# Patient Record
Sex: Female | Born: 1965 | Race: Black or African American | Hispanic: No | Marital: Married | Smoking: Former smoker
Health system: Southern US, Community
[De-identification: ages and names within clinical notes are randomized; demographics above are authoritative.]

## PROBLEM LIST (undated history)

## (undated) DIAGNOSIS — D649 Anemia, unspecified: Secondary | ICD-10-CM

## (undated) DIAGNOSIS — Z803 Family history of malignant neoplasm of breast: Secondary | ICD-10-CM

## (undated) DIAGNOSIS — E785 Hyperlipidemia, unspecified: Secondary | ICD-10-CM

## (undated) DIAGNOSIS — IMO0002 Reserved for concepts with insufficient information to code with codable children: Secondary | ICD-10-CM

## (undated) DIAGNOSIS — Z9289 Personal history of other medical treatment: Secondary | ICD-10-CM

## (undated) DIAGNOSIS — Z801 Family history of malignant neoplasm of trachea, bronchus and lung: Secondary | ICD-10-CM

## (undated) DIAGNOSIS — R7303 Prediabetes: Secondary | ICD-10-CM

## (undated) DIAGNOSIS — D259 Leiomyoma of uterus, unspecified: Secondary | ICD-10-CM

## (undated) DIAGNOSIS — Z01419 Encounter for gynecological examination (general) (routine) without abnormal findings: Secondary | ICD-10-CM

## (undated) DIAGNOSIS — Z86010 Personal history of colonic polyps: Secondary | ICD-10-CM

## (undated) HISTORY — DX: Hyperlipidemia, unspecified: E78.5

## (undated) HISTORY — PX: TUBAL LIGATION: SHX77

## (undated) HISTORY — DX: Family history of malignant neoplasm of breast: Z80.3

## (undated) HISTORY — PX: MYOMECTOMY: SHX85

## (undated) HISTORY — PX: BREAST BIOPSY: SHX20

## (undated) HISTORY — PX: TONSILLECTOMY: SUR1361

## (undated) HISTORY — DX: Leiomyoma of uterus, unspecified: D25.9

## (undated) HISTORY — PX: WISDOM TOOTH EXTRACTION: SHX21

## (undated) HISTORY — DX: Prediabetes: R73.03

## (undated) HISTORY — DX: Reserved for concepts with insufficient information to code with codable children: IMO0002

## (undated) HISTORY — DX: Personal history of colonic polyps: Z86.010

## (undated) HISTORY — DX: Personal history of other medical treatment: Z92.89

## (undated) HISTORY — DX: Encounter for gynecological examination (general) (routine) without abnormal findings: Z01.419

## (undated) HISTORY — DX: Family history of malignant neoplasm of trachea, bronchus and lung: Z80.1

## (undated) HISTORY — PX: ECTOPIC PREGNANCY SURGERY: SHX613

---
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
## 2000-01-21 ENCOUNTER — Other Ambulatory Visit: Admission: RE | Admit: 2000-01-21 | Discharge: 2000-01-21 | Payer: Self-pay | Admitting: Internal Medicine

## 2000-05-14 ENCOUNTER — Emergency Department (HOSPITAL_COMMUNITY): Admission: EM | Admit: 2000-05-14 | Discharge: 2000-05-14 | Payer: Self-pay | Admitting: Emergency Medicine

## 2000-05-25 ENCOUNTER — Encounter: Admission: RE | Admit: 2000-05-25 | Discharge: 2000-07-15 | Payer: Self-pay | Admitting: Orthopedic Surgery

## 2000-06-10 ENCOUNTER — Emergency Department (HOSPITAL_COMMUNITY): Admission: EM | Admit: 2000-06-10 | Discharge: 2000-06-10 | Payer: Self-pay | Admitting: Emergency Medicine

## 2001-05-11 ENCOUNTER — Other Ambulatory Visit: Admission: RE | Admit: 2001-05-11 | Discharge: 2001-05-11 | Payer: Self-pay | Admitting: Family Medicine

## 2001-06-22 ENCOUNTER — Other Ambulatory Visit: Admission: RE | Admit: 2001-06-22 | Discharge: 2001-06-22 | Payer: Self-pay | Admitting: Ophthalmology

## 2001-07-01 ENCOUNTER — Ambulatory Visit (HOSPITAL_COMMUNITY): Admission: RE | Admit: 2001-07-01 | Discharge: 2001-07-01 | Payer: Self-pay | Admitting: Family Medicine

## 2001-07-01 ENCOUNTER — Encounter: Payer: Self-pay | Admitting: Family Medicine

## 2002-12-19 ENCOUNTER — Other Ambulatory Visit: Admission: RE | Admit: 2002-12-19 | Discharge: 2002-12-19 | Payer: Self-pay | Admitting: Family Medicine

## 2002-12-22 ENCOUNTER — Encounter: Payer: Self-pay | Admitting: Family Medicine

## 2002-12-22 ENCOUNTER — Encounter: Admission: RE | Admit: 2002-12-22 | Discharge: 2002-12-22 | Payer: Self-pay | Admitting: Family Medicine

## 2002-12-23 ENCOUNTER — Encounter: Payer: Self-pay | Admitting: *Deleted

## 2002-12-23 ENCOUNTER — Emergency Department (HOSPITAL_COMMUNITY): Admission: EM | Admit: 2002-12-23 | Discharge: 2002-12-23 | Payer: Self-pay | Admitting: Emergency Medicine

## 2005-01-05 ENCOUNTER — Emergency Department (HOSPITAL_COMMUNITY): Admission: EM | Admit: 2005-01-05 | Discharge: 2005-01-05 | Payer: Self-pay | Admitting: Emergency Medicine

## 2005-02-11 ENCOUNTER — Ambulatory Visit: Payer: Self-pay | Admitting: Hospitalist

## 2005-06-18 ENCOUNTER — Ambulatory Visit: Payer: Self-pay | Admitting: Internal Medicine

## 2005-08-19 ENCOUNTER — Ambulatory Visit: Payer: Self-pay | Admitting: Hospitalist

## 2005-09-01 ENCOUNTER — Ambulatory Visit (HOSPITAL_COMMUNITY): Admission: RE | Admit: 2005-09-01 | Discharge: 2005-09-01 | Payer: Self-pay | Admitting: Internal Medicine

## 2005-11-05 ENCOUNTER — Ambulatory Visit: Payer: Self-pay | Admitting: Obstetrics and Gynecology

## 2007-03-13 ENCOUNTER — Emergency Department (HOSPITAL_COMMUNITY): Admission: EM | Admit: 2007-03-13 | Discharge: 2007-03-13 | Payer: Self-pay | Admitting: Emergency Medicine

## 2007-05-13 DIAGNOSIS — Z9289 Personal history of other medical treatment: Secondary | ICD-10-CM

## 2007-05-13 HISTORY — DX: Personal history of other medical treatment: Z92.89

## 2007-08-17 ENCOUNTER — Emergency Department (HOSPITAL_COMMUNITY): Admission: EM | Admit: 2007-08-17 | Discharge: 2007-08-17 | Payer: Self-pay | Admitting: Emergency Medicine

## 2008-07-27 ENCOUNTER — Inpatient Hospital Stay (HOSPITAL_COMMUNITY): Admission: EM | Admit: 2008-07-27 | Discharge: 2008-07-28 | Payer: Self-pay | Admitting: Emergency Medicine

## 2008-08-17 ENCOUNTER — Ambulatory Visit: Payer: Self-pay | Admitting: Obstetrics and Gynecology

## 2008-08-17 ENCOUNTER — Other Ambulatory Visit: Admission: RE | Admit: 2008-08-17 | Discharge: 2008-08-17 | Payer: Self-pay | Admitting: Obstetrics and Gynecology

## 2008-08-17 ENCOUNTER — Encounter (INDEPENDENT_AMBULATORY_CARE_PROVIDER_SITE_OTHER): Payer: Self-pay | Admitting: Family Medicine

## 2008-08-17 LAB — CONVERTED CEMR LAB: RBC: 5.44 M/uL — ABNORMAL HIGH (ref 3.87–5.11)

## 2008-09-20 ENCOUNTER — Ambulatory Visit: Payer: Self-pay | Admitting: Obstetrics & Gynecology

## 2008-12-01 ENCOUNTER — Ambulatory Visit (HOSPITAL_COMMUNITY): Admission: RE | Admit: 2008-12-01 | Discharge: 2008-12-01 | Payer: Self-pay | Admitting: Obstetrics and Gynecology

## 2008-12-01 ENCOUNTER — Encounter (INDEPENDENT_AMBULATORY_CARE_PROVIDER_SITE_OTHER): Payer: Self-pay | Admitting: Obstetrics and Gynecology

## 2009-10-17 ENCOUNTER — Emergency Department (HOSPITAL_COMMUNITY): Admission: EM | Admit: 2009-10-17 | Discharge: 2009-10-17 | Payer: Self-pay | Admitting: Emergency Medicine

## 2009-11-30 ENCOUNTER — Emergency Department (HOSPITAL_COMMUNITY): Admission: EM | Admit: 2009-11-30 | Discharge: 2009-11-30 | Payer: Self-pay | Admitting: Family Medicine

## 2010-04-17 ENCOUNTER — Ambulatory Visit (HOSPITAL_COMMUNITY)
Admission: RE | Admit: 2010-04-17 | Discharge: 2010-04-17 | Payer: Self-pay | Source: Home / Self Care | Attending: Family Medicine | Admitting: Family Medicine

## 2010-08-18 LAB — CBC
HCT: 34.9 % — ABNORMAL LOW (ref 36.0–46.0)
Hemoglobin: 11.9 g/dL — ABNORMAL LOW (ref 12.0–15.0)
MCHC: 34.1 g/dL (ref 30.0–36.0)
MCV: 84.1 fL (ref 78.0–100.0)
Platelets: 281 K/uL (ref 150–400)
RBC: 4.15 MIL/uL (ref 3.87–5.11)
RDW: 13.4 % (ref 11.5–15.5)
WBC: 7.6 K/uL (ref 4.0–10.5)

## 2010-08-21 LAB — POCT PREGNANCY, URINE: Preg Test, Ur: NEGATIVE

## 2010-08-22 LAB — CROSSMATCH: Antibody Screen: NEGATIVE

## 2010-08-22 LAB — COMPREHENSIVE METABOLIC PANEL
ALT: 14 U/L (ref 0–35)
AST: 21 U/L (ref 0–37)
Albumin: 3.7 g/dL (ref 3.5–5.2)
Chloride: 103 mEq/L (ref 96–112)
Creatinine, Ser: 0.59 mg/dL (ref 0.4–1.2)
GFR calc non Af Amer: >60 mL/min (ref >60)
Total Bilirubin: 0.3 mg/dL (ref 0.3–1.2)
Total Protein: 6.7 g/dL (ref 6.0–8.3)

## 2010-08-22 LAB — CBC
HCT: 21.9 % — ABNORMAL LOW (ref 36.0–46.0)
HCT: 27.9 % — ABNORMAL LOW (ref 36.0–46.0)
MCHC: 29.3 g/dL — ABNORMAL LOW (ref 30.0–36.0)
MCHC: 30.8 g/dL (ref 30.0–36.0)
MCHC: 31.3 g/dL (ref 30.0–36.0)
MCV: 61.8 fL — ABNORMAL LOW (ref 78.0–100.0)
MCV: 64.9 fL — ABNORMAL LOW (ref 78.0–100.0)
Platelets: 296 10*3/uL (ref 150–400)
Platelets: 330 10*3/uL (ref 150–400)
Platelets: 378 10*3/uL (ref 150–400)
RBC: 4.26 MIL/uL (ref 3.87–5.11)
RDW: 36.2 % — ABNORMAL HIGH (ref 11.5–15.5)
WBC: 8 10*3/uL (ref 4.0–10.5)

## 2010-08-22 LAB — DIFFERENTIAL
Basophils Absolute: 0 10*3/uL (ref 0.0–0.1)
Basophils Relative: 0 % (ref 0–1)
Eosinophils Absolute: 0.2 10*3/uL (ref 0.0–0.7)
Lymphocytes Relative: 37 % (ref 12–46)
Lymphs Abs: 3 10*3/uL (ref 0.7–4.0)
Monocytes Relative: 8 % (ref 3–12)

## 2010-08-22 LAB — PROTIME-INR
INR: 1 (ref 0.00–1.49)
Prothrombin Time: 13.5 seconds (ref 11.6–15.2)

## 2010-08-22 LAB — POCT URINALYSIS DIP (DEVICE)
Bilirubin Urine: NEGATIVE
Ketones, ur: NEGATIVE mg/dL
Protein, ur: >=300 mg/dL — AB
Specific Gravity, Urine: >=1.03 (ref 1.005–1.030)
Urobilinogen, UA: 1 mg/dL (ref 0.0–1.0)
pH: 6.5 (ref 5.0–8.0)

## 2010-08-22 LAB — URINE MICROSCOPIC-ADD ON

## 2010-08-22 LAB — URINALYSIS, ROUTINE W REFLEX MICROSCOPIC
Bilirubin Urine: NEGATIVE
Ketones, ur: NEGATIVE mg/dL
Nitrite: NEGATIVE
pH: 6.5 (ref 5.0–8.0)

## 2010-08-22 LAB — ABO/RH: ABO/RH(D): O POS

## 2010-08-22 LAB — LIPASE, BLOOD: Lipase: 26 U/L (ref 11–59)

## 2010-08-22 LAB — RETICULOCYTES
Retic Count, Absolute: 99.5 10*3/uL (ref 19.0–186.0)
Retic Ct Pct: 2.5 % (ref 0.4–3.1)

## 2010-08-22 LAB — URINE CULTURE

## 2010-08-22 LAB — POCT PREGNANCY, URINE: Preg Test, Ur: NEGATIVE

## 2010-08-22 LAB — APTT: aPTT: 28 seconds (ref 24–37)

## 2010-09-09 ENCOUNTER — Emergency Department (HOSPITAL_COMMUNITY)
Admission: EM | Admit: 2010-09-09 | Discharge: 2010-09-09 | Disposition: A | Discharge status: Home / Self Care | Payer: Self-pay | Attending: Emergency Medicine | Admitting: Emergency Medicine

## 2010-09-09 ENCOUNTER — Emergency Department (HOSPITAL_COMMUNITY): Payer: Self-pay

## 2010-09-09 DIAGNOSIS — M25519 Pain in unspecified shoulder: Secondary | ICD-10-CM | POA: Insufficient documentation

## 2010-09-09 DIAGNOSIS — M62838 Other muscle spasm: Secondary | ICD-10-CM | POA: Insufficient documentation

## 2010-09-09 DIAGNOSIS — M79609 Pain in unspecified limb: Secondary | ICD-10-CM | POA: Insufficient documentation

## 2010-09-09 DIAGNOSIS — R079 Chest pain, unspecified: Secondary | ICD-10-CM | POA: Insufficient documentation

## 2010-09-09 LAB — DIFFERENTIAL
Basophils Absolute: 0 10*3/uL (ref 0.0–0.1)
Basophils Relative: 0 % (ref 0–1)
Eosinophils Absolute: 0.3 10*3/uL (ref 0.0–0.7)
Eosinophils Relative: 3 % (ref 0–5)

## 2010-09-09 LAB — BASIC METABOLIC PANEL
BUN: 12 mg/dL (ref 6–23)
CO2: 24 mEq/L (ref 19–32)
Calcium: 8.4 mg/dL (ref 8.4–10.5)
GFR calc non Af Amer: >60 mL/min (ref >60)
Glucose, Bld: 119 mg/dL — ABNORMAL HIGH (ref 70–99)
Potassium: 3.8 mEq/L (ref 3.5–5.1)

## 2010-09-09 LAB — CBC
MCHC: 30.7 g/dL (ref 30.0–36.0)
Platelets: 336 10*3/uL (ref 150–400)
RDW: 17.9 % — ABNORMAL HIGH (ref 11.5–15.5)
WBC: 8.7 10*3/uL (ref 4.0–10.5)

## 2010-09-09 LAB — POCT CARDIAC MARKERS
CKMB, poc: <1 ng/mL — ABNORMAL LOW (ref 1.0–8.0)
Myoglobin, poc: 38.1 ng/mL (ref 12–200)

## 2010-09-24 NOTE — Discharge Summary (Signed)
[name], [name]                ACCOUNT NO.:  [id number]   MEDICAL RECORD NO.:  [id number]          PATIENT TYPE:  INP   LOCATION:  5118                         FACILITY:  [facility]   PHYSICIAN:  Eduard Clos, MDDATE OF BIRTH:  [birth year]   DATE OF ADMISSION:  07/26/2008  DATE OF DISCHARGE:  07/28/2008                               DISCHARGE SUMMARY   HISTORY OF PRESENT ILLNESS:  A 45 year old female with known history of  uterine fibroid, menorrhagia, and abdominal cramps.  The patient was  found to have very low hemoglobin of 6.4 with a hematocrit of 21.9.  The  patient was transfused 3 units of blood during stay.  At the time of  discharge, her hemoglobin was 8.7, hematocrit of 27.9.  The patient had  a CAT scan of abdomen and pelvis, and pelvic ultrasound all of which  were consistent with the uterine fibroid.  I did discuss with on-call  gynecologist and Dr. Milus Mallick at [facility] who advised that the  patient will not need immediate surgery, but will need definite follow  up and has given a followup date on April 8 at 2:45 p.m.  and Dr. Milus Mallick  also advised that in case the patient has another vaginal bleeding  diathesis like this, she needs to go to the [facility] ER.  I then  discussed this with the patient.  The patient agreed.  At this time, we  will also discharge patient on ferrous sulfate and tramadol.  Ferrous  sulfate for a possible iron deficiency due to anemia and tramadol for  pain.  At the time of this dictation, the patient is hemodynamically  stable.  The patient's bleeding has stopped at this time.   PROCEDURES DONE DURING THIS STAY:  CT of the abdomen and pelvis on July 26, 2008, shows no acute abdominal findings; mass lesions; or  adenopathy; multiple low-attenuation liver lesions consistent with  benign hepatic cyst; no renal calculi; enlarged uterus with multiple  fibroids; abnormal endometrium, this may be due to the multiple  fibroids;  alopecia; rectal polyps.  Pelvic ultrasound may be helpful.  left ovary, no ureteral or bladder calculi.  Normal apex.  Transvaginal  and pelvic ultrasound on July 27, 2008, shows heterogenous enlarged  uterus with multiple intramural and submucous fibroids, lower uterine  segment, endometrial cavity oval, 3 cm mass suspicious for endometrial  polyp with pedunculated fibroid.  This may be better visualized by  sonohysterography a small left ovarian cyst.   PERTINENT LABS:  Hemoglobin on admission was 6.4, at discharge was 8.7.   MEDICATION AT DISCHARGE:  1. Ferrous sulfate 325 mg p.o. 3 times daily.  2. Tramadol 50 mg p.o. t.i.d. p.r.n. pain.   FINAL DIAGNOSES:  1. 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  2. History of normal pap smear.  3. Menorrhagia.   PLAN:  The patient advised to follow up with [facility] on April 8  at 2:45 p.m.  In the event of any bleeding diathesis, the patient  advised to go to the ER particularly at [facility].  The  patient to follow up with [facility] as scheduled.      Eduard Clos, MD  Electronically Signed     ANK/MEDQ  D:  07/28/2008  T:  07/29/2008  Job:  [id number]

## 2010-09-24 NOTE — Group Therapy Note (Signed)
[name], [name] NO.:  [id number]   MEDICAL RECORD NO.:  [id number]          PATIENT TYPE:  WOC   LOCATION:  [facility]                   FACILITY:  [facility]   PHYSICIAN:  Scheryl Darter, MD       DATE OF BIRTH:  [birth year]   DATE OF SERVICE:  08/17/2008                                  CLINIC NOTE   REASON FOR VISIT:  Evaluation for dysfunctional uterine bleeding.   HISTORY OF PRESENT ILLNESS:  [name] is a 45 year old  gravida 4, para 2-0-2-2 who recently was admitted at [facility]  secondary to iron-deficient anemia with a hemoglobin of 6.4 which was  determined to be related to dysfunctional uterine bleeding and uterine  fibroids.  CT of the abdomen and pelvis as well as pelvic ultrasound  revealed an enlarged uterus at 15 x 8 x 8 cm with multiple fibroids, the  largest 4.4 cm in diameter.  These were intramural and submucosal  fibroids.  The endometrium was 7 mm and there is endometrial cavity  solid oval mass suspicious for a polyp or pedunculated fibroid.  The  ovaries were normal.  The left ovary contains a small dominant follicle.  The patient received transfusion 2 units of blood while at [facility]  and left with a hemoglobin of 9.   HISTORY:  The patient relates having mild cramping with her menstrual  cycles which she describes as normal.  However, she also relates 4-5  days of using six to seven pads a day which she changes and they are  moderately full of blood.  She denies any bleeding in between periods,  any significant abdominal pain.  No fevers or chills and she relates  that her cycles do occur regularly every month.   PAST MEDICAL HISTORY:  None.   PAST SURGICAL HISTORY:  For an ectopic pregnancy.   SOCIAL HISTORY:  She is a smoker.  She is not taking any medications and  the patient relates that aspirin gives her gastric irritation.  She  denies any rash or other reaction to the aspirin.   REVIEW OF SYSTEMS:   Negative other than her history of present illness.   PHYSICAL EXAM:  VITAL SIGNS:  Were reviewed and are normal.  She is  healthy-appearing, no acute distress.  ABDOMEN:  Soft, nontender.  External genitalia are normal.  Vaginal mucosa is normal.  Cervix is  multiparous appearing.   ASSESSMENT/PLAN:  Dysfunctional uterine bleeding likely secondary to  uterine fibroids.  Given the marked drop in her hemoglobin and the  ultrasound findings, recommended to the patient that an endometrial  biopsy be performed.  An informed consent for this procedure was  obtained and is on the chart.  Time-out was conducted.  The cervix was  visualized with speculum and prepped with Betadine.  It was grasped with  a tenaculum and the uterus was sounded to approximately 7 cm.  The  endometrial biopsy pipette was inserted to that distance and appropriate  biopsy was obtained.  The patient tolerated the procedure well.  Blood  loss was minimal.  The  CBC will be done today.  The patient will return  in 2 weeks for discussion of results.  I did offer the patient a Depo  shot today or  potentially even placement of an IUD.  However, the patient declines at  this time and wishes to see how her bleeding continues as well as see  how her hemoglobin holds up  over time.     ______________________________  Odie Sera, D.O.    ______________________________  Scheryl Darter, MD    MC/MEDQ  D:  08/17/2008  T:  08/17/2008  Job:  [id number]

## 2010-09-24 NOTE — H&P (Signed)
[name], Amy                ACCOUNT NO.:  [id number]   MEDICAL RECORD NO.:  [id number]          PATIENT TYPE:  EMS   LOCATION:  [facility]                         FACILITY:  [facility]   PHYSICIAN:  Lonia Blood, M.D.DATE OF BIRTH:  1966-04-11   DATE OF ADMISSION:  07/26/2008  DATE OF DISCHARGE:                              HISTORY & PHYSICAL   PRIMARY CARE PHYSICIAN:  Unassigned.   CHIEF COMPLAINT:  Abdominal pain/cramps.   HISTORY OF PRESENT ILLNESS:  [name] is very pleasant 45-year-  old female who lives in the [place] area.  She does not receive  regular medical care.  She presented to Urgent Care today and was  ultimately transferred to [facility] with complaints of severe  abdominal cramps.  Of note, the patient has a known history of chronic  microcytic anemia due to menorrhagia.  In the past, she was referred to  have an evaluation for possible fibroids, but computerized notes suggest  that there was little suspicion at the time on behalf of the evaluating  physician for unclear reasons.  Ultrasound accomplished in August 2004,  did, however, confirm uterine fibroids.  The patient admits that she has  significant heavy bleeding, at least once a month and at times she will  have bleeding twice a month.  Each episode of bleeding typically last  for 5 days.  She has noticed over the last 2 weeks that she has had  decreased exertional tolerance.  She has felt much more tired.  She is  not, however, had orthostatic symptoms.  She recently finished a  menstrual period on Monday of this week, (it is presently Wednesday).  She noticed that her abdominal cramps with this period were much more  severe than her typical periods.  On Tuesday, she had no cramps, but her  cramps returned today while at work.  These were described as severe  deep cramping pains that shot down through the vagina and into her  legs.  As the day progressed, the pains also shot up into  her abdomen.  The patient denies vaginal discharge.  She denies hematochezia or  melena.  She reports some nausea with these pains, but denies vomiting.  There has been no recent abdominal trauma.  There has been no change in  medications as the patient is on no medications.  There has been no  diarrhea or sick contacts.  There have been no fevers or chills.  On  presentation to the Urgent Care, the patient was found to have a  hemoglobin of 6.4 and was transferred to [facility] for further  evaluation.   REVIEW OF SYSTEMS:  Comprehensive review of systems is unremarkable with  the exception of the multiple positive elements noted in his history of  present illness above.   PAST MEDICAL HISTORY:  1. Abnormal Pap smear, status post cryotherapy multiple years ago.  2. Uterine fibroids noted on ultrasound in August 2004 - the patient      was not aware of this diagnosis.  3. Status post bilateral tubal ligation.  4. History of  ectopic tubal pregnancy, status post surgical excision.  5. Tobacco abuse to a peak of approximately one pack per day since age      of 56 - down to less than a quarter of a pack a day of present      time.  6. History of chronic microcytic anemia, previously instructed to take      iron.  7. Menorrhagia.   OUTPATIENT MEDICATIONS:  None.   ALLERGIES:  ASPIRIN CAUSES GI UPSET.   FAMILY HISTORY:  The patient's mother has diabetes.  Otherwise, the  history is noncontributory.   SOCIAL HISTORY:  The patient occasionally partakes of alcohol, but not  to excess.  She is engaged.  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   DATA REVIEW:  Urine pregnancy test is negative.  Urinalysis reveals  large hemoglobin and greater than 300 protein.  White count is normal,  platelet count is normal.  Hemoglobin is 6.4 with an MCV of 55.  Sodium,  potassium chloride, bicarb, BUN and creatinine and glucose are normal.  Calcium is normal.  LFTs are all normal.   Albumin is 3.7, lipase is 26.  Coags have not been checked.  CT scan of the abdomen and pelvis reveals  no acute abdominal findings with no renal calculi and multiple hepatic  cysts that appear to be benign.  There are multiple uterine fibroids on  CT scan of the pelvis and also an abnormal endometrium with suggestion  for further evaluation via pelvic ultrasound.  It is common and the  appendix is normal.   PHYSICAL EXAMINATION:  VITAL SIGNS:  Temperature 98.1, blood pressure  116/63, heart rate 68, respiratory 16.  O2 saturation 100% on room air.  GENERAL:  A well-developed, overweight female in no acute respiratory  distress.  HEENT:  Normocephalic, atraumatic.  Pupils equal, round and reactive to  light and accommodation.  Extraocular muscles intact.  OC/OP clear.  NECK:  No JVD.  LUNGS:  Clear to auscultation bilaterally without wheezes or rhonchi.  CARDIOVASCULAR:  Regular rate and rhythm without murmur, gallop or rub.  Normal S1-S2.  ABDOMEN:  Obese, soft, nondistended.  Tender to palpation throughout all  four quadrants.  No rebound, no appreciable ascites.  EXTREMITIES:  1+ bilateral lower extremity edema without cyanosis or  clubbing.  NEUROLOGIC:  Alert and oriented x4.  Cranial nerves II-XII intact.  Nonfocal neurologic exam.   IMPRESSION AND PLAN:  1. Subacute/acute blood loss anemia - the clear etiology here appears      to be the patient's menorrhagia.  This may be driven by her      fibroids.  I am also worried about the comment of an abnormal      endometrium on her CT scan of the abdomen.  The patient will be      typed and crossed and we will initiate her treatment with two units      of packed red blood cells.  We will then follow up with a CBC and      consider further transfusion as indicated.  I suspect that the      patient will feel significantly better with simple transfusion.  We      will follow her CBCs on q.6 h., basis.  Once her hemoglobin is at 8       or greater, we will monitor without further transfusion.  An anemia      is being sent to confirm her  iron deficiency, but with an MCV of      55, hemoglobin of 6.4 and a history of menorrhagia, this is      extremely clear.  I will check coags to rule out any coagulopathy      that has previously been undiagnosed.  2. Abdominal cramps - I am concerned these are related to the      patient's fibroids/abnormal endometrium.  These are also likely      playing a role in her blood loss.  Additionally, in a patient with      a history of an abnormal Pap and abnormal endometrium via CT scan      is quite concerning.  We will obtain a pelvic and transvaginal      ultrasound to further evaluate the abnormal endometrium.  We will      consider inpatient gynecology consultation at [facility].  We will      also consider the possibility, however, of transfusing the patient      to the point that she is medically stable and then sending her to      see a gynecologist as an outpatient with an early appointment being      arranged.  The timing of this will be based upon the results of her      ultrasound and her clinical status.  3. Tobacco abuse - at the present time the patient's tobacco use is so      minimal that a nicotine patch is not indicated.  I have encouraged      her to go ahead and discontinue smoking altogether.  I will request      tobacco cessation consultation to help drive this point home.      Lonia Blood, M.D.  Electronically Signed     JTM/MEDQ  D:  07/26/2008  T:  07/27/2008  Job:  [id number]

## 2010-09-24 NOTE — H&P (Signed)
[name], [name]       ACCOUNT NO.:  [id number]   MEDICAL RECORD NO.:  [id number]          PATIENT TYPE:  AMB   LOCATION:  [facility]                           [facility]   PHYSICIAN:  Naima A. Dillard, M.D. DATE OF BIRTH:  July 17, 1965   DATE OF ADMISSION:  DATE OF DISCHARGE:                              HISTORY & PHYSICAL   CHIEF COMPLAINT:  Irregular vaginal bleeding with 3 uterine fibroids, 3  are submucosal.   HISTORY:  The patient is a 45 year old African American female, who  presented to me in June 2010.  [place] Spaniel that she was found to have anemia in  March and was hospitalized and given 2 units of packed red blood cells.  States her last hemoglobin was 10.0.  She has irregular spotting and  vaginal bleeding, and had ultrasound which was significant for uterus  measuring 15.2 x 8.7 x 8.2 with a questionable endometrial polyp with  several fibroids.  The patient was seen by faculty practice and had an  endometrial biopsy and was given Provera.  The Provera made her sick and  endometrial biopsy was benign.   PAST MEDICAL HISTORY:  Significant for anemia and above.   MEDICATIONS:  Iron pill.   ALLERGIES:  The patient is allergic to ASPIRIN.   PAST SURGICAL HISTORY:  Significant for exploratory laparotomy and  salpingectomy secondary to tubal pregnancy in 1990, and she had elective  abortion in the past.   SOCIAL HISTORY:  The patient smokes 3-4 cigarettes a day and occasional  alcohol.  No illicit drug use.   FAMILY HISTORY:  Significant for thyroid disease, cancer, and diabetes.   PHYSICAL EXAMINATION:  VITAL SIGNS:  The patient weighs 185 pounds,  pulse is 64, blood pressure is 114/70.  HEENT:  Pupils are equal.  Hearing is normal.  Throat is clear.  Thyroid  is not enlarged.  HEART:  Regular rate and rhythm.  LUNGS:  Clear to auscultation bilaterally.  BREASTS:  No masses, discharge, skin changes, or nipple retractions  bilaterally.  BACK:  No CVA  tenderness.  ABDOMEN:  Nontender without mass or organomegaly.  EXTREMITIES:  No cyanosis, clubbing, or edema.  NEUROLOGIC:  Within normal limits.  VAGINAL:  Within normal limits.  Cervix is nontender without any  lesions.  Uterus is 13 weeks size and irregular.  Adnexa has no masses.  The patient had a sonohystogram which showed her uterus to measure 7.4 x  11.4 x 8.3 with several fibroids, and on the ultrasound, there were 3  submucosal fibroids seen measuring 2.37 x 2.49, 2.6 x 2.5, and 2.7 x  2.4.   ASSESSMENT:  Uterine fibroids and anemia.  All treatment for fibroids  were reviewed with the patient.  The patient desires to try hysteroscopy  in hopes that the submucosal fibroids are the cause of her irregular  bleeding and anemia.  She understands that she has other uterine  fibroids and this may not work.  She also understands that she may need  more than 1 surgery because sometimes we are unable to remove all the  fibroids in one surgery.  She understands the risk of  the surgery to be  but not limited to bleeding, infection, damage to internal organs such  as bowel, bladder, major blood vessels.  She understands that she can do  observation, uterine artery embolization, hysterectomy, myomectomy, and  Lupron.  The patient has decided to proceed.      Naima A. Normand Sloop, M.D.  Electronically Signed     NAD/MEDQ  D:  11/29/2008  T:  11/30/2008  Job:  [id number]

## 2010-09-24 NOTE — Op Note (Signed)
NAMECAYLI, [name]       ACCOUNT NO.:  [id number]   MEDICAL RECORD NO.:  [id number]          PATIENT TYPE:  AMB   LOCATION:  [facility]                           [facility]   PHYSICIAN:  Naima A. Dillard, M.D. DATE OF BIRTH:  17-Mar-1966   DATE OF PROCEDURE:  12/01/2008  DATE OF DISCHARGE:  12/01/2008                               OPERATIVE REPORT   PREOPERATIVE DIAGNOSES:  Symptomatic uterine fibroids and anemia.   POSTOPERATIVE DIAGNOSES:  Symptomatic uterine fibroids and anemia with 2  submucosal fibrous tissue.   PROCEDURE:  Dilation and curettage, hysteroscopy, removal of submucosal  fibroids.   SURGEON:  Naima A. Dillard, MD   ASSISTANTS:  None.   ANESTHESIA:  General laryngeal mask airway.   FINDINGS:  A 2 large anterior submucosal fibroids.  The uterus under  pelvic exam was about 14 weeks' size and mobile.  No adnexal masses are  palpated.  Endometrial curettings and fibroids sent to Pathology.  There  were no polyps seen.  No other fibroids seen.   ESTIMATED BLOOD LOSS:  Minimal.   NORMAL SALINE DEFICIT:  493 mL.   COMPLICATIONS:  None.   The patient went to PACU in stable condition.   PROCEDURE IN DETAIL:  The patient was taken to the operating room where  she was given general anesthesia, and placed in dorsal lithotomy  position, and prepped and draped in a normal sterile fashion.  A red  rubber catheter was placed into the bladder and the bladder was drained.  A bivalve speculum was placed into the vagina.  The anterior lip of the  cervix was grasped with a single-tooth tenaculum.  After her exam under  anesthesia, the cervix was dilated up to 31.  The hysteroscope was  placed into the uterine cavity using the loop and we could see 1 large  submucosal fibroid.  I then cut some chips back to sent to Pathology and  then tried to desiccate the rest of the fibroid, but loop did not  desiccate the fibroid barely even desiccate any tissue.  So then we  switched to the razor which still desiccate some tissue, but not much as  well as I switched back to the loop and then just removed the each part  of fibroid chip by chip as I could and then I went in and removed the  hysteroscope, went in with the polyp forceps and was able to remove the  remainder of the fibroid.  The second fibroid was done in a similar  fashion.  At the end of the case, the chips started again and we tried  to removed as many chips as I could, but the visualization was obscured  and I could not see all of the second fibroid to be removed, so I say  about a quarter of the fibroid was less because of visualization issue I  stopped.  There was some bleeding noted in the anterior wall on the  patient's right side which was hemostatic with the Bovie coag.  No  perforations were noted.  No more fibroids or polyps were seen.  No  other pathology was seen.  The hysteroscope was then removed.  A sharp  curettage was then done and a scant amount of endometriosis, curettings  were obtained.  I then looked again with the hysteroscope, there was no  heavy bleeding.  All instruments were removed from the abdomen.  Sponge,  lap, and needle counts were correct.  The VersaPoint was there and he  cannot understand why the tissue would not desiccate either.  The  tenaculum site was made hemostatic with pressure and silver nitrate.  Sponge, lap, and needles counts were correct.  The patient went to  recovery room in stable condition.      Naima A. Normand Sloop, M.D.  Electronically Signed     NAD/MEDQ  D:  12/01/2008  T:  12/02/2008  Job:  [id number]

## 2010-09-27 NOTE — Group Therapy Note (Signed)
NAMEGIORGIA, WAHLER              ACCOUNT NO.:  [id number]   MEDICAL RECORD NO.:  [id number]          PATIENT TYPE:  WOC   LOCATION:  [facility]                   FACILITY:  [facility]   PHYSICIAN:  Argentina Donovan, MD        DATE OF BIRTH:  1966/01/21   DATE OF SERVICE:                                    CLINIC NOTE   CHIEF COMPLAINT:  Anemia.   SUBJECTIVE:  [name] is a 45 year old African-American female, new  patient to our clinic, who was referred from the outpatient clinic, internal  medicine, at [facility] for chronic microcytic anemia workup to  rule out possible fibroids.  The patient does not remember her hemoglobin  level and said that she feels tired sometimes.  Denies any palpitations,  chest pain, or shortness of breath.   PAST MEDICAL HISTORY:  Anemia.   MEDICATIONS:  Iron sulfate, which the patient takes already.   ALLERGIES:  ASPIRIN.   History of ulcer.  __________ .   GYNECOLOGIC HISTORY:  Patient's menarche was at 45 years old.  Her last  menstrual period was June 18th.  She usually has regular cycles lasting five  days without any bleeding between.  During days 1-2, patient wears five  pads, regular flow, and on days 3-5, may wear 3-4 pads, regular flow also.  She may have mild cramping with periods.  She is currently sexually active.  Does not use any contraception because she has had a bilateral tubal  ligation.  She is G4, para 2, with one miscarriage and one ectopic  pregnancy.  Her last pregnancy was 15 years ago.  Two spontaneous vaginal  deliveries.  Also a history of yearly Pap smear with abnormal Pap smear 20  years ago.  Did require cryotherapy.  Last Pap smear in April, 2006, normal.  Last mammogram three weeks ago, which was normal.   PAST SURGICAL HISTORY:  Ectopic pregnancy in 1991 and bilateral tubal  ligation.   FAMILY HISTORY:  Mother with diabetes.  Grandmother with hypertension.   SOCIAL HISTORY:  The patient has smoked since  she was 45 years old.  Has  been cutting down to one pack per week.  She drinks alcohol socially.  No  caffeinated beverage and no drug use.  She has never been sexually or  physically abused.   LABORATORY DATA:  From outpatient records, August 19, 2005 shows hemoglobin  8.1, MCV 65.5, RDW 18.9, iron 11 with a total iron-binding capacity  decreased and iron percent sat 3, ferritin 2.   PHYSICAL EXAMINATION:  VITAL SIGNS:  Blood pressure 101/67, pulse 74,  temperature 98.3.  Weight 156 pounds __________ .  Height 5.5-3/4 inch.  GENERAL:  Patient was in no acute distress.  HEENT:  Normal colored conjunctivae.  LUNGS:  Clear to auscultation bilaterally.  CARDIOVASCULAR:  S1 and S2 normal.  No murmurs, rubs or gallops.  ABDOMEN:  Positive bowel sounds.  Nontender.  Nondistended.  GENITOURINARY:  __________  entirely within normal limits.  Bimanual  examination shows normal uterus size.  No cervical motion tenderness.  No  uterine or adnexal masses  palpable.   ASSESSMENT:  1.  Chronic microcytic anemia, iron deficiency.  At this point, since we got      the recent blood work that shows iron deficiency etiology, I will      strongly recommend the patient to follow iron sulfate therapy.  I will      repeat CBC today and check reticulocyte count.  Patient should take her      sulfate 625 mg b.i.d. for two months.  We will recheck CBC and retic      count in six weeks and discuss with the patient at that point.  Also, we      will check Hemoccult stools and peripheral blood smear, which has not      been checked yet, in the outpatient clinic.  2.  Patient has regular periods.  We do not think she has any fibroids, so      at this point, we defer any further evaluation on this __________ .     ______________________________  Argentina Donovan, MD    ______________________________  Argentina Donovan, MD    PR/MEDQ  D:  11/05/2005  T:  11/05/2005  Job:  [id number]

## 2011-04-10 ENCOUNTER — Other Ambulatory Visit (HOSPITAL_COMMUNITY): Payer: Self-pay | Admitting: Family Medicine

## 2011-04-30 ENCOUNTER — Other Ambulatory Visit (HOSPITAL_COMMUNITY): Payer: Self-pay | Admitting: Family Medicine

## 2011-04-30 DIAGNOSIS — Z1231 Encounter for screening mammogram for malignant neoplasm of breast: Secondary | ICD-10-CM

## 2011-05-12 ENCOUNTER — Emergency Department (HOSPITAL_COMMUNITY): Payer: Self-pay

## 2011-05-12 ENCOUNTER — Encounter: Payer: Self-pay | Admitting: *Deleted

## 2011-05-12 ENCOUNTER — Emergency Department (HOSPITAL_COMMUNITY)
Admission: EM | Admit: 2011-05-12 | Discharge: 2011-05-13 | Disposition: A | Discharge status: Home / Self Care | Payer: Self-pay | Attending: Emergency Medicine | Admitting: Emergency Medicine

## 2011-05-12 ENCOUNTER — Other Ambulatory Visit: Payer: Self-pay

## 2011-05-12 DIAGNOSIS — R51 Headache: Secondary | ICD-10-CM | POA: Insufficient documentation

## 2011-05-12 DIAGNOSIS — F17200 Nicotine dependence, unspecified, uncomplicated: Secondary | ICD-10-CM | POA: Insufficient documentation

## 2011-05-12 DIAGNOSIS — D649 Anemia, unspecified: Secondary | ICD-10-CM | POA: Insufficient documentation

## 2011-05-12 DIAGNOSIS — R0602 Shortness of breath: Secondary | ICD-10-CM | POA: Insufficient documentation

## 2011-05-12 DIAGNOSIS — R079 Chest pain, unspecified: Secondary | ICD-10-CM | POA: Insufficient documentation

## 2011-05-12 LAB — BASIC METABOLIC PANEL
BUN: 11 mg/dL (ref 6–23)
Chloride: 102 mEq/L (ref 96–112)
Creatinine, Ser: 0.64 mg/dL (ref 0.50–1.10)
GFR calc Af Amer: >90 mL/min (ref >90)
Glucose, Bld: 76 mg/dL (ref 70–99)
Potassium: 3.5 mEq/L (ref 3.5–5.1)

## 2011-05-12 LAB — CARDIAC PANEL(CRET KIN+CKTOT+MB+TROPI)
Relative Index: 1 (ref 0.0–2.5)
Troponin I: <0.3 ng/mL (ref <0.30)

## 2011-05-12 LAB — CBC
HCT: 32.3 % — ABNORMAL LOW (ref 36.0–46.0)
Hemoglobin: 10.2 g/dL — ABNORMAL LOW (ref 12.0–15.0)
MCV: 73.7 fL — ABNORMAL LOW (ref 78.0–100.0)
RDW: 13.9 % (ref 11.5–15.5)
WBC: 8.6 10*3/uL (ref 4.0–10.5)

## 2011-05-12 MED ORDER — OXYCODONE-ACETAMINOPHEN 5-325 MG PO TABS
1.0000 | ORAL_TABLET | Freq: Once | ORAL | Status: AC
  Administered 2011-05-12: 1 via ORAL
  Filled 2011-05-12: qty 1

## 2011-05-12 MED ORDER — OXYCODONE-ACETAMINOPHEN 5-325 MG PO TABS: 2.0000 | ORAL_TABLET | ORAL | Status: AC | PRN

## 2011-05-12 NOTE — ED Provider Notes (Addendum)
History     CSN: [id number]  Arrival date & time 05/12/11  7829   First MD Initiated Contact with Patient 05/12/11 2200      Chief Complaint  Patient presents with  . Headache  . Chest Pain    worse when lying flat or changing position   patient presents with persistent sharp pain in the mid chest area over the past 24-36 hours. She denies any change in pain with eating. She did eat this morning. She's had minimal if any shortness of breath. No nausea, vomiting. No cough or fever. No back or abdominal pain. Denies any leg pain or swelling. She takes Tylenol for the chest pain, which helped somewhat. She denies any previous cardiac history or any family history of premature heart disease. Denies any change of pain with exertion. It is worse with palpation or certain positions. Patient also states she has had a chronic headache for 2-3 weeks however, if the headache. She states usually is helped with Tylenol. She has had no neurologic symptoms. Denies any dizziness, vomiting, numbness, tingling, or weakness, ataxia.  No neck pain or fever.   Patient was apparently seen last April for similar symptoms and had normal testing at that time.  Denies any anxiety or stressors. Denies any pleuritic pain or any recent surgeries or procedures.  (Consider location/radiation/quality/duration/timing/severity/associated sxs/prior treatment) HPI  No past medical history on file.  Past Surgical History  Procedure Date  . Myomectomy   . Ectopic pregnancy surgery     No family history on file.  History  Substance Use Topics  . Smoking status: Current Everyday Smoker -- 0.5 packs/day    Types: Cigarettes  . Smokeless tobacco: Not on file  . Alcohol Use: Yes    OB History    Grav Para Term Preterm Abortions TAB SAB Ect Mult Living                  Review of Systems  All other systems reviewed and are negative.    Allergies  Aspirin  Home Medications  No current outpatient prescriptions  on file.  BP 120/64  Pulse 69  Temp(Src) 98.8 F (37.1 C) (Oral)  Resp 16  SpO2 99%  LMP 04/19/2011  Physical Exam  Nursing note and vitals reviewed. Constitutional: She is oriented to person, place, and time. She appears well-developed and well-nourished. No distress.  HENT:  Head: Normocephalic and atraumatic.  Mouth/Throat: Oropharynx is clear and moist.  Eyes: Conjunctivae and EOM are normal. Pupils are equal, round, and reactive to light.  Neck: Neck supple. No thyromegaly present.  Cardiovascular: Normal rate and regular rhythm.  Exam reveals no gallop and no friction rub.   No murmur heard. Pulmonary/Chest: Effort normal and breath sounds normal. No respiratory distress. She has no wheezes. She has no rales. She exhibits no tenderness.  Abdominal: Soft. Bowel sounds are normal. She exhibits no distension and no mass. There is no tenderness. There is no rebound and no guarding.  Musculoskeletal: Normal range of motion. She exhibits no edema and no tenderness.  Neurological: She is alert and oriented to person, place, and time. She displays normal reflexes. No cranial nerve deficit. She exhibits normal muscle tone. Coordination normal.  Skin: Skin is warm and dry. No rash noted.  Psychiatric: She has a normal mood and affect.    ED Course  Procedures (including critical care time)  Labs Reviewed  CBC - Abnormal; Notable for the following:    Hemoglobin 10.2 (*)  HCT 32.3 (*)    MCV 73.7 (*)    MCH 23.3 (*)    All other components within normal limits  CARDIAC PANEL(CRET KIN+CKTOT+MB+TROPI) - Abnormal; Notable for the following:    Total CK 255 (*)    All other components within normal limits  BASIC METABOLIC PANEL  POCT PREGNANCY, URINE   Dg Chest 2 View  05/12/2011  *RADIOLOGY REPORT*  Clinical Data: Chest pain.  Tobacco use.  CHEST - 2 VIEW  Comparison: 09/09/2010  Findings: Cardiac and mediastinal contours appear normal.  The lungs appear clear.  No pleural  effusion is identified.  IMPRESSION:  No significant abnormality identified.  Original Report Authenticated By: Dellia Cloud, M.D.     1. Chest pain   2. Anemia       MDM  Pt is seen and examined;  Initial history and physical completed.  Will follow.    Date: 05/12/2011  Rate: 63  Rhythm: normal sinus rhythm  QRS Axis: normal  Intervals: normal  ST/T Wave abnormalities: normal  Conduction Disutrbances:none  Narrative Interpretation:   Old EKG Reviewed: unchanged    11:33 PM  PERC score is Negative.. Low pretest probability, very low clinical suspicion for PE.   Results for orders placed during the hospital encounter of 05/12/11  CBC      Component Value Range   WBC 8.6  4.0 - 10.5 (K/uL)   RBC 4.38  3.87 - 5.11 (MIL/uL)   Hemoglobin 10.2 (*) 12.0 - 15.0 (g/dL)   HCT 16.1 (*) 09.6 - 46.0 (%)   MCV 73.7 (*) 78.0 - 100.0 (fL)   MCH 23.3 (*) 26.0 - 34.0 (pg)   MCHC 31.6  30.0 - 36.0 (g/dL)   RDW 04.5  40.9 - 81.1 (%)   Platelets 350  150 - 400 (K/uL)  BASIC METABOLIC PANEL      Component Value Range   Sodium 137  135 - 145 (mEq/L)   Potassium 3.5  3.5 - 5.1 (mEq/L)   Chloride 102  96 - 112 (mEq/L)   CO2 25  19 - 32 (mEq/L)   Glucose, Bld 76  70 - 99 (mg/dL)   BUN 11  6 - 23 (mg/dL)   Creatinine, Ser 9.14  0.50 - 1.10 (mg/dL)   Calcium 9.0  8.4 - 78.2 (mg/dL)   GFR calc non Af Amer >90  >90 (mL/min)   GFR calc Af Amer >90  >90 (mL/min)  CARDIAC PANEL(CRET KIN+CKTOT+MB+TROPI)      Component Value Range   Total CK 255 (*) 7 - 177 (U/L)   CK, MB 2.6  0.3 - 4.0 (ng/mL)   Troponin I <0.30  <0.30 (ng/mL)   Relative Index 1.0  0.0 - 2.5    Dg Chest 2 View  05/12/2011  *RADIOLOGY REPORT*  Clinical Data: Chest pain.  Tobacco use.  CHEST - 2 VIEW  Comparison: 09/09/2010  Findings: Cardiac and mediastinal contours appear normal.  The lungs appear clear.  No pleural effusion is identified.  IMPRESSION:  No significant abnormality identified.  Original Report  Authenticated By: Dellia Cloud, M.D.        11:33 PM Patient is feeling much better. Chest pain, appears to be  very atypical, reproducible, there is low suspicion for ACS or cardiac etiology. We'll prescribe pain medication and recommend close followup with primary care physician this week. Patient was also encouraged to return to the ED for any concerns or changing symptoms. Initially, urine test was ordered, however,  patient did not want to have that done, and we'll cancel it and discharge her home to     [facility]. Patrica Duel, MD 05/12/11 2334  Lorelle Gibbs. Patrica Duel, MD 05/12/11 2342

## 2011-05-12 NOTE — ED Notes (Signed)
Pt presents with a c/c of chest pain that started last night.  St's she was getting in her car when the pain started, st's she took a tylenol but that didn't help.  St's nothing has helped the pain but movement makes it worse, pain does not radiate.  Pt st's she becomes SOB upon movement, denies any lightheadedness

## 2011-05-12 NOTE — ED Notes (Signed)
Pt reports headache x 2-3 weeks, intermittently. Pt states tylenol eventually helps headache. Pt c/o midchest pain reproducible w/ movement and position change, describes as intermittent and sharp, when lying it is pressure. Pt denies prior hx of same and denies cardiac hx for self and family.

## 2011-05-12 NOTE — ED Notes (Signed)
Light green and lav sent to lab, dark green in mini lab.

## 2011-06-03 ENCOUNTER — Ambulatory Visit (HOSPITAL_COMMUNITY)
Admission: RE | Admit: 2011-06-03 | Discharge: 2011-06-03 | Disposition: A | Discharge status: Home / Self Care | Payer: No Typology Code available for payment source | Source: Ambulatory Visit | Attending: Family Medicine | Admitting: Family Medicine

## 2011-06-03 DIAGNOSIS — Z1231 Encounter for screening mammogram for malignant neoplasm of breast: Secondary | ICD-10-CM

## 2011-11-26 ENCOUNTER — Encounter: Payer: Self-pay | Admitting: Physician Assistant

## 2011-11-26 ENCOUNTER — Ambulatory Visit: Payer: Self-pay | Admitting: Physician Assistant

## 2011-11-26 VITALS — BP 120/68 | HR 70 | Temp 99.0°F | Resp 16 | Ht 66.0 in | Wt 200.4 lb

## 2011-11-26 DIAGNOSIS — J019 Acute sinusitis, unspecified: Secondary | ICD-10-CM

## 2011-11-26 DIAGNOSIS — J029 Acute pharyngitis, unspecified: Secondary | ICD-10-CM

## 2011-11-26 DIAGNOSIS — R05 Cough: Secondary | ICD-10-CM

## 2011-11-26 MED ORDER — IPRATROPIUM BROMIDE 0.03 % NA SOLN: 2.0000 | Freq: Two times a day (BID) | NASAL | Status: DC

## 2011-11-26 MED ORDER — CEFDINIR 300 MG PO CAPS: 600.0000 mg | ORAL_CAPSULE | Freq: Every day | ORAL | Status: AC

## 2011-11-26 MED ORDER — HYDROCOD POLST-CHLORPHEN POLST 10-8 MG/5ML PO LQCR: 5.0000 mL | Freq: Two times a day (BID) | ORAL | Status: DC | PRN

## 2011-11-26 MED ORDER — FLUCONAZOLE 150 MG PO TABS: 150.0000 mg | ORAL_TABLET | Freq: Once | ORAL | Status: AC

## 2011-11-26 NOTE — Patient Instructions (Signed)
Get plenty of rest and drink at least 64 ounces of water daily. Continue the Mucinex Maximum Strength. Stop the pseudofed.

## 2011-11-26 NOTE — Progress Notes (Signed)
Subjective:    Patient ID: [name], female    DOB: 05-Mar-1966, 46 y.o.   MRN: [id number]  HPI This 46 y.o. Female presents for evaluation of worsening cough, congestion.  Illness began with a sore throat.  Seen at another facility, where strep test and throat culture were negative.  Cough is non-productive, though she can feel a burning sensation in the chest and feels like the phlegm is moving when she coughs.  Subjective fever/chills. Reports bad taste, HA (worse with coughing, and can cause gagging and vomiting), myalgias.  Review of Systems As above.  History reviewed. No pertinent past medical history.  Prior to Admission medications   Medication Sig Start Date End Date Taking? Authorizing Provider  acetaminophen (TYLENOL) 500 MG tablet Take 500 mg by mouth every 6 (six) hours as needed.   Yes Historical Provider, MD  dextromethorphan-guaiFENesin (MUCINEX DM) 30-600 MG per 12 hr tablet Take 1 tablet by mouth every 12 (twelve) hours.   Yes Historical Provider, MD  pseudoephedrine (SUDAFED) 30 MG tablet Take 30 mg by mouth every 4 (four) hours as needed.   Yes Historical Provider, MD    Allergies  Allergen Reactions  . Aspirin Nausea Only   History   Social History  . Marital Status: Single-engaged    Number of Children: 2 adult daughters   Social History Main Topics  . Smoking status: Current Everyday Smoker -- 0.2 packs/day    Types: Cigarettes  . Smokeless tobacco: Never Used  . Alcohol Use: 0.0 oz/week    0-1 drink(s) per week  . Drug Use: No  . Sexually Active: Yes    Birth Control/ Protection: Surgical, None   Family History  Problem Relation Age of Onset  . Hypertension Mother   . Hepatitis Mother     Hepatitis C  . Asthma Sister       Objective:   Physical Exam  Vitals reviewed. Constitutional: She is oriented to person, place, and time. Vital signs are normal. She appears well-developed and well-nourished. She is active.  Non-toxic  appearance. She does not have a sickly appearance. She appears ill. No distress.  HENT:  Head: Normocephalic and atraumatic.  Right Ear: Hearing, tympanic membrane, external ear and ear canal normal.  Left Ear: Hearing, tympanic membrane, external ear and ear canal normal.  Nose: Mucosal edema and rhinorrhea present.  No foreign bodies. Right sinus exhibits no maxillary sinus tenderness and no frontal sinus tenderness. Left sinus exhibits no maxillary sinus tenderness and no frontal sinus tenderness.  Mouth/Throat: Uvula is midline, oropharynx is clear and moist and mucous membranes are normal. No uvula swelling. No oropharyngeal exudate.       Frontal and maxillary sinus tenderness  Eyes: Conjunctivae and EOM are normal. Pupils are equal, round, and reactive to light. Right eye exhibits no discharge. Left eye exhibits no discharge. No scleral icterus.  Neck: Trachea normal, normal range of motion and full passive range of motion without pain. Neck supple. No mass and no thyromegaly present.  Cardiovascular: Normal rate, regular rhythm and normal heart sounds.   Pulmonary/Chest: Effort normal and breath sounds normal.  Lymphadenopathy:       Head (right side): No submandibular, no tonsillar, no preauricular, no posterior auricular and no occipital adenopathy present.       Head (left side): No submandibular, no tonsillar, no preauricular and no occipital adenopathy present.    She has no cervical adenopathy.       Right: No supraclavicular adenopathy present.  Left: No supraclavicular adenopathy present.  Neurological: She is alert and oriented to person, place, and time. She has normal strength. No cranial nerve deficit or sensory deficit.  Skin: Skin is warm, dry and intact. No rash noted.  Psychiatric: She has a normal mood and affect. Her speech is normal and behavior is normal.      Assessment & Plan:   1. Acute sinusitis, unspecified  cefdinir (OMNICEF) 300 MG capsule, ipratropium  (ATROVENT) 0.03 % nasal spray, fluconazole (DIFLUCAN) 150 MG tablet  2. Cough  chlorpheniramine-HYDROcodone (TUSSIONEX PENNKINETIC ER) 10-8 MG/5ML LQCR  3. Acute pharyngitis     Patient Instructions  Get plenty of rest and drink at least 64 ounces of water daily. Continue the Mucinex Maximum Strength. Stop the pseudofed.

## 2012-03-01 ENCOUNTER — Encounter (HOSPITAL_COMMUNITY): Payer: Self-pay | Admitting: *Deleted

## 2012-03-01 ENCOUNTER — Emergency Department (HOSPITAL_COMMUNITY): Payer: No Typology Code available for payment source

## 2012-03-01 ENCOUNTER — Emergency Department (HOSPITAL_COMMUNITY)
Admission: EM | Admit: 2012-03-01 | Discharge: 2012-03-02 | Disposition: A | Discharge status: Home / Self Care | Payer: No Typology Code available for payment source | Attending: Emergency Medicine | Admitting: Emergency Medicine

## 2012-03-01 DIAGNOSIS — S161XXA Strain of muscle, fascia and tendon at neck level, initial encounter: Secondary | ICD-10-CM

## 2012-03-01 DIAGNOSIS — F17200 Nicotine dependence, unspecified, uncomplicated: Secondary | ICD-10-CM | POA: Insufficient documentation

## 2012-03-01 DIAGNOSIS — S139XXA Sprain of joints and ligaments of unspecified parts of neck, initial encounter: Secondary | ICD-10-CM | POA: Insufficient documentation

## 2012-03-01 DIAGNOSIS — Y939 Activity, unspecified: Secondary | ICD-10-CM | POA: Insufficient documentation

## 2012-03-01 DIAGNOSIS — S20219A Contusion of unspecified front wall of thorax, initial encounter: Secondary | ICD-10-CM | POA: Insufficient documentation

## 2012-03-01 DIAGNOSIS — S8000XA Contusion of unspecified knee, initial encounter: Secondary | ICD-10-CM | POA: Insufficient documentation

## 2012-03-01 HISTORY — DX: Anemia, unspecified: D64.9

## 2012-03-01 LAB — CBC WITH DIFFERENTIAL/PLATELET
Basophils Absolute: 0 10*3/uL (ref 0.0–0.1)
HCT: 39.2 % (ref 36.0–46.0)
Lymphocytes Relative: 40 % (ref 12–46)
Lymphs Abs: 3.5 10*3/uL (ref 0.7–4.0)
Monocytes Absolute: 0.5 10*3/uL (ref 0.1–1.0)
Neutro Abs: 4.6 10*3/uL (ref 1.7–7.7)
RBC: 4.67 MIL/uL (ref 3.87–5.11)
RDW: 13 % (ref 11.5–15.5)
WBC: 8.8 10*3/uL (ref 4.0–10.5)

## 2012-03-01 LAB — BASIC METABOLIC PANEL
CO2: 23 mEq/L (ref 19–32)
Chloride: 99 mEq/L (ref 96–112)
Creatinine, Ser: 0.63 mg/dL (ref 0.50–1.10)
Glucose, Bld: 149 mg/dL — ABNORMAL HIGH (ref 70–99)
Sodium: 135 mEq/L (ref 135–145)

## 2012-03-01 MED ORDER — POTASSIUM CHLORIDE CRYS ER 20 MEQ PO TBCR
20.0000 meq | EXTENDED_RELEASE_TABLET | Freq: Once | ORAL | Status: AC
  Administered 2012-03-02: 20 meq via ORAL
  Filled 2012-03-01: qty 1

## 2012-03-01 MED ORDER — MORPHINE SULFATE 4 MG/ML IJ SOLN
4.0000 mg | Freq: Once | INTRAMUSCULAR | Status: AC
  Administered 2012-03-01: 4 mg via INTRAVENOUS
  Filled 2012-03-01: qty 1

## 2012-03-01 MED ORDER — ONDANSETRON HCL 4 MG/2ML IJ SOLN
4.0000 mg | Freq: Once | INTRAMUSCULAR | Status: AC
  Administered 2012-03-01: 4 mg via INTRAVENOUS
  Filled 2012-03-01: qty 2

## 2012-03-01 MED ORDER — CYCLOBENZAPRINE HCL 10 MG PO TABS: 10.0000 mg | ORAL_TABLET | Freq: Two times a day (BID) | ORAL | Status: DC | PRN

## 2012-03-01 MED ORDER — OXYCODONE-ACETAMINOPHEN 5-325 MG PO TABS: 1.0000 | ORAL_TABLET | Freq: Four times a day (QID) | ORAL | Status: DC | PRN

## 2012-03-01 MED ORDER — IBUPROFEN 600 MG PO TABS: 600.0000 mg | ORAL_TABLET | Freq: Four times a day (QID) | ORAL | Status: DC | PRN
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
## 2012-03-02 NOTE — Progress Notes (Signed)
Orthopedic Tech Progress Note Patient Details:  [name] 08-23-1965 [id number]  Ortho Devices Type of Ortho Device: Knee Sleeve   Haskell Flirt 03/02/2012, 12:30 AM

## 2012-03-08 NOTE — ED Provider Notes (Signed)
Medical screening examination/treatment/procedure(s) were performed by non-physician practitioner and as supervising physician I was immediately available for consultation/collaboration.   Suzi Roots, MD 03/08/12 [phone]

## 2012-04-05 ENCOUNTER — Encounter (HOSPITAL_COMMUNITY): Payer: Self-pay

## 2012-04-05 ENCOUNTER — Emergency Department (HOSPITAL_COMMUNITY)
Admission: EM | Admit: 2012-04-05 | Discharge: 2012-04-05 | Disposition: A | Discharge status: Home / Self Care | Payer: No Typology Code available for payment source | Attending: Emergency Medicine | Admitting: Emergency Medicine

## 2012-04-05 DIAGNOSIS — M546 Pain in thoracic spine: Secondary | ICD-10-CM | POA: Insufficient documentation

## 2012-04-05 DIAGNOSIS — T148XXA Other injury of unspecified body region, initial encounter: Secondary | ICD-10-CM

## 2012-04-05 DIAGNOSIS — Z862 Personal history of diseases of the blood and blood-forming organs and certain disorders involving the immune mechanism: Secondary | ICD-10-CM | POA: Insufficient documentation

## 2012-04-05 DIAGNOSIS — S239XXA Sprain of unspecified parts of thorax, initial encounter: Secondary | ICD-10-CM | POA: Insufficient documentation

## 2012-04-05 DIAGNOSIS — Y9389 Activity, other specified: Secondary | ICD-10-CM | POA: Insufficient documentation

## 2012-04-05 DIAGNOSIS — X500XXA Overexertion from strenuous movement or load, initial encounter: Secondary | ICD-10-CM | POA: Insufficient documentation

## 2012-04-05 DIAGNOSIS — Y9210 Unspecified residential institution as the place of occurrence of the external cause: Secondary | ICD-10-CM | POA: Insufficient documentation

## 2012-04-05 DIAGNOSIS — M549 Dorsalgia, unspecified: Secondary | ICD-10-CM

## 2012-04-05 DIAGNOSIS — F17200 Nicotine dependence, unspecified, uncomplicated: Secondary | ICD-10-CM | POA: Insufficient documentation

## 2012-04-05 MED ORDER — OXYCODONE-ACETAMINOPHEN 5-325 MG PO TABS
2.0000 | ORAL_TABLET | Freq: Once | ORAL | Status: AC
  Administered 2012-04-05: 2 via ORAL
  Filled 2012-04-05: qty 2

## 2012-04-05 MED ORDER — CYCLOBENZAPRINE HCL 10 MG PO TABS: 10.0000 mg | ORAL_TABLET | Freq: Two times a day (BID) | ORAL | Status: DC | PRN

## 2012-04-05 MED ORDER — HYDROCODONE-ACETAMINOPHEN 5-500 MG PO TABS: 1.0000 | ORAL_TABLET | Freq: Four times a day (QID) | ORAL | Status: DC | PRN

## 2012-04-05 MED ORDER — KETOROLAC TROMETHAMINE 60 MG/2ML IM SOLN
60.0000 mg | Freq: Once | INTRAMUSCULAR | Status: AC
  Administered 2012-04-05: 60 mg via INTRAMUSCULAR
  Filled 2012-04-05: qty 2

## 2012-04-05 MED ORDER — CYCLOBENZAPRINE HCL 10 MG PO TABS
10.0000 mg | ORAL_TABLET | Freq: Once | ORAL | Status: AC
  Administered 2012-04-05: 10 mg via ORAL
  Filled 2012-04-05: qty 1
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
## 2012-04-05 NOTE — ED Provider Notes (Signed)
Medical screening examination/treatment/procedure(s) were performed by non-physician practitioner and as supervising physician I was immediately available for consultation/collaboration.  Deerica Waszak, MD 04/05/12 1030 
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
## 2012-05-07 ENCOUNTER — Ambulatory Visit: Payer: Self-pay | Attending: Orthopaedic Surgery

## 2012-05-07 DIAGNOSIS — M25569 Pain in unspecified knee: Secondary | ICD-10-CM | POA: Insufficient documentation

## 2012-05-07 DIAGNOSIS — R262 Difficulty in walking, not elsewhere classified: Secondary | ICD-10-CM | POA: Insufficient documentation

## 2012-05-07 DIAGNOSIS — IMO0001 Reserved for inherently not codable concepts without codable children: Secondary | ICD-10-CM | POA: Insufficient documentation

## 2012-05-07 DIAGNOSIS — M25669 Stiffness of unspecified knee, not elsewhere classified: Secondary | ICD-10-CM | POA: Insufficient documentation

## 2012-05-11 ENCOUNTER — Ambulatory Visit: Payer: Self-pay

## 2012-05-12 DIAGNOSIS — E785 Hyperlipidemia, unspecified: Secondary | ICD-10-CM

## 2012-05-12 HISTORY — DX: Hyperlipidemia, unspecified: E78.5

## 2012-05-18 ENCOUNTER — Ambulatory Visit: Payer: Self-pay | Attending: Orthopaedic Surgery

## 2012-05-18 DIAGNOSIS — IMO0001 Reserved for inherently not codable concepts without codable children: Secondary | ICD-10-CM | POA: Insufficient documentation

## 2012-05-18 DIAGNOSIS — M25669 Stiffness of unspecified knee, not elsewhere classified: Secondary | ICD-10-CM | POA: Insufficient documentation

## 2012-05-18 DIAGNOSIS — R262 Difficulty in walking, not elsewhere classified: Secondary | ICD-10-CM | POA: Insufficient documentation

## 2012-05-18 DIAGNOSIS — M25569 Pain in unspecified knee: Secondary | ICD-10-CM | POA: Insufficient documentation

## 2012-05-20 ENCOUNTER — Ambulatory Visit: Payer: Self-pay | Admitting: Rehabilitation

## 2012-05-25 ENCOUNTER — Ambulatory Visit: Payer: Self-pay

## 2012-05-27 ENCOUNTER — Ambulatory Visit: Payer: Self-pay

## 2012-06-01 ENCOUNTER — Ambulatory Visit: Payer: Self-pay

## 2012-06-04 ENCOUNTER — Ambulatory Visit: Payer: Self-pay

## 2012-06-08 ENCOUNTER — Ambulatory Visit: Payer: Self-pay

## 2012-06-10 ENCOUNTER — Ambulatory Visit: Payer: Self-pay

## 2012-06-15 ENCOUNTER — Ambulatory Visit: Payer: Self-pay | Attending: Family Medicine

## 2012-06-15 DIAGNOSIS — R262 Difficulty in walking, not elsewhere classified: Secondary | ICD-10-CM | POA: Insufficient documentation

## 2012-06-15 DIAGNOSIS — IMO0001 Reserved for inherently not codable concepts without codable children: Secondary | ICD-10-CM | POA: Insufficient documentation

## 2012-06-15 DIAGNOSIS — M25569 Pain in unspecified knee: Secondary | ICD-10-CM | POA: Insufficient documentation

## 2012-06-15 DIAGNOSIS — M25669 Stiffness of unspecified knee, not elsewhere classified: Secondary | ICD-10-CM | POA: Insufficient documentation

## 2012-06-17 ENCOUNTER — Ambulatory Visit: Payer: Self-pay

## 2012-06-22 ENCOUNTER — Ambulatory Visit: Payer: Self-pay

## 2012-06-24 ENCOUNTER — Ambulatory Visit: Payer: No Typology Code available for payment source

## 2012-07-01 ENCOUNTER — Other Ambulatory Visit (HOSPITAL_COMMUNITY): Payer: Self-pay | Admitting: Family Medicine

## 2012-07-01 DIAGNOSIS — Z1231 Encounter for screening mammogram for malignant neoplasm of breast: Secondary | ICD-10-CM

## 2012-07-08 ENCOUNTER — Ambulatory Visit (HOSPITAL_COMMUNITY): Payer: Self-pay | Attending: Family Medicine

## 2012-07-09 ENCOUNTER — Other Ambulatory Visit (HOSPITAL_COMMUNITY): Payer: Self-pay | Admitting: Family Medicine

## 2012-07-09 DIAGNOSIS — Z1231 Encounter for screening mammogram for malignant neoplasm of breast: Secondary | ICD-10-CM

## 2012-07-14 ENCOUNTER — Ambulatory Visit (HOSPITAL_COMMUNITY)
Admission: RE | Admit: 2012-07-14 | Discharge: 2012-07-14 | Disposition: A | Discharge status: Home / Self Care | Payer: No Typology Code available for payment source | Source: Ambulatory Visit | Attending: Family Medicine | Admitting: Family Medicine

## 2012-07-14 DIAGNOSIS — Z1231 Encounter for screening mammogram for malignant neoplasm of breast: Secondary | ICD-10-CM

## 2012-11-14 ENCOUNTER — Emergency Department (HOSPITAL_COMMUNITY)
Admission: EM | Admit: 2012-11-14 | Discharge: 2012-11-14 | Disposition: A | Discharge status: Home / Self Care | Payer: BC Managed Care – PPO | Attending: Emergency Medicine | Admitting: Emergency Medicine

## 2012-11-14 ENCOUNTER — Emergency Department (HOSPITAL_COMMUNITY): Payer: BC Managed Care – PPO

## 2012-11-14 ENCOUNTER — Encounter (HOSPITAL_COMMUNITY): Payer: Self-pay | Admitting: Emergency Medicine

## 2012-11-14 DIAGNOSIS — R079 Chest pain, unspecified: Secondary | ICD-10-CM

## 2012-11-14 DIAGNOSIS — Z862 Personal history of diseases of the blood and blood-forming organs and certain disorders involving the immune mechanism: Secondary | ICD-10-CM | POA: Insufficient documentation

## 2012-11-14 DIAGNOSIS — R0602 Shortness of breath: Secondary | ICD-10-CM | POA: Insufficient documentation

## 2012-11-14 DIAGNOSIS — F17200 Nicotine dependence, unspecified, uncomplicated: Secondary | ICD-10-CM | POA: Insufficient documentation

## 2012-11-14 DIAGNOSIS — R0789 Other chest pain: Secondary | ICD-10-CM | POA: Insufficient documentation

## 2012-11-14 LAB — POCT I-STAT TROPONIN I: Troponin i, poc: 0 ng/mL (ref 0.00–0.08)

## 2012-11-14 LAB — CBC
MCH: 28.5 pg (ref 26.0–34.0)
MCHC: 33.5 g/dL (ref 30.0–36.0)
MCV: 85 fL (ref 78.0–100.0)
Platelets: 237 10*3/uL (ref 150–400)
RDW: 12.5 % (ref 11.5–15.5)
WBC: 5.5 10*3/uL (ref 4.0–10.5)

## 2012-11-14 LAB — BASIC METABOLIC PANEL
Calcium: 9.3 mg/dL (ref 8.4–10.5)
Chloride: 102 mEq/L (ref 96–112)
Creatinine, Ser: 0.64 mg/dL (ref 0.50–1.10)
GFR calc Af Amer: >90 mL/min (ref >90)
GFR calc non Af Amer: >90 mL/min (ref >90)

## 2012-11-14 MED ORDER — TRAMADOL HCL 50 MG PO TABS: 50.0000 mg | ORAL_TABLET | Freq: Four times a day (QID) | ORAL | Status: DC | PRN

## 2012-11-14 MED ORDER — NAPROXEN 500 MG PO TABS: 500.0000 mg | ORAL_TABLET | Freq: Two times a day (BID) | ORAL | Status: DC

## 2012-11-14 NOTE — ED Provider Notes (Addendum)
Suspect diuresis has led to renal insufficiency and severe dehydration with hyperkalemia and hypotension.  Hurman Horn, MD 11/14/12 2210  Above note entered on wrong chart inadvertantly.  Medical screening examination/treatment/procedure(s) were performed by non-physician practitioner and as supervising physician I was immediately available for consultation/collaboration.[id number]   Hurman Horn, MD 01/04/13 2124

## 2012-11-14 NOTE — ED Provider Notes (Signed)
History    CSN: [id number] Arrival date & time 11/14/12  1141  First MD Initiated Contact with Patient 11/14/12 1150     Chief Complaint  Patient presents with  . Chest Pain   (Consider location/radiation/quality/duration/timing/severity/associated sxs/prior Treatment) HPI [name] is a 47 y.o. female who presents to ED with complaint of a chest pain. States pain comes and goes for several weeks now. Nothing makes it come on or resolve. States at times feels shortness of breath. No nausea, no dizziness, no diarrhea. States took tylenol for pain with no improvement. No hx of cp or cardiac or lung problems. No fever. No cough. No uri symptoms. States chest is painful when pressing on it. No pleurisy. No recent travel or surgeries.    Past Medical History  Diagnosis Date  . Anemia    Past Surgical History  Procedure Laterality Date  . Myomectomy    . Ectopic pregnancy surgery    . Tubal ligation     Family History  Problem Relation Age of Onset  . Hypertension Mother   . Hepatitis Mother     Hepatitis C  . Asthma Sister    History  Substance Use Topics  . Smoking status: Current Every Day Smoker -- 0.25 packs/day    Types: Cigarettes  . Smokeless tobacco: Never Used  . Alcohol Use: 0.0 oz/week    0-1 drink(s) per week   OB History   Grav Para Term Preterm Abortions TAB SAB Ect Mult Living                 Review of Systems  HENT: Negative for neck pain and neck stiffness.   Respiratory: Positive for chest tightness and shortness of breath.   Cardiovascular: Positive for chest pain. Negative for palpitations and leg swelling.  Gastrointestinal: Negative.   Genitourinary: Negative for dysuria.  Musculoskeletal: Negative.   Skin: Negative.   Neurological: Negative for dizziness, weakness, numbness and headaches.  All other systems reviewed and are negative.    Allergies  Aspirin  Home Medications   Current Outpatient Rx  Name  Route  Sig   Dispense  Refill  . cyclobenzaprine (FLEXERIL) 10 MG tablet   Oral   Take 1 tablet (10 mg total) by mouth 2 (two) times daily as needed for muscle spasms.   20 tablet   0   . cyclobenzaprine (FLEXERIL) 10 MG tablet   Oral   Take 1 tablet (10 mg total) by mouth 2 (two) times daily as needed for muscle spasms.   20 tablet   0   . HYDROcodone-acetaminophen (VICODIN) 5-500 MG per tablet   Oral   Take 1-2 tablets by mouth every 6 (six) hours as needed for pain.   6 tablet   0   . ibuprofen (ADVIL,MOTRIN) 600 MG tablet   Oral   Take 1 tablet (600 mg total) by mouth every 6 (six) hours as needed for pain.   30 tablet   0   . oxyCODONE-acetaminophen (PERCOCET/ROXICET) 5-325 MG per tablet   Oral   Take 1 tablet by mouth every 6 (six) hours as needed for pain.   15 tablet   0    BP 119/79  Pulse 72  Temp(Src) 98.2 F (36.8 C) (Oral)  Resp 18  SpO2 95% Physical Exam  Nursing note and vitals reviewed. Constitutional: She is oriented to person, place, and time. She appears well-developed and well-nourished. No distress.  Eyes: Conjunctivae are normal.  Neck: Neck supple.  Cardiovascular: Normal rate, regular rhythm, normal heart sounds and intact distal pulses.   Pulmonary/Chest: Effort normal and breath sounds normal. No respiratory distress. She has no wheezes. She has no rales. She exhibits tenderness.  Tender with palpation over sternum  Abdominal: Soft. Bowel sounds are normal. She exhibits no distension. There is no tenderness. There is no rebound.  Musculoskeletal: She exhibits no edema.  Neurological: She is alert and oriented to person, place, and time.  Skin: Skin is warm and dry.    ED Course  Procedures (including critical care time)  . Date: 11/14/2012  Rate: 71  Rhythm: normal sinus rhythm  QRS Axis: normal  Intervals: normal  ST/T Wave abnormalities: normal  Conduction Disutrbances: none  Narrative Interpretation:   Old EKG Reviewed: No old  Results  for orders placed during the hospital encounter of 11/14/12  CBC      Result Value Range   WBC 5.5  4.0 - 10.5 K/uL   RBC 4.74  3.87 - 5.11 MIL/uL   Hemoglobin 13.5  12.0 - 15.0 g/dL   HCT 08.6  57.8 - 46.9 %   MCV 85.0  78.0 - 100.0 fL   MCH 28.5  26.0 - 34.0 pg   MCHC 33.5  30.0 - 36.0 g/dL   RDW 62.9  52.8 - 41.3 %   Platelets 237  150 - 400 K/uL  BASIC METABOLIC PANEL      Result Value Range   Sodium 138  135 - 145 mEq/L   Potassium 3.9  3.5 - 5.1 mEq/L   Chloride 102  96 - 112 mEq/L   CO2 26  19 - 32 mEq/L   Glucose, Bld 95  70 - 99 mg/dL   BUN 13  6 - 23 mg/dL   Creatinine, Ser 2.44  0.50 - 1.10 mg/dL   Calcium 9.3  8.4 - 01.0 mg/dL   GFR calc non Af Amer >90  >90 mL/min   GFR calc Af Amer >90  >90 mL/min  POCT I-STAT TROPONIN I      Result Value Range   Troponin i, poc 0.00  0.00 - 0.08 ng/mL   Comment 3            Dg Chest 2 View  11/14/2012   *RADIOLOGY REPORT*  Clinical Data: Chest pain  CHEST - 2 VIEW  Comparison: Prior chest x-ray 05/12/2011  Findings: The lungs are well-aerated and free from pulmonary edema, focal airspace consolidation or pulmonary nodule.  Cardiac and mediastinal contours are within normal limits.  No pneumothorax, or pleural effusion. No acute osseous findings.  IMPRESSION:  No acute cardiopulmonary disease.   Original Report Authenticated By: Malachy Moan, M.D.    2:36 PM Pt low risk chest pain, reproducible with palpation. Suspect chest wall pain. VS normal. ECG normal. Will repeat trop at 6 hrs post pain onset. Pt is TIMI 0. PERC negative.      1. Chest pain     MDM  Pt with atypical chest pain. Trop at 6 hrs negative. ECG normal. Labs and CXR normal otherwise. VS normal. Pt's pain reproducible with palpation. Will start on anti inflammatories, pain medication. Suspect costochondritis vs other chest wall pain. Did instruct to follow up with pcp as soon as able, call him tomorrow   Filed Vitals:   11/14/12 1215 11/14/12 1415 11/14/12  1430 11/14/12 1445  BP: 95/69 109/77 107/73 111/70  Pulse:  65 59 60  Temp:      TempSrc:  Resp: 17 24 23 17   SpO2:  100% 99% 100%     Lottie Mussel, PA-C 11/14/12 1548

## 2012-11-14 NOTE — ED Notes (Signed)
Pt c/o mid sternal CP x 3 weeks with some intermittent foot and hand swelling; pt sts worse with palpation and movement

## 2012-12-24 ENCOUNTER — Ambulatory Visit: Payer: BC Managed Care – PPO | Admitting: Physician Assistant

## 2012-12-24 ENCOUNTER — Ambulatory Visit: Payer: BC Managed Care – PPO

## 2012-12-24 VITALS — BP 106/68 | HR 64 | Temp 87.7°F | Resp 16 | Ht 67.0 in | Wt 204.0 lb

## 2012-12-24 DIAGNOSIS — S9032XA Contusion of left foot, initial encounter: Secondary | ICD-10-CM

## 2012-12-24 DIAGNOSIS — M25562 Pain in left knee: Secondary | ICD-10-CM

## 2012-12-24 DIAGNOSIS — M25579 Pain in unspecified ankle and joints of unspecified foot: Secondary | ICD-10-CM

## 2012-12-24 DIAGNOSIS — M25572 Pain in left ankle and joints of left foot: Secondary | ICD-10-CM

## 2012-12-24 DIAGNOSIS — M25569 Pain in unspecified knee: Secondary | ICD-10-CM

## 2012-12-24 DIAGNOSIS — S9030XA Contusion of unspecified foot, initial encounter: Secondary | ICD-10-CM

## 2012-12-24 MED ORDER — HYDROCODONE-ACETAMINOPHEN 5-325 MG PO TABS: 1.0000 | ORAL_TABLET | Freq: Four times a day (QID) | ORAL | Status: DC | PRN

## 2012-12-24 NOTE — Progress Notes (Signed)
Patient ID: [name] MRN: [id number], DOB: 08-Aug-1965, 47 y.o. Date of Encounter: 12/24/2012, 10:37 AM  Primary Physician: Altamese [place], MD  Chief Complaint: Left foot pain x 3 days  HPI: 47 y.o. female with history below presents with left foot pain for 3 days after falling through a tall kitchen chair while truing to hang curtains for her daughter. The pain radiates up the back of her leg towards her hip with ambulation. There is no radiation when she is seated. When seated pain is localized to the medial ankle and back of the foot. Some pain along the left knee. She thinks she hit her knee during the fall, but is not certain. She denies any pain into her hip. She has been walking with a limp, not placing her heel on the ground. This is still causing considerable discomfort for her.     Past Medical History  Diagnosis Date  . Anemia      Home Meds: Prior to Admission medications   Medication Sig Start Date End Date Taking? Authorizing Provider  naproxen (NAPROSYN) 500 MG tablet Take 1 tablet (500 mg total) by mouth 2 (two) times daily. 11/14/12  No Tatyana A Kirichenko, PA-C  traMADol (ULTRAM) 50 MG tablet Take 1 tablet (50 mg total) by mouth every 6 (six) hours as needed for pain. 11/14/12  No Tatyana A Kirichenko, PA-C    Allergies:  Allergies  Allergen Reactions  . Aspirin Nausea Only and Other (See Comments)    Stomach cramping    History   Social History  . Marital Status: Single    Spouse Name: N/A    Number of Children: N/A  . Years of Education: N/A   Occupational History  . Not on file.   Social History Main Topics  . Smoking status: Current Every Day Smoker -- 0.25 packs/day    Types: Cigarettes  . Smokeless tobacco: Never Used  . Alcohol Use: 0.0 oz/week    0-1 drink(s) per week  . Drug Use: No  . Sexual Activity: Yes    Birth Control/ Protection: Surgical, None   Other Topics Concern  . Not on file   Social History Narrative  . No  narrative on file     Review of Systems: Constitutional: negative for chills, fever, or fatigue  Dermatological: negative for rash, wound, erythema, swelling, or ecchymosis   Physical Exam: Blood pressure 106/68, pulse 64, temperature 87.7 F (30.9 C), resp. rate 16, height 5\' 7"  (1.702 m), weight 204 lb (92.534 kg)., Body mass index is 31.94 kg/(m^2). General: Well developed, well nourished, in no acute distress. Head: Normocephalic, atraumatic, eyes without discharge, sclera non-icteric, nares are without discharge.  Neck: Supple. Full ROM.  Lungs: Breathing is unlabored. Heart: Regular rate. Msk:  Strength and tone normal for age. Extremities/Skin: Warm and dry. No clubbing or cyanosis. No edema. No rashes or suspicious lesions. Left hip: FROM. No crepitus. No tenderness. 5/5 strength. Left knee: FROM. No crepitus. Mild TTP along medial and lateral joint lines. No effusion palpated. 5/5 strength. No ecchymosis. Left ankle: TTP along the medial malleoli and talus. No STS, ecchymosis, or erythema. Flexion and extension intact. No TTP of the mid/lateral foot. No TTP of the lateral malleoli. Distal pulses 2+. Cap refill less than 2 seconds.  Neuro: Alert and oriented X 3. Moves all extremities spontaneously. Gait is normal. CNII-XII grossly in tact. Psych:  Responds to questions appropriately with a normal affect.   [facility] reading (PRIMARY) by  Dr. Perrin Maltese.  Left knee: Negative  Left ankle: Possible fracture of the talus. Otherwise negative.   ASSESSMENT AND PLAN:  47 y.o. female with possible fracture of the talus.  -CAM walker -Crutches -Orthopedic referral -Norco 5/325 mg 1 po q 4-6 hours prn pain #30 no RF, SED -Non-weight weightbearing -Await overread   Signed, Eula Listen, PA-C 12/24/2012 10:37 AM

## 2012-12-24 NOTE — Addendum Note (Signed)
Addended by: Sondra Barges on: 12/24/2012 01:11 PM   Modules accepted: Orders

## 2013-04-25 ENCOUNTER — Other Ambulatory Visit: Payer: Self-pay | Admitting: Family Medicine

## 2013-04-25 DIAGNOSIS — R102 Pelvic and perineal pain: Secondary | ICD-10-CM

## 2013-04-27 ENCOUNTER — Ambulatory Visit
Admission: RE | Admit: 2013-04-27 | Discharge: 2013-04-27 | Disposition: A | Discharge status: Home / Self Care | Payer: BC Managed Care – PPO | Source: Ambulatory Visit | Attending: Family Medicine | Admitting: Family Medicine

## 2013-04-27 DIAGNOSIS — R102 Pelvic and perineal pain: Secondary | ICD-10-CM

## 2013-10-19 ENCOUNTER — Ambulatory Visit (INDEPENDENT_AMBULATORY_CARE_PROVIDER_SITE_OTHER): Payer: BC Managed Care – PPO | Admitting: Medical

## 2013-10-19 ENCOUNTER — Telehealth: Payer: Self-pay | Admitting: Internal Medicine

## 2013-10-19 ENCOUNTER — Encounter: Payer: Self-pay | Admitting: Medical

## 2013-10-19 VITALS — BP 112/80 | HR 60 | Temp 98.0°F | Resp 16 | Ht 66.2 in | Wt 208.0 lb

## 2013-10-19 DIAGNOSIS — E785 Hyperlipidemia, unspecified: Secondary | ICD-10-CM

## 2013-10-19 DIAGNOSIS — H938X9 Other specified disorders of ear, unspecified ear: Secondary | ICD-10-CM

## 2013-10-19 DIAGNOSIS — H9203 Otalgia, bilateral: Secondary | ICD-10-CM

## 2013-10-19 DIAGNOSIS — H9209 Otalgia, unspecified ear: Secondary | ICD-10-CM

## 2013-10-19 DIAGNOSIS — M7700 Medial epicondylitis, unspecified elbow: Secondary | ICD-10-CM

## 2013-10-19 DIAGNOSIS — M7710 Lateral epicondylitis, unspecified elbow: Secondary | ICD-10-CM

## 2013-10-19 NOTE — Patient Instructions (Signed)
Thank you for giving me the opportunity to serve you today.    Your diagnosis today includes: Encounter Diagnoses  Name Primary?  . Otalgia of both ears Yes  . Ear pressure   . Lateral epicondylitis   . Medial epicondylitis   . Hyperlipidemia      Specific recommendations today include:  Ear pain, ear pressure- your exam is normal.  Begin Nasonex nasal spray, 1 spray per nostril twice daily to see if this helps.  Make sure you increase your water intake.  Consider taking Zyrtec at bedtime for one to 2 weeks as another measure.   If no improvement after 2 weeks, see a dentist for routine followup.  If after that you're still having problems, we can refer to ear nose and throat specialist   Lateral and medial epicondylitis -  Use ice to the elbow region once daily for 20 minutes  Begin Aleve over-the-counter one tablet twice daily for one to 2 weeks  Consider getting over-the-counter tennis elbow strap for the right elbow  Consider using an arm sling a couple hours each day to help rest the arm   Hyperlipidemia-we will check prior records before making any decisions  Return pending records.    I have included other useful information below for your review.  Otalgia The most common reason for this in children is an infection of the middle ear. Pain from the middle ear is usually caused by a build-up of fluid and pressure behind the eardrum. Pain from an earache can be sharp, dull, or burning. The pain may be temporary or constant. The middle ear is connected to the nasal passages by a short narrow tube called the Eustachian tube. The Eustachian tube allows fluid to drain out of the middle ear, and helps keep the pressure in your ear equalized. CAUSES  A cold or allergy can block the Eustachian tube with inflammation and the build-up of secretions. This is especially likely in small children, because their Eustachian tube is shorter and more horizontal. When the Eustachian tube  closes, the normal flow of fluid from the middle ear is stopped. Fluid can accumulate and cause stuffiness, pain, hearing loss, and an ear infection if germs start growing in this area. SYMPTOMS  The symptoms of an ear infection may include fever, ear pain, fussiness, increased crying, and irritability. Many children will have temporary and minor hearing loss during and right after an ear infection. Permanent hearing loss is rare, but the risk increases the more infections a child has. Other causes of ear pain include retained water in the outer ear canal from swimming and bathing. Ear pain in adults is less likely to be from an ear infection. Ear pain may be referred from other locations. Referred pain may be from the joint between your jaw and the skull. It may also come from a tooth problem or problems in the neck. Other causes of ear pain include:  A foreign body in the ear.  Outer ear infection.  Sinus infections.  Impacted ear wax.  Ear injury.  Arthritis of the jaw or TMJ problems.  Middle ear infection.  Tooth infections.  Sore throat with pain to the ears. DIAGNOSIS  Your caregiver can usually make the diagnosis by examining you. Sometimes other special studies, including x-rays and lab work may be necessary. TREATMENT   If antibiotics were prescribed, use them as directed and finish them even if you or your child's symptoms seem to be improved.  Sometimes PE tubes  are needed in children. These are little plastic tubes which are put into the eardrum during a simple surgical procedure. They allow fluid to drain easier and allow the pressure in the middle ear to equalize. This helps relieve the ear pain caused by pressure changes. HOME CARE INSTRUCTIONS   Only take over-the-counter or prescription medicines for pain, discomfort, or fever as directed by your caregiver. DO NOT GIVE CHILDREN ASPIRIN because of the association of Reye's Syndrome in children taking aspirin.  Use a  cold pack applied to the outer ear for 15-20 minutes, 03-04 times per day or as needed may reduce pain. Do not apply ice directly to the skin. You may cause frost bite.  Over-the-counter ear drops used as directed may be effective. Your caregiver may sometimes prescribe ear drops.  Resting in an upright position may help reduce pressure in the middle ear and relieve pain.  Ear pain caused by rapidly descending from high altitudes can be relieved by swallowing or chewing gum. Allowing infants to suck on a bottle during airplane travel can help.  Do not smoke in the house or near children. If you are unable to quit smoking, smoke outside.  Control allergies. SEEK IMMEDIATE MEDICAL CARE IF:   You or your child are becoming sicker.  Pain or fever relief is not obtained with medicine.  You or your child's symptoms (pain, fever, or irritability) do not improve within 24 to 48 hours or as instructed.  Severe pain suddenly stops hurting. This may indicate a ruptured eardrum.  You or your children develop new problems such as severe headaches, stiff neck, difficulty swallowing, or swelling of the face or around the ear. Document Released: 12/14/2003 Document Revised: 07/21/2011 Document Reviewed: 04/19/2008 [facility] Patient Information 2014 [place].    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 Although the condition is often called tennis or golfer's elbow, it is caused by any repetitive action performed by your elbow. HOME CARE INSTRUCTIONS  If the condition has been short lived, rest may be the only treatment required. Using your opposite hand or arm to perform the task may help. Even changing your grip may help rest the extremity. These may even prevent the condition from recurring.  Longer standing  problems, however, will often be relieved faster by:  Using anti-inflammatory agents.  Applying ice packs for 30 minutes at the end of the working day, at bed time, or when activities are finished.  Your caregiver may also have you wear a splint or sling. This will allow the inflamed tendon to heal. At times, steroid injections aided with a local anesthetic will be required along with splinting for 1 to 2 weeks. Two to three steroid injections will often solve the problem. In some long standing cases, the inflamed tendon does not respond to conservative (non-surgical) therapy. Then surgery may be required to repair it. MAKE SURE YOU:   Understand these instructions.  Will watch your condition.  Will get help right away if you are not doing well or get worse. Document Released: 04/28/2005 Document Revised: 07/21/2011 Document Reviewed: 12/15/2007 [facility] Patient Information 2014 [place].

## 2013-10-19 NOTE — Progress Notes (Signed)
   Subjective:   [name] is a 48 y.o. female presenting on 10/19/2013 with PAIN IN LEFT ELBOW, CHECK BILATERL EARS, CHECK HER CHOLOSTEROL and Sore Throat  Prior PCM stop practicioing June 1, wants to change providers.  She reports issues with right arm.  Been hair stylist since the '90s.   She reports pain in right elbow for 2 months.   No sweling.  No shoudler or wrist pain.  No numbness, tingling, weakness.  Using Tylenol without relief.  No ice or heat.  No splint.  No fall, injuur, or trauma.  She rpeorts right ear pain x 64mo.  1 day hx/o sore throat.  Has some cough, worse this past weekend. Left ear was hurting yesterday.   She has had prior ipacted cerumen.  Hearing doesn't seem off, but feel loose in the ears.  No other URI syptoms, no teeth pain, last dental visit over 1 year ago.   No prior ENT eval.  No hx/o TMJ issues.  No pain chewing.  She is on cholesteorl medication.  Last labs aobut a year ago.  She wants to know if she can stop the medication.     Review of Systems ROS as in subjective      Objective:    Filed Vitals:   10/19/13 1055  BP: 112/80  Pulse: 60  Temp: 98 F (36.7 C)  Resp: 16    General appearance: alert, no distress, WD/WN, AA female HEENT: normocephalic, sclerae anicteric, TMs pearly, nares patent, no discharge or erythema, pharynx normal, TMJ nontender Oral cavity: MMM, no lesions, no obvious decay, teeth in good repair Neck: supple, no lymphadenopathy, no thyromegaly, no masses, nontender, normal ROM Heart: RRR, normal S1, S2, no murmurs Lungs: CTA bilaterally, no wheezes, rhonchi, or rales MSK: tender right medial and lateral epicondyle, othwerise nontendher arm, normal ROM , no deformity Arms neurovascularly intact      Assessment: Encounter Diagnoses  Name Primary?  . Otalgia of both ears Yes  . Ear pressure   . Lateral epicondylitis   . Medial epicondylitis   . Hyperlipidemia      Plan: Discussed her concerns,   Treatment, and f/u.  Specific recommendations today include:  Ear pain, ear pressure- your exam is normal.  Begin Nasonex nasal spray, 1 spray per nostril twice daily to see if this helps.  Make sure you increase your water intake.  Consider taking Zyrtec at bedtime for one to 2 weeks as another measure.   If no improvement after 2 weeks, see a dentist for routine followup.  If after that you're still having problems, we can refer to ear nose and throat specialist   Lateral and medial epicondylitis -  Use ice to the elbow region once daily for 20 minutes  Begin Aleve over-the-counter one tablet twice daily for one to 2 weeks  Consider getting over-the-counter tennis elbow strap for the right elbow  Consider using an arm sling a couple hours each day to help rest the arm   Hyperlipidemia-we will check prior records before making any decisions   [name] was seen today for pain in left elbow, check bilaterl ears, check her cholosterol and sore throat.  Diagnoses and associated orders for this visit:  Otalgia of both ears  Ear pressure  Lateral epicondylitis  Medial epicondylitis  Hyperlipidemia    Return pending records.

## 2013-10-19 NOTE — Telephone Encounter (Signed)
Faxed over medical record release form to [facility] @ [phone] to get records

## 2013-10-31 ENCOUNTER — Telehealth: Payer: Self-pay | Admitting: Internal Medicine

## 2013-10-31 NOTE — Telephone Encounter (Signed)
Medical records received on pt from [facility]

## 2013-11-02 ENCOUNTER — Telehealth: Payer: Self-pay | Admitting: Medical

## 2013-11-02 NOTE — Telephone Encounter (Signed)
Patient is aware and she has scheduled her follow up appointment. CLS

## 2013-11-02 NOTE — Telephone Encounter (Signed)
I received prior records.   Have her c/t her cholesterol medication, and f/u soon to repeat fasting labs, to discuss further, and to recheck on her other issues I saw her for.

## 2013-11-08 ENCOUNTER — Encounter: Payer: Self-pay | Admitting: Medical

## 2013-11-21 ENCOUNTER — Encounter: Payer: Self-pay | Admitting: Medical

## 2013-11-21 ENCOUNTER — Ambulatory Visit (INDEPENDENT_AMBULATORY_CARE_PROVIDER_SITE_OTHER): Payer: BC Managed Care – PPO | Admitting: Medical

## 2013-11-21 VITALS — BP 110/68 | HR 60 | Temp 98.0°F | Resp 14 | Ht 66.0 in | Wt 205.0 lb

## 2013-11-21 DIAGNOSIS — H9209 Otalgia, unspecified ear: Secondary | ICD-10-CM

## 2013-11-21 DIAGNOSIS — E669 Obesity, unspecified: Secondary | ICD-10-CM

## 2013-11-21 DIAGNOSIS — H9203 Otalgia, bilateral: Secondary | ICD-10-CM

## 2013-11-21 DIAGNOSIS — F17200 Nicotine dependence, unspecified, uncomplicated: Secondary | ICD-10-CM

## 2013-11-21 DIAGNOSIS — E785 Hyperlipidemia, unspecified: Secondary | ICD-10-CM

## 2013-11-21 LAB — CBC
HCT: 40.6 % (ref 36.0–46.0)
HEMOGLOBIN: 13.7 g/dL (ref 12.0–15.0)
MCH: 28.4 pg (ref 26.0–34.0)
MCHC: 33.7 g/dL (ref 30.0–36.0)
MCV: 84.1 fL (ref 78.0–100.0)
Platelets: 248 10*3/uL (ref 150–400)
RBC: 4.83 MIL/uL (ref 3.87–5.11)
RDW: 13.3 % (ref 11.5–15.5)
WBC: 8.7 10*3/uL (ref 4.0–10.5)

## 2013-11-21 LAB — HEMOGLOBIN A1C
Hgb A1c MFr Bld: 5.8 % — ABNORMAL HIGH (ref <5.7)
MEAN PLASMA GLUCOSE: 120 mg/dL — AB (ref <117)

## 2013-11-21 NOTE — Progress Notes (Signed)
   Subjective:    Patient ID: [name], female    DOB: Feb 06, 1966, 48 y.o.   MRN: [id number]  HPI  Patient is here for follow up of her HL. She reports being diagnosed in Dec 2014 following a routine lipid panel screen. She was started on an unknown statin that she did not tolerate due to n/v and stomach discomfort. She has since been taking Simvastatin 20mg  daily but has not had a repeat lipid panel. Currently, her diet consists of a lot of unhealthy food including fried, fast food, eats out a lot. The patient does eats salads as a side but does not eat fruit, does not drink as much water too much, drinks soda and juice. She also does not exercise at all as she stays active working at a daycare. The patient is a smoker (~3 cigarettes per day). She has ~1 alcoholic beverage a month. She denies a history of diabetes, hypertension, heart disease.   Patient's cardiac risk factors are: dyslipidemia, obesity (BMI >= 30 kg/m2), sedentary lifestyle and smoking/ tobacco exposure, MGM had MI but not sure of age.  Previous cardiac testing: none.  Of note, the patient did want to address her history of anemia. She has a history of fibroids, myomectomy and blood transfusion in 2009 following a hemoglobin of ~4. She recently was evaluated by her gynecologist for having to squat to urinate. She was found to have a 1.4cm fibroid on [place] thought to be related to her urinary symptom.  Annually f/u by [place] was recommended.  Review of Systems As in subjective.     Objective:   Physical Exam  BP 110/68  Pulse 60  Temp(Src) 98 F (36.7 C)  Resp 14  Ht 5\' 6"  (1.676 m)  Wt 205 lb (92.987 kg)  BMI 33.10 kg/m2  Gen: Well-nourished, well-developed, in no acute distress, obese African-American female Neck: no carotid bruits Lungs: Clear to auscultation, no adventitious sounds including wheezing, rhonchi or rales Cardiovascular: Regular rate and rhythm, no murmurs, rubs or gallops Ext: no edema Pulses  normal     Assessment & Plan:   Encounter Diagnoses  Name Primary?  . Hyperlipidemia Yes  . Tobacco use disorder   . Obesity, unspecified   . Otalgia of both ears    Hyperlipidemia-she was started on statin late last year, but currently not using any dietary or exercise changes to help. She is still smoking.  Discussed reasons to treat high cholesterol, cardiac risk factors, will repeat labs today. Discussed the need to make diet and exercise changes and quit tobacco  Tobacco use-discussed the risks, she is smoking 3 cigarettes per day, she'll work on quitting.  She declines any medication to help at this time  Obesity-discussed need to work on diet and exercise  Otalgia-no improvement with Zyrtec and nasal spray, trial of Sudafed over-the-counter if not improving within 2 weeks, referral to ENT

## 2013-11-22 ENCOUNTER — Other Ambulatory Visit: Payer: Self-pay | Admitting: Medical

## 2013-11-22 DIAGNOSIS — Z1231 Encounter for screening mammogram for malignant neoplasm of breast: Secondary | ICD-10-CM

## 2013-11-22 LAB — COMPREHENSIVE METABOLIC PANEL
ALBUMIN: 4.6 g/dL (ref 3.5–5.2)
ALK PHOS: 74 U/L (ref 39–117)
ALT: 25 U/L (ref 0–35)
AST: 21 U/L (ref 0–37)
BUN: 14 mg/dL (ref 6–23)
CO2: 28 mEq/L (ref 19–32)
Calcium: 9.1 mg/dL (ref 8.4–10.5)
Chloride: 107 mEq/L (ref 96–112)
Creat: 0.7 mg/dL (ref 0.50–1.10)
Glucose, Bld: 90 mg/dL (ref 70–99)
POTASSIUM: 4.5 meq/L (ref 3.5–5.3)
SODIUM: 145 meq/L (ref 135–145)
TOTAL PROTEIN: 6.9 g/dL (ref 6.0–8.3)
Total Bilirubin: 0.3 mg/dL (ref 0.2–1.2)

## 2013-11-22 LAB — LIPID PANEL
CHOLESTEROL: 170 mg/dL (ref 0–200)
HDL: 42 mg/dL (ref >39)
LDL Cholesterol: 112 mg/dL — ABNORMAL HIGH (ref 0–99)
Total CHOL/HDL Ratio: 4 Ratio
Triglycerides: 78 mg/dL (ref <150)
VLDL: 16 mg/dL (ref 0–40)

## 2013-11-22 MED ORDER — SIMVASTATIN 20 MG PO TABS: 20.0000 mg | ORAL_TABLET | Freq: Every day | ORAL | Status: DC

## 2013-12-05 ENCOUNTER — Ambulatory Visit (HOSPITAL_COMMUNITY)
Admission: RE | Admit: 2013-12-05 | Discharge: 2013-12-05 | Disposition: A | Discharge status: Home / Self Care | Payer: BC Managed Care – PPO | Source: Ambulatory Visit | Attending: Medical | Admitting: Medical

## 2013-12-05 DIAGNOSIS — Z1231 Encounter for screening mammogram for malignant neoplasm of breast: Secondary | ICD-10-CM

## 2013-12-13 ENCOUNTER — Other Ambulatory Visit: Payer: Self-pay | Admitting: Medical

## 2013-12-13 DIAGNOSIS — R928 Other abnormal and inconclusive findings on diagnostic imaging of breast: Secondary | ICD-10-CM

## 2013-12-19 ENCOUNTER — Other Ambulatory Visit: Payer: Self-pay | Admitting: Medical

## 2013-12-19 ENCOUNTER — Ambulatory Visit
Admission: RE | Admit: 2013-12-19 | Discharge: 2013-12-19 | Disposition: A | Discharge status: Home / Self Care | Payer: BC Managed Care – PPO | Source: Ambulatory Visit | Attending: Medical | Admitting: Medical

## 2013-12-19 DIAGNOSIS — R921 Mammographic calcification found on diagnostic imaging of breast: Secondary | ICD-10-CM

## 2013-12-19 DIAGNOSIS — R928 Other abnormal and inconclusive findings on diagnostic imaging of breast: Secondary | ICD-10-CM

## 2013-12-26 ENCOUNTER — Ambulatory Visit
Admission: RE | Admit: 2013-12-26 | Discharge: 2013-12-26 | Disposition: A | Discharge status: Home / Self Care | Payer: BC Managed Care – PPO | Source: Ambulatory Visit | Attending: Medical | Admitting: Medical

## 2013-12-26 DIAGNOSIS — R921 Mammographic calcification found on diagnostic imaging of breast: Secondary | ICD-10-CM

## 2014-01-11 ENCOUNTER — Encounter: Payer: Self-pay | Admitting: Medical

## 2014-01-11 ENCOUNTER — Ambulatory Visit (INDEPENDENT_AMBULATORY_CARE_PROVIDER_SITE_OTHER): Payer: BC Managed Care – PPO | Admitting: Medical

## 2014-01-11 VITALS — BP 130/80 | HR 66 | Temp 98.0°F | Resp 16 | Wt 205.0 lb

## 2014-01-11 DIAGNOSIS — Z9889 Other specified postprocedural states: Secondary | ICD-10-CM

## 2014-01-11 DIAGNOSIS — N644 Mastodynia: Secondary | ICD-10-CM

## 2014-01-11 MED ORDER — CEPHALEXIN 500 MG PO CAPS: 500.0000 mg | ORAL_CAPSULE | Freq: Three times a day (TID) | ORAL | Status: DC

## 2014-01-11 NOTE — Patient Instructions (Signed)
We are treated you for possible skin infection vs inflammation.  Begin Keflex antibiotic 3 times daily for 7-10 days. However, call back 1 week from today to let me know if you are much better or not.  Begin OTC Ibuprofen 1-3 tablets up to 3 times daily the next few days.  Use warm moist heat to the left breast the next several days  Use a supportive bra  Consider recheck in 7-10 days to make sure things are back to normal.

## 2014-01-11 NOTE — Progress Notes (Signed)
Subjective: She reports having a breast biopsy on August 17 at the [facility] Center.  The pathologic results were benign.  However since the biopsy she is continued to have pain in her left breast, feels a hardness in the breasts, although the pain is actually little better the last 2 days. Denies redness, fever, nausea, vomiting, breast drainage. No other aggravating or relieving factors. No other complaint.  Review of systems as in subjective  Objective General: well-developed, well nourished, no acute distress  Breast: There is a linear palpable cord that is tender of the left superior to central breast, there is an entrance marking at the superior portion of the left breast, otherwise no lump, no lymphadenopathy, no other abnormality no redness no warmth. Exam chaperoned by nurse  Assessment: Encounter Diagnoses  Name Primary?  . Breast tenderness Yes  . S/P breast biopsy, left    Plan: I reviewed the recent biopsy procedure note as well as the pathology results.  This could represent inflammation of the breast tissue or potential infection from the procedure.  Begin Keflex 3 times a day, ibuprofen over-the-counter, warm compresses.  Advise recheck in one week, call/ return sooner if not improving

## 2014-01-17 ENCOUNTER — Telehealth: Payer: Self-pay | Admitting: Medical

## 2014-01-18 ENCOUNTER — Other Ambulatory Visit: Payer: Self-pay | Admitting: Medical

## 2014-01-18 MED ORDER — FLUCONAZOLE 150 MG PO TABS: ORAL_TABLET | ORAL | Status: DC

## 2014-01-18 NOTE — Telephone Encounter (Signed)
Begin Diflucan for likely yeast infection, 1 tablet now, and 1 tablet in a week if not completely resolved . Have her hold off on taking the simvastatin the days she takes the diflucan for possible interaction.  If not improving, recheck.  Is the breast tenderness improving?

## 2014-01-18 NOTE — Telephone Encounter (Signed)
Pt informed,  States breast tenderness is better but can still feel the lump.  She will call if not better by end of antibiotics

## 2014-03-17 ENCOUNTER — Encounter: Payer: Self-pay | Admitting: Medical

## 2014-03-17 ENCOUNTER — Ambulatory Visit (INDEPENDENT_AMBULATORY_CARE_PROVIDER_SITE_OTHER): Payer: BC Managed Care – PPO | Admitting: Medical

## 2014-03-17 ENCOUNTER — Telehealth: Payer: Self-pay | Admitting: Medical

## 2014-03-17 VITALS — BP 100/70 | HR 66 | Temp 98.2°F | Resp 16 | Wt 204.0 lb

## 2014-03-17 DIAGNOSIS — M7711 Lateral epicondylitis, right elbow: Secondary | ICD-10-CM

## 2014-03-17 MED ORDER — DICLOFENAC SODIUM 75 MG PO TBEC: 75.0000 mg | DELAYED_RELEASE_TABLET | Freq: Two times a day (BID) | ORAL | Status: DC

## 2014-03-17 MED ORDER — OXYCODONE-ACETAMINOPHEN 5-325 MG PO TABS: 1.0000 | ORAL_TABLET | Freq: Three times a day (TID) | ORAL | Status: DC | PRN

## 2014-03-17 NOTE — Telephone Encounter (Signed)
Refer to physical therapy 

## 2014-03-17 NOTE — Progress Notes (Signed)
   Subjective:   [name] is a 48 y.o. 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  I saw her in June for the same.   She had gotten a little better with the conservative treatment we did at that time, but it has flared up a lot lately in the last month, can't sleep due ot the pain.    No shoulder or wrist pain.  No numbness, tingling, weakness.  Using Tylenol without relief.  Is current using ice, arm sling some, tennis elbow strap.  Denies fall, injury, or trauma.  No other aggravating or relieving factors. No other complaint.  Review of Systems ROS as in subjective      Objective:    Filed Vitals:   03/17/14 1504  BP: 100/70  Pulse: 66  Temp: 98.2 F (36.8 C)  Resp: 16    General appearance: alert, no distress, WD/WN, AA female Neck: supple, no lymphadenopathy, no thyromegaly, no masses, nontender, normal ROM MSK: tender right lateral epicondyle, pain with resisted supination, otherwise nontender arm, normal ROM , no deformity Arms neurovascularly intact      Assessment: Encounter Diagnosis  Name Primary?  . Lateral epicondylitis (tennis elbow), right Yes     Plan: Discussed her concerns,  Treatment, and f/u.  Patient Instructions  Lateral epicondylitis/tennis elbow   For the next week, use ice to the elbow region, 20 minutes at a time, preferably 2-3 times daily  Use the arm sling for several hours at a time  REST the arm  Use the tennis elbow strap you have  Begin Diclofenac anti-inflammatory twice daily - this is for pain and inflammation.  Take this with food.  You may also use Percocoet pain medications, 1 tablet up to 3 times daily .  Cautions this will make you sleepy.  The main goal here is to REST the arm for up to 2 weeks as much as possible  We will go ahead and make referral to physical therapy   [name] was seen today for right arm  pain.  Diagnoses and associated orders for this visit:  Lateral epicondylitis (tennis elbow), right  Other Orders - oxyCODONE-acetaminophen (ROXICET) 5-325 MG per tablet; Take 1 tablet by mouth every 8 (eight) hours as needed for severe pain. - diclofenac (VOLTAREN) 75 MG EC tablet; Take 1 tablet (75 mg total) by mouth 2 (two) times daily.    Return pending referral.

## 2014-03-17 NOTE — Patient Instructions (Signed)
Lateral epicondylitis/tennis elbow   For the next week, use ice to the elbow region, 20 minutes at a time, preferably 2-3 times daily  Use the arm sling for several hours at a time  REST the arm  Use the tennis elbow strap you have  Begin Diclofenac anti-inflammatory twice daily - this is for pain and inflammation.  Take this with food.  You may also use Percocoet pain medications, 1 tablet up to 3 times daily .  Cautions this will make you sleepy.  The main goal here is to REST the arm for up to 2 weeks as much as possible  We will go ahead and make referral to physical therapy

## 2014-03-20 NOTE — Telephone Encounter (Signed)
I fax over her referral to [facility] Fax # [phone] and they will contact the patient for her appointment. CLS

## 2014-04-03 ENCOUNTER — Telehealth: Payer: Self-pay | Admitting: Internal Medicine

## 2014-04-03 NOTE — Telephone Encounter (Signed)
Advised pt of Shane's instructions. Made appointment for tomorrow and also advised her that she could take Ibuprofen to help with pain in the meantime per [facility]

## 2014-04-03 NOTE — Telephone Encounter (Signed)
Really needs to re-examine.   Have her come in as this may dictate ultrasound vs mammogram vs another round of antibiotic.

## 2014-04-03 NOTE — Telephone Encounter (Signed)
Pt states that she is having breast pain again. She had a biopsy done a while back and states that she started feeling pain again. She feels like a thrombing pinching sensation and it hurts. She did call over to the breast center and tried to get an appt but they told her she needed a referral for a diagnostic mammogram. Please advise and let pt know

## 2014-04-04 ENCOUNTER — Ambulatory Visit (INDEPENDENT_AMBULATORY_CARE_PROVIDER_SITE_OTHER): Payer: BC Managed Care – PPO | Admitting: Medical

## 2014-04-04 ENCOUNTER — Encounter: Payer: Self-pay | Admitting: Medical

## 2014-04-04 VITALS — BP 100/80 | HR 80 | Temp 97.5°F | Resp 16 | Wt 201.0 lb

## 2014-04-04 DIAGNOSIS — N644 Mastodynia: Secondary | ICD-10-CM

## 2014-04-04 MED ORDER — OXYCODONE-ACETAMINOPHEN 5-325 MG PO TABS: ORAL_TABLET | ORAL | Status: DC

## 2014-04-04 MED ORDER — CEPHALEXIN 500 MG PO CAPS: 500.0000 mg | ORAL_CAPSULE | Freq: Three times a day (TID) | ORAL | Status: DC

## 2014-04-04 NOTE — Progress Notes (Signed)
Subjective: Here for a second episode of breast pain in recent months.  I saw her September 2 for left breast pain just a week or 2 after she had had breast biopsy.  This cleared up with antibiotics and heat.  Has done fine up until this past week when she started getting left breast pain, swelling, but no redness no discharge or fever no nausea or vomiting, no lymphadenopathy, no chest wall pain. She denies injury or trauma, no recent activity to aggravate her chest or breast. Using nothing for the symptoms.  She reports a lot of pain.  Review of systems as in subjective  Objective General: well-developed, well nourished, no acute distress  Breast: left lateral breast is tender in general, small area of nodularity deep within the breast at 3:00 position,Otherwise no palpable cord that was present at last visit, no other abnormality noted, Exam chaperoned by nurse  Assessment: Encounter Diagnosis  Name Primary?  . Breast pain, left Yes   Plan: I reviewed the recent biopsy procedure note as well as the pathology results.   Begin Keflex 3 times a day,prescription for Percocet for pain, referral for breast ultrasound /referral back to breast center

## 2014-04-18 ENCOUNTER — Telehealth: Payer: Self-pay | Admitting: Medical

## 2014-04-18 ENCOUNTER — Other Ambulatory Visit: Payer: Self-pay | Admitting: Medical

## 2014-04-18 NOTE — Telephone Encounter (Signed)
I sent the diclofenac medication but if she is still having a lot of pain from the tennis elbow, see if she was to come in for a shot of steroid locally

## 2014-04-18 NOTE — Telephone Encounter (Signed)
Is this okay to refill? 

## 2014-04-19 ENCOUNTER — Ambulatory Visit
Admission: RE | Admit: 2014-04-19 | Discharge: 2014-04-19 | Disposition: A | Discharge status: Home / Self Care | Payer: BC Managed Care – PPO | Source: Ambulatory Visit | Attending: Medical | Admitting: Medical

## 2014-04-19 ENCOUNTER — Encounter (INDEPENDENT_AMBULATORY_CARE_PROVIDER_SITE_OTHER): Payer: Self-pay

## 2014-04-19 DIAGNOSIS — N644 Mastodynia: Secondary | ICD-10-CM

## 2014-04-19 NOTE — Telephone Encounter (Signed)
Patient states that she will decline the steroid injection. She states that she is scared of needles. She states that the pain is not as bad as it was and the medication helps and she is still going to therapy.

## 2014-04-20 ENCOUNTER — Other Ambulatory Visit: Payer: Self-pay | Admitting: Medical

## 2014-04-20 MED ORDER — FLUCONAZOLE 150 MG PO TABS: ORAL_TABLET | ORAL | Status: DC

## 2014-08-21 LAB — HM PAP SMEAR: HM Pap smear: NEGATIVE

## 2014-10-20 ENCOUNTER — Encounter (HOSPITAL_COMMUNITY): Payer: Self-pay | Admitting: General Practice

## 2014-10-25 ENCOUNTER — Other Ambulatory Visit (HOSPITAL_COMMUNITY): Payer: Self-pay | Admitting: Obstetrics and Gynecology

## 2014-10-25 ENCOUNTER — Other Ambulatory Visit: Payer: Self-pay | Admitting: Obstetrics and Gynecology

## 2014-10-25 NOTE — H&P (Deleted)
  The note originally documented on this encounter has been moved the the encounter in which it belongs.  

## 2014-10-25 NOTE — H&P (Signed)
[name] is a 49 y.o.  female P 2-0-2-2 who  presents for hysterocopic removal of an endometrial mass because of post menopausal bleeding.  The patient has been post menopausal for 3 years and therefore amenorrheic until December 2015 when she experienced 5 days of spotting.  The patient only needed a single pantyliner each day  and experienced no cramping.  In June  of 2016 the patient again experience spotting for 5 days She went on to deny any post coital bleeding, changes in bowel or bladder function and had  no vaginitis symptoms.  An endometrial biopsy performed in April 2016 was an inadequate sampling.  A pelvic ultrasound showed:  uterus that had the appearance of adenomyosis and measured:  7.72 x 6.36 x 4.75 cm,  endometrium: 2.07 mm;  left ovary: 2.19 x 1.84 x 1.57 cm and right ovary: 1.97 x 0.98 x 1.13 cm; uterine length from fundus to external os = 8.7.  Due to the patient's post menopausal state, recurrent vaginal bleeding and inadequate endometrial biopsy,  the patient has agreed to proceed with surgical evaluation of her endometrium.   Past Medical History  OB History: G: 4: P: 2-0-2-2;  SVB 1985 and 1988  GYN History: menarche: 49 YO    LMP: menopausal; History of Herpes Simplex 2 and a history of abnormal PAP smear  (2007) treated with cryotherapy.  PAP smears have been normal since with most recent being April 2016  Medical History: Anemia and Benign Left Breast Mass  Surgical History: 1985  Cervical Cryotherapy;  1989  D & C;  1991 Salpingectomy due to Ectopic and Tubal Sterilization; 2010 Hysteroscopy and 2015 Left Breast Biopsy (benign)  Denies problems with anesthesia but had a blood transfusion in 2007.   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   ROS: Admits to glasses and occasional urinary hesitancy but  denies headache, vision changes, nasal congestion, dysphagia, tinnitus, dizziness, hoarseness, cough,  chest pain, shortness of breath, nausea, vomiting, diarrhea,constipation,  urinary frequency, urgency  dysuria, hematuria, vaginitis symptoms, pelvic pain, swelling of joints,easy bruising,  myalgias, arthralgias, skin rashes, unexplained weight loss and except as is mentioned in the history of present illness, patient's review of systems is otherwise negative.   Physical Exam  Bp: 116/58  P: 76  R:20  Temperature: 98.9 degrees F orally  Weight:  200 lbs.  Height: 5'6"   BMI: 32.3  Neck: supple without masses or thyromegaly Lungs: clear to auscultation Heart: regular rate and rhythm Abdomen: soft, non-tender and no organomegaly Pelvic:EGBUS- wnl; vagina-atrophic with brown discharge; uterus-normal size, cervix without lesions or motion tenderness; adnexae-no tenderness or masses Extremities:  no clubbing, cyanosis or edema   Assesment: Post Menopausal Bleeding   Disposition:  A discussion was held with patient regarding the indication for her procedure(s) along with the risks, which include but are not limited to: reaction to anesthesia, damage to adjacent organs, infection and excessive bleeding. The patient verbalized understanding of these risks and has consented to proceed with Hysterocopy with Possible Resection of Endometrial Mass, Dilatation and Curettage at [place] on October 31, 2014   CSN# [id number]   Lomax Poehler J. Florene Glen, PA-C  for Dr. Franklyn Lor. Dillard

## 2014-10-31 ENCOUNTER — Ambulatory Visit (HOSPITAL_COMMUNITY)
Admission: RE | Admit: 2014-10-31 | Discharge: 2014-10-31 | Disposition: A | Discharge status: Home / Self Care | Payer: BLUE CROSS/BLUE SHIELD | Source: Ambulatory Visit | Attending: Obstetrics and Gynecology | Admitting: Obstetrics and Gynecology

## 2014-10-31 ENCOUNTER — Encounter (HOSPITAL_COMMUNITY)
Admission: RE | Disposition: A | Discharge status: Home / Self Care | Payer: Self-pay | Source: Ambulatory Visit | Attending: Obstetrics and Gynecology

## 2014-10-31 ENCOUNTER — Ambulatory Visit (HOSPITAL_COMMUNITY): Payer: BLUE CROSS/BLUE SHIELD | Admitting: Anesthesiology

## 2014-10-31 ENCOUNTER — Encounter (HOSPITAL_COMMUNITY): Payer: Self-pay | Admitting: *Deleted

## 2014-10-31 DIAGNOSIS — N950 Postmenopausal bleeding: Secondary | ICD-10-CM | POA: Insufficient documentation

## 2014-10-31 DIAGNOSIS — N840 Polyp of corpus uteri: Secondary | ICD-10-CM | POA: Diagnosis not present

## 2014-10-31 DIAGNOSIS — F17210 Nicotine dependence, cigarettes, uncomplicated: Secondary | ICD-10-CM | POA: Insufficient documentation

## 2014-10-31 HISTORY — PX: DILATATION & CURRETTAGE/HYSTEROSCOPY WITH RESECTOCOPE: SHX5572

## 2014-10-31 LAB — CBC
HEMATOCRIT: 38 % (ref 36.0–46.0)
HEMOGLOBIN: 12.8 g/dL (ref 12.0–15.0)
MCH: 28.6 pg (ref 26.0–34.0)
MCHC: 33.7 g/dL (ref 30.0–36.0)
MCV: 85 fL (ref 78.0–100.0)
Platelets: 237 10*3/uL (ref 150–400)
RBC: 4.47 MIL/uL (ref 3.87–5.11)
RDW: 12.5 % (ref 11.5–15.5)
WBC: 6.1 10*3/uL (ref 4.0–10.5)

## 2014-10-31 LAB — PREGNANCY, URINE: Preg Test, Ur: NEGATIVE

## 2014-10-31 SURGERY — DILATATION & CURETTAGE/HYSTEROSCOPY WITH RESECTOCOPE
Anesthesia: General | Site: Vagina

## 2014-10-31 MED ORDER — FENTANYL CITRATE (PF) 100 MCG/2ML IJ SOLN
INTRAMUSCULAR | Status: AC
  Filled 2014-10-31: qty 2

## 2014-10-31 MED ORDER — FENTANYL CITRATE (PF) 100 MCG/2ML IJ SOLN
INTRAMUSCULAR | Status: DC | PRN
  Administered 2014-10-31 (×4): 50 ug via INTRAVENOUS

## 2014-10-31 MED ORDER — PROPOFOL 10 MG/ML IV BOLUS
INTRAVENOUS | Status: DC | PRN
  Administered 2014-10-31: 200 mg via INTRAVENOUS

## 2014-10-31 MED ORDER — LIDOCAINE HCL (CARDIAC) 20 MG/ML IV SOLN
INTRAVENOUS | Status: AC
  Filled 2014-10-31: qty 5

## 2014-10-31 MED ORDER — SCOPOLAMINE 1 MG/3DAYS TD PT72
MEDICATED_PATCH | TRANSDERMAL | Status: AC
  Administered 2014-10-31: 1.5 mg via TRANSDERMAL
  Filled 2014-10-31: qty 1

## 2014-10-31 MED ORDER — DIPHENHYDRAMINE HCL 50 MG/ML IJ SOLN
INTRAMUSCULAR | Status: AC
  Filled 2014-10-31: qty 1

## 2014-10-31 MED ORDER — ACETAMINOPHEN 160 MG/5ML PO SOLN
ORAL | Status: AC
  Administered 2014-10-31: 14:00:00
  Filled 2014-10-31: qty 40.6

## 2014-10-31 MED ORDER — MIDAZOLAM HCL 2 MG/2ML IJ SOLN
INTRAMUSCULAR | Status: DC | PRN
  Administered 2014-10-31: 2 mg via INTRAVENOUS

## 2014-10-31 MED ORDER — DEXAMETHASONE SODIUM PHOSPHATE 4 MG/ML IJ SOLN
INTRAMUSCULAR | Status: DC | PRN
  Administered 2014-10-31: 4 mg via INTRAVENOUS

## 2014-10-31 MED ORDER — ONDANSETRON HCL 4 MG/2ML IJ SOLN
INTRAMUSCULAR | Status: AC
  Filled 2014-10-31: qty 2

## 2014-10-31 MED ORDER — LIDOCAINE HCL 2 % IJ SOLN
INTRAMUSCULAR | Status: AC
  Filled 2014-10-31: qty 20

## 2014-10-31 MED ORDER — SODIUM CHLORIDE 0.9 % IR SOLN
Status: DC | PRN
  Administered 2014-10-31: 3000 mL

## 2014-10-31 MED ORDER — KETOROLAC TROMETHAMINE 30 MG/ML IJ SOLN
INTRAMUSCULAR | Status: AC
  Filled 2014-10-31: qty 1

## 2014-10-31 MED ORDER — SILVER NITRATE-POT NITRATE 75-25 % EX MISC
CUTANEOUS | Status: AC
  Filled 2014-10-31: qty 1

## 2014-10-31 MED ORDER — LIDOCAINE HCL (CARDIAC) 20 MG/ML IV SOLN
INTRAVENOUS | Status: DC | PRN
  Administered 2014-10-31: 100 mg via INTRAVENOUS

## 2014-10-31 MED ORDER — PROPOFOL 10 MG/ML IV BOLUS
INTRAVENOUS | Status: AC
  Filled 2014-10-31: qty 20

## 2014-10-31 MED ORDER — DIPHENHYDRAMINE HCL 50 MG/ML IJ SOLN
12.5000 mg | Freq: Once | INTRAMUSCULAR | Status: AC
  Administered 2014-10-31: 12.5 mg via INTRAVENOUS

## 2014-10-31 MED ORDER — LACTATED RINGERS IV SOLN
INTRAVENOUS | Status: DC
  Administered 2014-10-31 (×2): via INTRAVENOUS

## 2014-10-31 MED ORDER — MIDAZOLAM HCL 2 MG/2ML IJ SOLN
INTRAMUSCULAR | Status: AC
  Filled 2014-10-31: qty 2

## 2014-10-31 MED ORDER — KETOROLAC TROMETHAMINE 30 MG/ML IJ SOLN
INTRAMUSCULAR | Status: DC | PRN
  Administered 2014-10-31: 30 mg via INTRAVENOUS

## 2014-10-31 MED ORDER — DEXAMETHASONE SODIUM PHOSPHATE 4 MG/ML IJ SOLN
INTRAMUSCULAR | Status: AC
  Filled 2014-10-31: qty 1

## 2014-10-31 MED ORDER — ACETAMINOPHEN 160 MG/5ML PO SOLN: 975.0000 mg | Freq: Once | ORAL | Status: DC

## 2014-10-31 MED ORDER — SCOPOLAMINE 1 MG/3DAYS TD PT72
1.0000 | MEDICATED_PATCH | Freq: Once | TRANSDERMAL | Status: DC
  Administered 2014-10-31: 1.5 mg via TRANSDERMAL

## 2014-10-31 MED ORDER — ONDANSETRON HCL 4 MG/2ML IJ SOLN
INTRAMUSCULAR | Status: DC | PRN
  Administered 2014-10-31: 4 mg via INTRAVENOUS

## 2014-10-31 MED ORDER — HYDROCODONE-ACETAMINOPHEN 5-325 MG PO TABS: 1.0000 | ORAL_TABLET | Freq: Four times a day (QID) | ORAL | Status: DC | PRN

## 2014-10-31 MED ORDER — DOXYCYCLINE HYCLATE 50 MG PO CAPS: 100.0000 mg | ORAL_CAPSULE | Freq: Two times a day (BID) | ORAL | Status: DC

## 2014-10-31 MED ORDER — FENTANYL CITRATE (PF) 100 MCG/2ML IJ SOLN
25.0000 ug | INTRAMUSCULAR | Status: DC | PRN
  Administered 2014-10-31: 50 ug via INTRAVENOUS

## 2014-10-31 SURGICAL SUPPLY — 21 items
CANISTER SUCT 3000ML (MISCELLANEOUS) ×3 IMPLANT
CATH ROBINSON RED A/P 16FR (CATHETERS) ×3 IMPLANT
CLOTH BEACON ORANGE TIMEOUT ST (SAFETY) ×3 IMPLANT
CONTAINER PREFILL 10% NBF 60ML (FORM) ×6 IMPLANT
DEVICE MYOSURE CLASSIC (MISCELLANEOUS) ×2 IMPLANT
DILATOR CANAL MILEX (MISCELLANEOUS) IMPLANT
ELECT REM PT RETURN 9FT ADLT (ELECTROSURGICAL) ×3
ELECTRODE REM PT RTRN 9FT ADLT (ELECTROSURGICAL) ×1 IMPLANT
GLOVE BIO SURGEON STRL SZ 6.5 (GLOVE) ×2 IMPLANT
GLOVE BIO SURGEONS STRL SZ 6.5 (GLOVE) ×1
GLOVE BIOGEL PI IND STRL 7.0 (GLOVE) ×1 IMPLANT
GLOVE BIOGEL PI INDICATOR 7.0 (GLOVE) ×2
GOWN STRL REUS W/TWL LRG LVL3 (GOWN DISPOSABLE) ×6 IMPLANT
LOOP ANGLED CUTTING 22FR (CUTTING LOOP) IMPLANT
PACK VAGINAL MINOR WOMEN LF (CUSTOM PROCEDURE TRAY) ×3 IMPLANT
PAD OB MATERNITY 4.3X12.25 (PERSONAL CARE ITEMS) ×3 IMPLANT
SYR 20CC LL (SYRINGE) ×3 IMPLANT
TOWEL OR 17X24 6PK STRL BLUE (TOWEL DISPOSABLE) ×6 IMPLANT
TUBING AQUILEX INFLOW (TUBING) ×3 IMPLANT
TUBING AQUILEX OUTFLOW (TUBING) ×3 IMPLANT
WATER STERILE IRR 1000ML POUR (IV SOLUTION) ×3 IMPLANT

## 2014-10-31 NOTE — Op Note (Signed)
Pre op DX: Post-Menopausal Bleeding   Post Op IB:BCWU with endometrial polyp   PHYSICIAN : Cloie Wooden   ASSISTANTS: none   ANESTHESIA:   LMA   ESTIMATED BLOOD LOSS: minimal  LOCAL MEDICATIONS USED NONE  SPECIMEN:  Source of Specimen:  endometrial curettings  DISPOSITION OF SPECIMEN:  PATHOLOGY  COUNTS Correct:  YES    DICTATION #: The patient was taken to the operating room and prepped and draped in a normal sterile fashion. An in out catheter was used to drain the bladder.   A bivalve speculum was placed into the vagina and anterior lip of the cervix was grasped with a single-tooth tenaculum. .  the cervix was then dilated with Pratt dilators up to 21. The hysteroscope was placed into the uterine cavity. The  entire uterus and both ostia were visualized. A POLYP was noted on the posterior wall.  The cervix was further dilated to 31.  myosure was used to remove the polyp.    Hyseroscope was then removed from the uterus. A sharp curettage was then done with a curette and endometrial curettings were obtained. The endometrial curettings and polyp were sent to pathology. Again the hysteroscope was placed into the uterine cavity. . The tenaculum was removed from the cervix and hemostasis was noted.   PLAN OF CARE: discharge to home  PATIENT DISPOSITION:  PACU - hemodynamically stable.

## 2014-10-31 NOTE — Anesthesia Procedure Notes (Signed)
Procedure Name: LMA Insertion Date/Time: 10/31/2014 4:30 PM Performed by: Brock Ra Pre-anesthesia Checklist: Patient identified, Emergency Drugs available, Suction available, Patient being monitored and Timeout performed Patient Re-evaluated:Patient Re-evaluated prior to inductionOxygen Delivery Method: Circle system utilized Preoxygenation: Pre-oxygenation with 100% oxygen Intubation Type: IV induction Ventilation: Mask ventilation without difficulty LMA: LMA inserted LMA Size: 4.0 Grade View: Grade I Number of attempts: 1 Placement Confirmation: positive ETCO2 and breath sounds checked- equal and bilateral

## 2014-10-31 NOTE — Anesthesia Preprocedure Evaluation (Signed)
Anesthesia Evaluation  Patient identified by MRN, date of birth, ID band Patient awake    Reviewed: Allergy & Precautions, H&P , Patient's Chart, lab work & pertinent test results, reviewed documented beta blocker date and time   Airway Mallampati: II  TM Distance: >3 FB Neck ROM: full    Dental no notable dental hx.    Pulmonary Current Smoker,  breath sounds clear to auscultation  Pulmonary exam normal       Cardiovascular Rhythm:regular Rate:Normal     Neuro/Psych    GI/Hepatic   Endo/Other    Renal/GU      Musculoskeletal   Abdominal   Peds  Hematology   Anesthesia Other Findings   Reproductive/Obstetrics                             Anesthesia Physical Anesthesia Plan  ASA: II  Anesthesia Plan:    Post-op Pain Management:    Induction: Intravenous  Airway Management Planned: LMA  Additional Equipment:   Intra-op Plan:   Post-operative Plan:   Informed Consent: I have reviewed the patients History and Physical, chart, labs and discussed the procedure including the risks, benefits and alternatives for the proposed anesthesia with the patient or authorized representative who has indicated his/her understanding and acceptance.   Dental Advisory Given and Dental advisory given  Plan Discussed with: CRNA and Surgeon  Anesthesia Plan Comments: (Discussed GA with LMA, possible sore throat, potential need to switch to ETT, N/V, pulmonary aspiration. Questions answered. )        Anesthesia Quick Evaluation

## 2014-10-31 NOTE — Transfer of Care (Signed)
Immediate Anesthesia Transfer of Care Note  Patient: [name]  Procedure(s) Performed: Procedure(s): DILATATION & CURETTAGE/HYSTEROSCOPY WITH RESECTOCOPE, RESECTION OF POLYP WITH MYOSURE  (N/A)  Patient Location: PACU  Anesthesia Type:General  Level of Consciousness: awake, alert , oriented and patient cooperative  Airway & Oxygen Therapy: Patient Spontanous Breathing and Patient connected to nasal cannula oxygen  Post-op Assessment: Report given to RN and Post -op Vital signs reviewed and stable  Post vital signs: Reviewed and stable, O2 nasal cannula  Last Vitals: TEMP 97.7 BP 108/62 HR 63 RR 13 [id number] 98   Complications: No apparent anesthesia complications

## 2014-10-31 NOTE — Discharge Instructions (Signed)
DISCHARGE INSTRUCTIONS: D&C  The following instructions have been prepared to help you care for yourself upon your return home.  MAY TAKE IBUPROFEN (MOTRIN, ADVIL) OR ALEVE AFTER 10:50 PM FOR PAIN!! TAKE THE PATCH BEHIND YOUR EAR OFF ON 11/02/14.  AFTER REMOVAL [employer] YOUR HANDS VIGOROUSLY!!   Personal hygiene:  Use sanitary pads for vaginal drainage, not tampons.  Shower the day after your procedure.  NO tub baths, pools or Jacuzzis for 2-3 weeks.  Wipe front to back after using the bathroom.  Activity and limitations:  Do NOT drive or operate any equipment for 24 hours. The effects of anesthesia are still present and drowsiness may result.  Do NOT rest in bed all day.  Walking is encouraged.  Walk up and down stairs slowly.  You may resume your normal activity in one to two days or as indicated by your physician.  Sexual activity: NO intercourse for at least 2 weeks after the procedure, or as indicated by your physician.  Diet: Eat a light meal as desired this evening. You may resume your usual diet tomorrow.  Return to work: You may resume your work activities in one to two days or as indicated by your doctor.  What to expect after your surgery: Expect to have vaginal bleeding/discharge for 2-3 days and spotting for up to 10 days. It is not unusual to have soreness for up to 1-2 weeks. You may have a slight burning sensation when you urinate for the first day. Mild cramps may continue for a couple of days. You may have a regular period in 2-6 weeks.  Call your doctor for any of the following:  Excessive vaginal bleeding, saturating and changing one pad every hour.  Inability to urinate 6 hours after discharge from hospital.  Pain not relieved by pain medication.  Fever of 100.4 F or greater.  Unusual vaginal discharge or odor.   Call for an appointment:    Patients signature: ______________________  Nurses signature ________________________  Support person's  signature_______________________

## 2014-11-01 ENCOUNTER — Encounter (HOSPITAL_COMMUNITY): Payer: Self-pay | Admitting: Obstetrics and Gynecology

## 2014-11-02 NOTE — Anesthesia Postprocedure Evaluation (Signed)
Anesthesia Post Note  Patient: [name]  Procedure(s) Performed: Procedure(s) (LRB): DILATATION & CURETTAGE/HYSTEROSCOPY WITH RESECTOCOPE, RESECTION OF POLYP WITH MYOSURE  (N/A)  Anesthesia type: General  Patient location: PACU  Post pain: Pain level controlled  Post assessment: Post-op Vital signs reviewed  Last Vitals:  Filed Vitals:   10/31/14 1824  BP: 125/79  Pulse: 52  Temp:   Resp: 20    Post vital signs: Reviewed  Level of consciousness: sedated  Complications: No apparent anesthesia complications

## 2014-11-07 ENCOUNTER — Other Ambulatory Visit: Payer: Self-pay

## 2014-11-07 DIAGNOSIS — Z1231 Encounter for screening mammogram for malignant neoplasm of breast: Secondary | ICD-10-CM

## 2014-12-01 ENCOUNTER — Other Ambulatory Visit: Payer: Self-pay

## 2014-12-01 ENCOUNTER — Ambulatory Visit
Admission: RE | Admit: 2014-12-01 | Discharge: 2014-12-01 | Disposition: A | Discharge status: Home / Self Care | Payer: BLUE CROSS/BLUE SHIELD | Source: Ambulatory Visit

## 2014-12-01 ENCOUNTER — Other Ambulatory Visit: Payer: Self-pay | Admitting: Medical

## 2014-12-01 DIAGNOSIS — R928 Other abnormal and inconclusive findings on diagnostic imaging of breast: Secondary | ICD-10-CM

## 2014-12-01 DIAGNOSIS — Z1231 Encounter for screening mammogram for malignant neoplasm of breast: Secondary | ICD-10-CM

## 2014-12-27 ENCOUNTER — Ambulatory Visit
Admission: RE | Admit: 2014-12-27 | Discharge: 2014-12-27 | Disposition: A | Discharge status: Home / Self Care | Payer: BLUE CROSS/BLUE SHIELD | Source: Ambulatory Visit | Attending: Medical | Admitting: Medical

## 2014-12-27 ENCOUNTER — Other Ambulatory Visit: Payer: BLUE CROSS/BLUE SHIELD

## 2014-12-27 DIAGNOSIS — R928 Other abnormal and inconclusive findings on diagnostic imaging of breast: Secondary | ICD-10-CM

## 2014-12-28 ENCOUNTER — Other Ambulatory Visit: Payer: Self-pay

## 2014-12-28 MED ORDER — SIMVASTATIN 20 MG PO TABS: 20.0000 mg | ORAL_TABLET | Freq: Every day | ORAL | Status: DC

## 2015-01-16 ENCOUNTER — Ambulatory Visit (INDEPENDENT_AMBULATORY_CARE_PROVIDER_SITE_OTHER): Payer: BLUE CROSS/BLUE SHIELD | Admitting: Medical

## 2015-01-16 ENCOUNTER — Encounter: Payer: Self-pay | Admitting: Medical

## 2015-01-16 VITALS — BP 98/60 | HR 76 | Temp 98.2°F | Resp 18 | Wt 200.4 lb

## 2015-01-16 DIAGNOSIS — E785 Hyperlipidemia, unspecified: Secondary | ICD-10-CM

## 2015-01-16 DIAGNOSIS — Z111 Encounter for screening for respiratory tuberculosis: Secondary | ICD-10-CM | POA: Diagnosis not present

## 2015-01-16 DIAGNOSIS — Z720 Tobacco use: Secondary | ICD-10-CM | POA: Diagnosis not present

## 2015-01-16 DIAGNOSIS — F17200 Nicotine dependence, unspecified, uncomplicated: Secondary | ICD-10-CM

## 2015-01-16 DIAGNOSIS — L989 Disorder of the skin and subcutaneous tissue, unspecified: Secondary | ICD-10-CM | POA: Diagnosis not present

## 2015-01-16 DIAGNOSIS — Z021 Encounter for pre-employment examination: Secondary | ICD-10-CM

## 2015-01-16 LAB — TB SKIN TEST

## 2015-01-16 MED ORDER — SIMVASTATIN 20 MG PO TABS: 20.0000 mg | ORAL_TABLET | Freq: Every day | ORAL | Status: DC

## 2015-01-16 NOTE — Progress Notes (Signed)
   Subjective:   [name] is a 49 y.o. female presenting on 01/16/2015 with form, refill med, TB screening  Here for several concerns.  Sees Dr Charlesetta Garibaldi at [facility].  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  Already owns a day care.   Needs form completed for internship.   Needs PPD screen.  No prior +PPD.     She is here for f/u on hyperlipidemia.  Has been on statin for years without c/o.   Past due for f/u on this.   Is a sometimes smoker.   Exercise is limited.     Has itchy bump at top of butt crack.  No prior drainage, no prior I&D  No other c/o.   Past Medical History  Diagnosis Date  . Anemia   . Hyperlipidemia 2014  . History of blood transfusion 2009    d/t Hg ~4  . Uterine fibroid   . Ulcer as a child    gastric ulcer, unable to take aspirin    Review of Systems ROS as in subjective      Objective:    Filed Vitals:   01/16/15 1225  BP: 98/60  Pulse: 76  Temp: 98.2 F (36.8 C)  Resp: 18    General appearance: alert, no distress, WD/WN Skin: at top of gluteal cleft with raised 47mm round papule, but seems to have underlying fullness, possible pilonidal cyst.   No erythema, fluctuance, or warmth.  HEENT: normocephalic, sclerae anicteric, TMs pearly, nares patent, no discharge or erythema, pharynx normal Oral cavity: MMM, no lesions Neck: supple, no lymphadenopathy, no thyromegaly, no masses Heart: RRR, normal S1, S2, no murmurs Lungs: CTA bilaterally, no wheezes, rhonchi, or rales Abdomen: +bs, soft, non tender, non distended, no masses, no hepatomegaly, no splenomegaly Pulses: 2+ symmetric, upper and lower extremities, normal cap refill Ext: no edema     Assessment: Encounter Diagnoses  Name Primary?  . Hyperlipidemia Yes  . Visit for TB skin test   . Smoker   . Pre-employment examination   . Skin lesion      Plan: Hyperlipidemia - compliant with statin but  past due for physical.  C/t same medication.  She has limited risk factors.  She was on statin prior to coming here as patient.  At her upcoming physical, recheck labs including NMR lipo profile.  discussed low cholesterol diet, stopping tobacco Ppd placed, return in 48-72 hours for PPD reading Completed her employment form Smoker - advised of risks, advised cessation. She is not ready to quit.   Skin lesion - likely pilonidal cyst  Will discuss with supervising physician  [name] was seen today for form, refill med, tb screening.  Diagnoses and all orders for this visit:  Hyperlipidemia  Visit for TB skin test -     PPD  Smoker  Pre-employment examination  Skin lesion  Other orders -     simvastatin (ZOCOR) 20 MG tablet; Take 1 tablet (20 mg total) by mouth daily.   Return 48 hrs for PPD reading, soon for physical.

## 2015-01-17 ENCOUNTER — Telehealth: Payer: Self-pay | Admitting: Medical

## 2015-01-17 NOTE — Telephone Encounter (Signed)
discussed the bump in her butt crack with Dr. Redmond School.   We both presume it is a pilonidal cyst. We would recommend she either leave it alone or can use some Vaseline or lotion on the area if itchy.  If we go messing with it, it will create more problems that she should try to avoid.      (I can't close her chart from Tuesday. Please document the PPD placement so I can close out chart.

## 2015-01-18 NOTE — Telephone Encounter (Signed)
Advised patient of all. She said it is improving. Has appointment Friday for PPD reading.

## 2015-01-19 ENCOUNTER — Ambulatory Visit: Payer: Self-pay | Admitting: Medical

## 2015-01-19 DIAGNOSIS — Z111 Encounter for screening for respiratory tuberculosis: Secondary | ICD-10-CM | POA: Diagnosis not present

## 2015-01-19 NOTE — Addendum Note (Signed)
Addended by: Patience Musca F on: 01/19/2015 04:24 PM   Modules accepted: Orders

## 2015-04-15 ENCOUNTER — Encounter (HOSPITAL_COMMUNITY): Payer: Self-pay | Admitting: Emergency Medicine

## 2015-04-15 ENCOUNTER — Emergency Department (HOSPITAL_COMMUNITY): Payer: BLUE CROSS/BLUE SHIELD

## 2015-04-15 ENCOUNTER — Emergency Department (HOSPITAL_COMMUNITY)
Admission: EM | Admit: 2015-04-15 | Discharge: 2015-04-15 | Disposition: A | Discharge status: Home / Self Care | Payer: BLUE CROSS/BLUE SHIELD | Attending: Emergency Medicine | Admitting: Emergency Medicine

## 2015-04-15 DIAGNOSIS — R51 Headache: Secondary | ICD-10-CM | POA: Diagnosis not present

## 2015-04-15 DIAGNOSIS — Z8719 Personal history of other diseases of the digestive system: Secondary | ICD-10-CM | POA: Diagnosis not present

## 2015-04-15 DIAGNOSIS — M542 Cervicalgia: Secondary | ICD-10-CM | POA: Diagnosis not present

## 2015-04-15 DIAGNOSIS — E785 Hyperlipidemia, unspecified: Secondary | ICD-10-CM | POA: Diagnosis not present

## 2015-04-15 DIAGNOSIS — F17210 Nicotine dependence, cigarettes, uncomplicated: Secondary | ICD-10-CM | POA: Diagnosis not present

## 2015-04-15 DIAGNOSIS — Z862 Personal history of diseases of the blood and blood-forming organs and certain disorders involving the immune mechanism: Secondary | ICD-10-CM | POA: Diagnosis not present

## 2015-04-15 DIAGNOSIS — Z86018 Personal history of other benign neoplasm: Secondary | ICD-10-CM | POA: Diagnosis not present

## 2015-04-15 LAB — I-STAT CHEM 8, ED
BUN: 14 mg/dL (ref 6–20)
CREATININE: 0.7 mg/dL (ref 0.44–1.00)
Calcium, Ion: 1.14 mmol/L (ref 1.12–1.23)
Chloride: 104 mmol/L (ref 101–111)
Glucose, Bld: 139 mg/dL — ABNORMAL HIGH (ref 65–99)
HEMATOCRIT: 43 % (ref 36.0–46.0)
Hemoglobin: 14.6 g/dL (ref 12.0–15.0)
Potassium: 3.7 mmol/L (ref 3.5–5.1)
Sodium: 142 mmol/L (ref 135–145)
TCO2: 27 mmol/L (ref 0–100)

## 2015-04-15 MED ORDER — DIAZEPAM 2 MG PO TABS
2.0000 mg | ORAL_TABLET | Freq: Once | ORAL | Status: AC
  Administered 2015-04-15: 2 mg via ORAL
  Filled 2015-04-15: qty 1

## 2015-04-15 MED ORDER — SODIUM CHLORIDE 0.9 % IV BOLUS (SEPSIS)
1000.0000 mL | Freq: Once | INTRAVENOUS | Status: AC
  Administered 2015-04-15: 1000 mL via INTRAVENOUS

## 2015-04-15 MED ORDER — IOHEXOL 350 MG/ML SOLN
50.0000 mL | Freq: Once | INTRAVENOUS | Status: AC | PRN
  Administered 2015-04-15: 50 mL via INTRAVENOUS

## 2015-04-15 MED ORDER — KETOROLAC TROMETHAMINE 30 MG/ML IJ SOLN
30.0000 mg | Freq: Once | INTRAMUSCULAR | Status: AC
  Administered 2015-04-15: 30 mg via INTRAVENOUS
  Filled 2015-04-15: qty 1

## 2015-04-15 MED ORDER — DIAZEPAM 5 MG PO TABS: 5.0000 mg | ORAL_TABLET | Freq: Two times a day (BID) | ORAL | Status: DC

## 2015-04-15 NOTE — ED Notes (Signed)
Taken to CT at this time. 

## 2015-04-15 NOTE — Discharge Instructions (Signed)
Take 4 over the counter ibuprofen tablets 3 times a day or 2 over-the-counter naproxen tablets twice a day for pain. ° °Musculoskeletal Pain °Musculoskeletal pain is muscle and boney aches and pains. These pains can occur in any part of the body. Your caregiver may treat you without knowing the cause of the pain. They may treat you if blood or urine tests, X-rays, and other tests were normal.  °CAUSES °There is often not a definite cause or reason for these pains. These pains may be caused by a type of germ (virus). The discomfort may also come from overuse. Overuse includes working out too hard when your body is not fit. Boney aches also come from weather changes. Bone is sensitive to atmospheric pressure changes. °HOME CARE INSTRUCTIONS  °· Ask when your test results will be ready. Make sure you get your test results. °· Only take over-the-counter or prescription medicines for pain, discomfort, or fever as directed by your caregiver. If you were given medications for your condition, do not drive, operate machinery or power tools, or sign legal documents for 24 hours. Do not drink alcohol. Do not take sleeping pills or other medications that may interfere with treatment. °· Continue all activities unless the activities cause more pain. When the pain lessens, slowly resume normal activities. Gradually increase the intensity and duration of the activities or exercise. °· During periods of severe pain, bed rest may be helpful. Lay or sit in any position that is comfortable. °· Putting ice on the injured area. °¨ Put ice in a bag. °¨ Place a towel between your skin and the bag. °¨ Leave the ice on for 15 to 20 minutes, 3 to 4 times a day. °· Follow up with your caregiver for continued problems and no reason can be found for the pain. If the pain becomes worse or does not go away, it may be necessary to repeat tests or do additional testing. Your caregiver may need to look further for a possible cause. °SEEK IMMEDIATE  MEDICAL CARE IF: °· You have pain that is getting worse and is not relieved by medications. °· You develop chest pain that is associated with shortness or breath, sweating, feeling sick to your stomach (nauseous), or throw up (vomit). °· Your pain becomes localized to the abdomen. °· You develop any new symptoms that seem different or that concern you. °MAKE SURE YOU:  °· Understand these instructions. °· Will watch your condition. °· Will get help right away if you are not doing well or get worse. °  °This information is not intended to replace advice given to you by your health care provider. Make sure you discuss any questions you have with your health care provider. °  °Document Released: 04/28/2005 Document Revised: 07/21/2011 Document Reviewed: 12/31/2012 °Elsevier Interactive Patient Education ©2016 [employer] ° °

## 2015-04-15 NOTE — ED Notes (Signed)
C/o intermittent headache, R sided neck pain, and R shoulder blade pain x 2 months.  Denies any other symptoms.

## 2015-04-15 NOTE — ED Provider Notes (Signed)
CSN: [id number]     Arrival date & time 04/15/15  1743 History   First MD Initiated Contact with Patient 04/15/15 1758     Chief Complaint  Patient presents with  . Headache  . Neck Pain     (Consider location/radiation/quality/duration/timing/severity/associated sxs/prior Treatment) Patient is a 49 y.o. female presenting with headaches and neck pain. The history is provided by the patient.  Headache Pain location:  Occipital Quality:  Sharp Radiates to:  Does not radiate Severity currently:  9/10 Severity at highest:  9/10 Onset quality:  Gradual Duration:  12 weeks Timing:  Intermittent Progression:  Waxing and waning Chronicity:  New Similar to prior headaches: no   Relieved by:  Nothing Worsened by:  Nothing Ineffective treatments:  None tried Associated symptoms: neck pain   Associated symptoms: no congestion, no dizziness, no fever, no myalgias, no nausea and no vomiting   Neck Pain Associated symptoms: headaches   Associated symptoms: no chest pain and no fever    49 yo F with a chief complaint of right-sided neck pain. This been going on for the past 3 months or more. Patient states that the pain is intermittent so she has not sought medical attention. Pain is usually worse first when she wakes up. Thought to be her pillow and change this out without relief. Patient denies significant injury. Denies chiropractor use. Denies fevers or chills or neck stiffness. Pain is localized to the trapezius attachment to the cervical spine. She states that pain gets so severe that radiates up into her occiput. Denies fevers or chills. Denies nausea or vomiting.  Past Medical History  Diagnosis Date  . Anemia   . Hyperlipidemia 2014  . History of blood transfusion 2009    d/t Hg ~4  . Uterine fibroid   . Ulcer as a child    gastric ulcer, unable to take aspirin   Past Surgical History  Procedure Laterality Date  . Myomectomy    . Ectopic pregnancy surgery    . Tubal ligation     . Tonsillectomy  as a child  . Wisdom tooth extraction Bilateral   . Dilatation & currettage/hysteroscopy with resectocope N/A 10/31/2014    Procedure: DILATATION & CURETTAGE/HYSTEROSCOPY WITH RESECTOCOPE, RESECTION OF POLYP WITH MYOSURE ;  Surgeon: Crawford Givens, MD;  Location: [facility];  Service: Gynecology;  Laterality: N/A;   Family History  Problem Relation Age of Onset  . Hypertension Mother   . Hepatitis Mother     Hepatitis C  . Asthma Sister   . Stroke Maternal Aunt 50    2  . Diabetes Maternal Grandmother   . Heart disease Maternal Grandmother   . Hypertension Maternal Grandmother    Social History  Substance Use Topics  . Smoking status: Current Every Day Smoker -- 0.25 packs/day    Types: Cigarettes  . Smokeless tobacco: Never Used  . Alcohol Use: 0.0 oz/week    0-1 Standard drinks or equivalent per week   OB History    No data available     Review of Systems  Constitutional: Negative for fever and chills.  HENT: Negative for congestion and rhinorrhea.        Neck pain  Eyes: Negative for redness and visual disturbance.  Respiratory: Negative for shortness of breath and wheezing.   Cardiovascular: Negative for chest pain and palpitations.  Gastrointestinal: Negative for nausea and vomiting.  Genitourinary: Negative for dysuria and urgency.  Musculoskeletal: Positive for neck pain. Negative for myalgias and arthralgias.  Skin: Negative for pallor and wound.  Neurological: Positive for headaches. Negative for dizziness.      Allergies  Aspirin  Home Medications   Prior to Admission medications   Medication Sig Start Date End Date Taking? Authorizing Provider  acetaminophen (TYLENOL) 500 MG tablet Take 1,500 mg by mouth at bedtime as needed and may repeat dose one time if needed for mild pain.   Yes Historical Provider, MD  simvastatin (ZOCOR) 20 MG tablet Take 1 tablet (20 mg total) by mouth daily. 01/16/15  Yes Camelia Eng Tysinger, PA-C  diazepam (VALIUM)  5 MG tablet Take 1 tablet (5 mg total) by mouth 2 (two) times daily. 04/15/15   Deno Etienne, DO   BP 128/82 mmHg  Pulse 88  Temp(Src) 98.6 F (37 C) (Oral)  Resp 16  Ht 5\' 6"  (1.676 m)  Wt 197 lb 5 oz (89.5 kg)  BMI 31.86 kg/m2  SpO2 100%  LMP 10/08/2011 Physical Exam  Constitutional: She is oriented to person, place, and time. She appears well-developed and well-nourished. No distress.  HENT:  Head: Normocephalic and atraumatic.  Eyes: EOM are normal. Pupils are equal, round, and reactive to light.  Neck: Normal range of motion. Neck supple.  Cardiovascular: Normal rate and regular rhythm.  Exam reveals no gallop and no friction rub.   No murmur heard. Pulmonary/Chest: Effort normal. She has no wheezes. She has no rales.  Abdominal: Soft. She exhibits no distension. There is no tenderness. There is no rebound and no guarding.  Musculoskeletal: She exhibits tenderness (tenderness worse to the lateral aspect of the lateral masses of the C-spine. Along the right side. Mild spasm to the trapezius.). She exhibits no edema.  Neurological: She is alert and oriented to person, place, and time. She has normal strength. No cranial nerve deficit or sensory deficit. She displays a negative Romberg sign. Coordination and gait normal. GCS eye subscore is 4. GCS verbal subscore is 5. GCS motor subscore is 6. She displays no Babinski's sign on the right side. She displays no Babinski's sign on the left side.  Reflex Scores:      Tricep reflexes are 2+ on the right side and 2+ on the left side.      Bicep reflexes are 2+ on the right side and 2+ on the left side.      Brachioradialis reflexes are 2+ on the right side and 2+ on the left side.      Patellar reflexes are 2+ on the right side and 2+ on the left side.      Achilles reflexes are 2+ on the right side and 2+ on the left side. Benign neuro exam  Skin: Skin is warm and dry. She is not diaphoretic.  Psychiatric: She has a normal mood and affect.  Her behavior is normal.  Nursing note and vitals reviewed.   ED Course  Procedures (including critical care time) Labs Review Labs Reviewed  I-STAT CHEM 8, ED - Abnormal; Notable for the following:    Glucose, Bld 139 (*)    All other components within normal limits    Imaging Review Ct Angio Neck W/cm &/or Wo/cm  04/15/2015  CLINICAL DATA:  Right posterior neck pain EXAM: CT ANGIOGRAPHY NECK TECHNIQUE: Multidetector CT imaging of the neck was performed using the standard protocol during bolus administration of intravenous contrast. Multiplanar CT image reconstructions and MIPs were obtained to evaluate the vascular anatomy. Carotid stenosis measurements (when applicable) are obtained utilizing NASCET criteria, using the distal  internal carotid diameter as the denominator. CONTRAST:  61mL OMNIPAQUE IOHEXOL 350 MG/ML SOLN COMPARISON:  None. FINDINGS: Aortic arch: Aortic arch is normal in caliber. No aneurysm or dissection or atherosclerotic disease. Proximal great vessels patent. Lung apices are clear. Small blebs in the apices bilaterally. Right carotid system: Right carotid artery widely patent without stenosis or dissection. Right carotid bifurcation is normal. Left carotid system: Left carotid is widely patent without stenosis or atherosclerotic disease. Carotid bifurcation widely patent Vertebral arteries:Both vertebral arteries are patent to the basilar. Negative for dissection. No vertebral stenosis. Negative for aneurysm. Skeleton: Disc degeneration and spondylosis C5-6. No acute bony abnormality. Other neck: Negative for mass or adenopathy in the neck. IMPRESSION: Normal CT of the neck. No significant carotid or vertebral artery stenosis or dissection. Cervical spondylosis C5-6. Electronically Signed   By: Franchot Gallo M.D.   On: 04/15/2015 19:58   I have personally reviewed and evaluated these images and lab results as part of my medical decision-making.   EKG Interpretation None       MDM   Final diagnoses:  Neck pain    49 yo F with 3 months of right-sided neck pain. Concern for possible vertebral artery dissection with complaints of pain. Will obtain a CT angio of the neck.  Treat pain and spasm.  If negative feel this is likely secondary to trapezius spasm.  CT angio negative for dissection.  Patient better with symptomatic therapy.  Will discharge home with nsaids, short course of valium for spasm.  PCP follow up.   11:12 PM:  I have discussed the diagnosis/risks/treatment options with the patient and family and believe the pt to be eligible for discharge home to follow-up with PCP. We also discussed returning to the ED immediately if new or worsening sx occur. We discussed the sx which are most concerning (e.g., sudden worsening pain, fever, inability to tolerate by mouth) that necessitate immediate return. Medications administered to the patient during their visit and any new prescriptions provided to the patient are listed below.  Medications given during this visit Medications  sodium chloride 0.9 % bolus 1,000 mL (0 mLs Intravenous Stopped 04/15/15 2024)  ketorolac (TORADOL) 30 MG/ML injection 30 mg (30 mg Intravenous Given 04/15/15 1828)  diazepam (VALIUM) tablet 2 mg (2 mg Oral Given 04/15/15 1834)  iohexol (OMNIPAQUE) 350 MG/ML injection 50 mL (50 mLs Intravenous Contrast Given 04/15/15 1924)    Discharge Medication List as of 04/15/2015  8:06 PM    START taking these medications   Details  diazepam (VALIUM) 5 MG tablet Take 1 tablet (5 mg total) by mouth 2 (two) times daily., Starting 04/15/2015, Until Discontinued, Print        The patient appears reasonably screen and/or stabilized for discharge and I doubt any other medical condition or other [facility] requiring further screening, evaluation, or treatment in the ED at this time prior to discharge.    Deno Etienne, DO 04/15/15 2312

## 2015-04-16 ENCOUNTER — Ambulatory Visit (INDEPENDENT_AMBULATORY_CARE_PROVIDER_SITE_OTHER): Payer: BLUE CROSS/BLUE SHIELD | Admitting: Medical

## 2015-04-16 ENCOUNTER — Encounter: Payer: Self-pay | Admitting: Medical

## 2015-04-16 VITALS — BP 100/64 | HR 74 | Wt 200.0 lb

## 2015-04-16 DIAGNOSIS — M546 Pain in thoracic spine: Secondary | ICD-10-CM | POA: Diagnosis not present

## 2015-04-16 DIAGNOSIS — M792 Neuralgia and neuritis, unspecified: Secondary | ICD-10-CM | POA: Diagnosis not present

## 2015-04-16 DIAGNOSIS — M542 Cervicalgia: Secondary | ICD-10-CM | POA: Diagnosis not present

## 2015-04-16 DIAGNOSIS — M549 Dorsalgia, unspecified: Secondary | ICD-10-CM

## 2015-04-16 MED ORDER — METHOCARBAMOL 500 MG PO TABS: 500.0000 mg | ORAL_TABLET | Freq: Three times a day (TID) | ORAL | Status: DC | PRN

## 2015-04-16 MED ORDER — HYDROCODONE-ACETAMINOPHEN 5-325 MG PO TABS: 1.0000 | ORAL_TABLET | Freq: Four times a day (QID) | ORAL | Status: DC | PRN

## 2015-04-16 NOTE — Progress Notes (Signed)
Subjective: Here for hospital f/u . Went to the ED recently for worsening right neck pain.  She had been having pains for months, but worsened this past week.  Has pain in upper back, pain with neck ROM, stiffness, spasm, gets pains in right arm down to elbows, sometimes tingling and numbness in right upper arm.   No recent fall, trauma, or injury, no lymph nodes enlarged, no fevers, night sweats, weight loss, no other c/o.    Using Valium given by the ED, using OTC Aleve, and nothing seems to help.  Has went to get massage 3 times without relief.    Past Medical History  Diagnosis Date  . Anemia   . Hyperlipidemia 2014  . History of blood transfusion 2009    d/t Hg ~4  . Uterine fibroid   . Ulcer as a child    gastric ulcer, unable to take aspirin   ROS as in subjective   Objective: BP 100/64 mmHg  Pulse 74  Wt 200 lb (90.719 kg)  LMP 10/08/2011  Gen: wd, wn, nad Skin: warm, dry Neck: tender bilat neck, worse R>L, tender posterior neck, pain with ROM in general which seems to be limited to 80% of normal, worse with left lateral flexion and rotation, no mass, no lymphadenopathy, no thyromegaly Upper back tender on right Tender right upper arm, otherwise arm nontender, no deformity Neuro: normal UE strength, sensation ,DTRs  Assessment: Encounter Diagnoses  Name Primary?  . Cervicalgia Yes  . Upper back pain   . Radicular pain in right arm     Plan: Reviewed recent ED report, CT angiogram of neck.  Will touch base with radiology about C spine and radicular symptoms.   Consider MRI if needed.  [name] was seen today for follow-up.  Diagnoses and all orders for this visit:  Cervicalgia  Upper back pain  Radicular pain in right arm  Other orders -     methocarbamol (ROBAXIN) 500 MG tablet; Take 1 tablet (500 mg total) by mouth every 8 (eight) hours as needed for muscle spasms. -     HYDROcodone-acetaminophen (NORCO/VICODIN) 5-325 MG tablet; Take 1 tablet by mouth every 6  (six) hours as needed for moderate pain.

## 2015-06-21 ENCOUNTER — Other Ambulatory Visit: Payer: Self-pay | Admitting: Medical

## 2015-06-22 NOTE — Telephone Encounter (Signed)
Is this ok to refill? Do not see a physical appt for the past year

## 2015-07-09 ENCOUNTER — Encounter: Payer: Self-pay | Admitting: Medical

## 2015-07-09 ENCOUNTER — Ambulatory Visit (INDEPENDENT_AMBULATORY_CARE_PROVIDER_SITE_OTHER): Payer: Managed Care, Other (non HMO) | Admitting: Medical

## 2015-07-09 VITALS — BP 104/68 | HR 69 | Ht 66.0 in | Wt 201.0 lb

## 2015-07-09 DIAGNOSIS — Z1211 Encounter for screening for malignant neoplasm of colon: Secondary | ICD-10-CM | POA: Diagnosis not present

## 2015-07-09 DIAGNOSIS — M7710 Lateral epicondylitis, unspecified elbow: Secondary | ICD-10-CM | POA: Insufficient documentation

## 2015-07-09 DIAGNOSIS — R7301 Impaired fasting glucose: Secondary | ICD-10-CM | POA: Diagnosis not present

## 2015-07-09 DIAGNOSIS — Z720 Tobacco use: Secondary | ICD-10-CM

## 2015-07-09 DIAGNOSIS — Z2821 Immunization not carried out because of patient refusal: Secondary | ICD-10-CM | POA: Diagnosis not present

## 2015-07-09 DIAGNOSIS — Z1159 Encounter for screening for other viral diseases: Secondary | ICD-10-CM

## 2015-07-09 DIAGNOSIS — Z114 Encounter for screening for human immunodeficiency virus [HIV]: Secondary | ICD-10-CM | POA: Diagnosis not present

## 2015-07-09 DIAGNOSIS — R1013 Epigastric pain: Secondary | ICD-10-CM

## 2015-07-09 DIAGNOSIS — Z113 Encounter for screening for infections with a predominantly sexual mode of transmission: Secondary | ICD-10-CM | POA: Insufficient documentation

## 2015-07-09 DIAGNOSIS — E785 Hyperlipidemia, unspecified: Secondary | ICD-10-CM

## 2015-07-09 DIAGNOSIS — F17200 Nicotine dependence, unspecified, uncomplicated: Secondary | ICD-10-CM | POA: Insufficient documentation

## 2015-07-09 DIAGNOSIS — Z0000 Encounter for general adult medical examination without abnormal findings: Secondary | ICD-10-CM

## 2015-07-09 HISTORY — DX: Nicotine dependence, unspecified, uncomplicated: F17.200

## 2015-07-09 HISTORY — DX: Encounter for screening for infections with a predominantly sexual mode of transmission: Z11.3

## 2015-07-09 HISTORY — DX: Epigastric pain: R10.13

## 2015-07-09 HISTORY — DX: Lateral epicondylitis, unspecified elbow: M77.10

## 2015-07-09 LAB — POCT URINALYSIS DIPSTICK
Bilirubin, UA: NEGATIVE
Blood, UA: NEGATIVE
GLUCOSE UA: NEGATIVE
KETONES UA: NEGATIVE
LEUKOCYTES UA: NEGATIVE
NITRITE UA: NEGATIVE
PH UA: 6.5
Protein, UA: NEGATIVE
Spec Grav, UA: 1.025
Urobilinogen, UA: NEGATIVE

## 2015-07-09 LAB — LIPID PANEL
CHOL/HDL RATIO: 5 ratio (ref <=5.0)
CHOLESTEROL: 246 mg/dL — AB (ref 125–200)
HDL: 49 mg/dL (ref >=46)
LDL Cholesterol: 174 mg/dL — ABNORMAL HIGH (ref <130)
Triglycerides: 117 mg/dL (ref <150)
VLDL: 23 mg/dL (ref <30)

## 2015-07-09 LAB — COMPREHENSIVE METABOLIC PANEL
ALT: 21 U/L (ref 6–29)
AST: 17 U/L (ref 10–35)
Albumin: 4.5 g/dL (ref 3.6–5.1)
Alkaline Phosphatase: 72 U/L (ref 33–130)
BUN: 13 mg/dL (ref 7–25)
CALCIUM: 9.7 mg/dL (ref 8.6–10.4)
CO2: 26 mmol/L (ref 20–31)
Chloride: 101 mmol/L (ref 98–110)
Creat: 0.65 mg/dL (ref 0.50–1.05)
GLUCOSE: 88 mg/dL (ref 65–99)
POTASSIUM: 4.2 mmol/L (ref 3.5–5.3)
Sodium: 138 mmol/L (ref 135–146)
Total Bilirubin: 0.5 mg/dL (ref 0.2–1.2)
Total Protein: 7.2 g/dL (ref 6.1–8.1)

## 2015-07-09 LAB — CBC
HCT: 41.8 % (ref 36.0–46.0)
Hemoglobin: 13.9 g/dL (ref 12.0–15.0)
MCH: 28.3 pg (ref 26.0–34.0)
MCHC: 33.3 g/dL (ref 30.0–36.0)
MCV: 85 fL (ref 78.0–100.0)
MPV: 9.2 fL (ref 8.6–12.4)
Platelets: 255 10*3/uL (ref 150–400)
RBC: 4.92 MIL/uL (ref 3.87–5.11)
RDW: 12.9 % (ref 11.5–15.5)
WBC: 6.6 10*3/uL (ref 4.0–10.5)

## 2015-07-09 LAB — HIV ANTIBODY (ROUTINE TESTING W REFLEX): HIV: NONREACTIVE

## 2015-07-09 LAB — HEPATITIS C ANTIBODY: HCV Ab: NEGATIVE

## 2015-07-09 MED ORDER — HYDROCODONE-ACETAMINOPHEN 5-325 MG PO TABS: 1.0000 | ORAL_TABLET | Freq: Four times a day (QID) | ORAL | Status: DC | PRN

## 2015-07-09 MED ORDER — DEXLANSOPRAZOLE 60 MG PO CPDR: 60.0000 mg | DELAYED_RELEASE_CAPSULE | Freq: Every day | ORAL | Status: DC

## 2015-07-09 MED ORDER — METHOCARBAMOL 500 MG PO TABS: 500.0000 mg | ORAL_TABLET | Freq: Three times a day (TID) | ORAL | Status: DC | PRN

## 2015-07-09 NOTE — Progress Notes (Signed)
Subjective:   HPI  [name] is a 50 y.o. female who presents for a complete physical.  Medical care team includes:  Dr. Charlesetta Garibaldi, Gyn  Eye doctor and dentist Glade Lloyd, [facility], PA-C here for primary care  Concerns:   She notes 3-4 weeks of pain in upper abdomen.   Feels some nausea, comes and goes.   That it was indigestion.  Tried club soda, belches, passes gas, sometimes hurts worse at night.  When she stopped the Aleve she was taking for arm pains, this seemed to calmed the pain down.    Still getting pains in bilat elbows.  This has been intermittent since 2015.   Reviewed their medical, surgical, family, social, medication, and allergy history and updated chart as appropriate.  Past Medical History  Diagnosis Date  . Anemia   . Hyperlipidemia 2014  . History of blood transfusion 2009    d/t Hg ~4  . Uterine fibroid   . Ulcer as a child    gastric ulcer, unable to take aspirin  . Encounter for routine gynecological examination     Dr. Charlesetta Garibaldi    Past Surgical History  Procedure Laterality Date  . Myomectomy    . Ectopic pregnancy surgery    . Tubal ligation    . Tonsillectomy  as a child  . Wisdom tooth extraction Bilateral   . Dilatation & currettage/hysteroscopy with resectocope N/A 10/31/2014    Procedure: DILATATION & CURETTAGE/HYSTEROSCOPY WITH RESECTOCOPE, RESECTION OF POLYP WITH MYOSURE ;  Surgeon: Crawford Givens, MD;  Location: [facility];  Service: Gynecology;  Laterality: N/A;    Social History   Social History  . Marital Status: Married    Spouse Name: N/A  . Number of Children: N/A  . Years of Education: N/A   Occupational History  . Not on file.   Social History Main Topics  . Smoking status: Current Every Day Smoker -- 0.25 packs/day for 32 years    Types: Cigarettes  . Smokeless tobacco: Never Used  . Alcohol Use: 0.0 oz/week    0-1 Standard drinks or equivalent per week  . Drug Use: No  . Sexual Activity: Yes    Birth  Control/ Protection: Surgical, None   Other Topics Concern  . 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     Family History  Problem Relation Age of Onset  . Hypertension Mother   . Hepatitis Mother     Hepatitis C  . Asthma Sister   . Stroke Maternal Aunt 50    2  . Diabetes Maternal Grandmother     leg amputation  . Heart disease Maternal Grandmother   . Hypertension Maternal Grandmother   . Cancer Paternal Aunt     breast     Current outpatient prescriptions:  .  acetaminophen (TYLENOL) 500 MG tablet, Take 1,500 mg by mouth at bedtime as needed and may repeat dose one time if needed for mild pain., Disp: , Rfl:  .  simvastatin (ZOCOR) 20 MG tablet, TAKE 1 TABLET (20 MG TOTAL) BY MOUTH DAILY., Disp: 30 tablet, Rfl: 1 .  dexlansoprazole (DEXILANT) 60 MG capsule, Take 1 capsule (60 mg total) by mouth daily., Disp: 10 capsule, Rfl: 0 .  diazepam (VALIUM) 5 MG tablet, Take 1 tablet (5 mg total) by  mouth 2 (two) times daily. (Patient not taking: Reported on 07/09/2015), Disp: 3 tablet, Rfl: 0 .  HYDROcodone-acetaminophen (NORCO/VICODIN) 5-325 MG tablet, Take 1 tablet by mouth every 6 (six) hours as needed for moderate pain., Disp: 15 tablet, Rfl: 0 .  methocarbamol (ROBAXIN) 500 MG tablet, Take 1 tablet (500 mg total) by mouth every 8 (eight) hours as needed for muscle spasms., Disp: 30 tablet, Rfl: 0  Allergies  Allergen Reactions  . Aspirin Nausea Only and Other (See Comments)    Stomach cramping    Review of Systems Constitutional: -fever, -chills, -sweats, -unexpected weight change, -decreased appetite, -fatigue Allergy: -sneezing, -itching, -congestion Dermatology: -changing moles, --rash, -lumps ENT: -runny nose, -ear pain, -sore throat, -hoarseness, -sinus pain, -teeth pain, - ringing in ears,  -hearing loss, -nosebleeds Cardiology: -chest pain, -palpitations, -swelling, -difficulty breathing when lying flat, -waking up short of breath Respiratory: -cough, -shortness of breath, -difficulty breathing with exercise or exertion, -wheezing, -coughing up blood Gastroenterology: +abdominal pain, -nausea, -vomiting, -diarrhea, -constipation, -blood in stool, -changes in bowel movement, -difficulty swallowing or eating Hematology: -bleeding, -bruising  Musculoskeletal: +joint aches, +muscle aches, -joint swelling, -back pain, -neck pain, -cramping, -changes in gait Ophthalmology: denies vision changes, eye redness, itching, discharge Urology: -burning with urination, -difficulty urinating, -blood in urine, -urinary frequency, -urgency, -incontinence Neurology: -headache, -weakness, -tingling, -numbness, -memory loss, -falls, -dizziness Psychology: -depressed mood, -agitation, -sleep problems     Objective:   Physical Exam  BP 104/68 mmHg  Pulse 69  Ht 5\' 6"  (1.676 m)  Wt 201 lb (91.173 kg)  BMI 32.46 kg/m2  LMP 10/08/2011  General appearance: alert, no distress, WD/WN, AA female Skin: scattered macules, scattered skin tags of bilat cheeks, right posterior lower leg with 1.3cm brown raised lesion unchanged per patient, suggestive of seb dermatosis.  No other worrisome lesions HEENT: normocephalic, conjunctiva/corneas normal, sclerae anicteric, PERRLA, EOMi, nares patent, no discharge or erythema, pharynx normal Oral cavity: MMM, tongue normal, teeth normal Neck: supple, no lymphadenopathy, no thyromegaly, no masses, normal ROM, no bruits Chest: non tender, normal shape and expansion Heart: RRR, normal S1, S2, no murmurs Lungs: CTA bilaterally, no wheezes, rhonchi, or rales Abdomen: +bs, soft, mild epigastric tenderness, otherwise non tender, non distended, no masses, no hepatomegaly, no splenomegaly, no bruits Back: mild tenderness bilat lateral epicondyles, otherwise non tender,  normal ROM, no scoliosis Musculoskeletal: upper extremities non tender, no obvious deformity, normal ROM throughout, lower extremities non tender, no obvious deformity, normal ROM throughout Extremities: no edema, no cyanosis, no clubbing Pulses: 2+ symmetric, upper and lower extremities, normal cap refill Neurological: alert, oriented x 3, CN2-12 intact, strength normal upper extremities and lower extremities, sensation normal throughout, DTRs 2+ throughout, no cerebellar signs, gait normal Psychiatric: normal affect, behavior normal, pleasant  Breast/gyn/rectal - deferred to gyn    Assessment and Plan :    Encounter Diagnoses  Name Primary?  . Routine general medical examination at a health care facility Yes  . Smoker   . Hyperlipidemia   . Impaired fasting blood sugar   . Need for hepatitis C screening test   . Screening for HIV (human immunodeficiency virus)   . Influenza vaccination declined   . 23-polyvalent pneumococcal polysaccharide vaccine declined   . Screen for STD (sexually transmitted disease)    Physical exam - discussed healthy lifestyle, diet, exercise, preventative care, vaccinations, and addressed their concerns.  Handout given. Advised smoking cessation.  She is not ready to stop See your eye doctor yearly for routine vision care. See  your dentist yearly for routine dental care including hygiene visits twice yearly. See your gynecologist yearly for routine gynecological care. Begin dexilant samples and avoid gerd trigger foods. Refer to GI for first colonoscopy and possible EGD She declines flu and pneumococcal vaccines She will get me copy of Tdap from 2 years ago from [facility] Tennis elbow - another flare up.  Use rest, ice, robaxin and hydrocodone prn.  If not resolving within 2-3 wk, refer to PT.  This has been an ongoing problem for her.  STD screen today at her request hyperlipidemia - labs today. She wants to stop statin though.    Follow-up pending labs,  referral to GI

## 2015-07-09 NOTE — Patient Instructions (Signed)
Encounter Diagnoses  Name Primary?  . Routine general medical examination at a health care facility Yes  . Smoker   . Hyperlipidemia   . Impaired fasting blood sugar   . Need for hepatitis C screening test   . Screening for HIV (human immunodeficiency virus)   . Influenza vaccination declined   . 23-polyvalent pneumococcal polysaccharide vaccine declined   . Screen for STD (sexually transmitted disease)     Recommendations:  Begin Dexilant samples, once daily for acid for the next 10 days  Avoid acidic foods and spicy foods until this is much better  You can still use Tums as needed  For the left and right tennis elbow, you can use the medications (Hydrocodone for pain and Robaxin muscle relaxer) as needed.    If the elbow pain continues to be a problem despite ice, rest, and medications, we may need to consider referral to physical therapy  We will refer you for the first screening colonoscopy See your eye doctor yearly for routine vision care. See your dentist yearly for routine dental care including hygiene visits twice yearly. See your gynecologist yearly for routine gynecological care. Get me a copy of your last Tetanus vaccine from [place] [employer] STOP SMOKING!!!

## 2015-07-10 ENCOUNTER — Other Ambulatory Visit: Payer: Self-pay | Admitting: Medical

## 2015-07-10 LAB — GC/CHLAMYDIA PROBE AMP
CT Probe RNA: NOT DETECTED
GC Probe RNA: NOT DETECTED

## 2015-07-10 LAB — HEMOGLOBIN A1C
Hgb A1c MFr Bld: 5.9 % — ABNORMAL HIGH (ref <5.7)
MEAN PLASMA GLUCOSE: 123 mg/dL — AB (ref <117)

## 2015-07-10 LAB — RPR

## 2015-07-10 MED ORDER — SIMVASTATIN 20 MG PO TABS: ORAL_TABLET | ORAL | Status: DC

## 2015-07-23 ENCOUNTER — Telehealth: Payer: Self-pay | Admitting: Medical

## 2015-07-23 NOTE — Telephone Encounter (Signed)
Pt called concerning her referral to GI. She states that told her that they can only do Colonoscopy there with her drinking the solution. Pt states she can not drink that due to her stomach problems. She was advised to call us back and let you know.

## 2015-07-23 NOTE — Telephone Encounter (Signed)
Ok, but she should let the GI doctor know in case they want to use some other prep.

## 2015-07-24 NOTE — Telephone Encounter (Signed)
Pt is aware and she is going to call around and find one that will let her get the pills

## 2015-07-24 NOTE — Telephone Encounter (Signed)
Not sure what to do with this other than she needs to make sure the GI doctor knows she has hx/o reflux in the event they want to do EGD.  I can't make the GI doctor do anything or dictate the prep they use unfortunately.

## 2015-07-24 NOTE — Telephone Encounter (Signed)
Pt stated that you told her at her last visit that if she could not drink the solution that they had a pill form she could take. [name] stated that they do no offer any other alternatives besides drinking the solution

## 2015-11-19 ENCOUNTER — Ambulatory Visit: Payer: Managed Care, Other (non HMO) | Admitting: Medical

## 2015-11-20 ENCOUNTER — Ambulatory Visit: Payer: Managed Care, Other (non HMO) | Admitting: Medical

## 2015-11-21 ENCOUNTER — Telehealth: Payer: Self-pay | Admitting: Medical

## 2015-11-21 ENCOUNTER — Ambulatory Visit (INDEPENDENT_AMBULATORY_CARE_PROVIDER_SITE_OTHER): Payer: Managed Care, Other (non HMO) | Admitting: Medical

## 2015-11-21 ENCOUNTER — Encounter: Payer: Self-pay | Admitting: Medical

## 2015-11-21 VITALS — BP 120/80 | HR 70 | Wt 213.0 lb

## 2015-11-21 DIAGNOSIS — N644 Mastodynia: Secondary | ICD-10-CM

## 2015-11-21 DIAGNOSIS — Z87898 Personal history of other specified conditions: Secondary | ICD-10-CM

## 2015-11-21 DIAGNOSIS — R609 Edema, unspecified: Secondary | ICD-10-CM | POA: Diagnosis not present

## 2015-11-21 DIAGNOSIS — R600 Localized edema: Secondary | ICD-10-CM

## 2015-11-21 DIAGNOSIS — M7710 Lateral epicondylitis, unspecified elbow: Secondary | ICD-10-CM

## 2015-11-21 DIAGNOSIS — M7700 Medial epicondylitis, unspecified elbow: Secondary | ICD-10-CM

## 2015-11-21 NOTE — Progress Notes (Signed)
Subjective: Chief Complaint  Patient presents with  . lt breast pain    had a lt breast biopsy last year. has fatty tissues. recurrent    Here for c/o pain in left breast, more underneath, bottom side.  Felt it last week, is a little better this week.  Can't really tell if a knot in breasts.  No nipple drainage, no skin changes on surface.  No knots in armpit.  No fever, no redness, no warmth, no recent illness or trauma.    Pains in elbows has come back.   I saw her prior in 06/2015 and prior to that for bilateral epicondylitis.  She had improvement after last visit but getting the pains again in her elbows.    Having some swelling in feet and hands in general, worse at end of the day.    Does use salt.   Doesn't drink a lot of water.  Drinks fruit punch.   Drinks soda.   Exercise - walks during the day, but no specific exercise.   No feet or hand or finger or toe pain.   Was in [place] last week, did a lot of walking.    Past Medical History  Diagnosis Date  . Anemia   . Hyperlipidemia 2014  . History of blood transfusion 2009    d/t Hg ~4  . Uterine fibroid   . Ulcer as a child    gastric ulcer, unable to take aspirin  . Encounter for routine gynecological examination     Dr. Charlesetta Garibaldi   Past Surgical History  Procedure Laterality Date  . Myomectomy    . Ectopic pregnancy surgery    . Tubal ligation    . Tonsillectomy  as a child  . Wisdom tooth extraction Bilateral   . Dilatation & currettage/hysteroscopy with resectocope N/A 10/31/2014    Procedure: DILATATION & CURETTAGE/HYSTEROSCOPY WITH RESECTOCOPE, RESECTION OF POLYP WITH MYOSURE ;  Surgeon: Crawford Givens, MD;  Location: [facility];  Service: Gynecology;  Laterality: N/A;  . 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       ROS as in subjective   Objective: BP 120/80 mmHg  Pulse 70  Wt 213 lb (96.616 kg)  LMP 10/08/2011  Wt Readings from Last 3 Encounters:  11/21/15 213 lb (96.616 kg)  07/09/15 201 lb (91.173 kg)  04/16/15 200 lb (90.719 kg)   Gen: wd, wn, nad Left breast inferior to areola at 6 o'clock mid lower portio of breast with tender area, maybe slight nodular texture but no distinct mass. otherwise no other mass or nodules, no skin changes worrisome, no axillary or supraclavicular nodes palpable.   There is some raised brown lesions scattered across both lower breasts suggestive of seborrheic keratoses. Exam chaperoned by CMA. Pulses 2+ throughout No obvious edema today    Assessment: Encounter Diagnoses  Name Primary?  . Breast tenderness in female Yes  . History of breast lump   . Lateral epicondylitis of elbow, unspecified laterality   . Edema of extremities      Plan: Referral for diagnostic bi lat mammogram.  Reviewed prior mammogram and breast ultrasounds in the chart from last few years.   In the meantime can use heat, ibuprofen for the discomfort.  Lateral epicondylitis - referral to physical therapy, advised that rest is part of treatment.  I don't think she allows enough rest to get complete healing.  discussed risk factors for epicondylitis  Edema - related to salt intake, need for exercise, and related to prolonged standing at work.  Advised more water intake, cut out added salt and soda, exercise regularly, and elevated legs in evening.   F/u if not seeing improvement

## 2015-11-21 NOTE — Telephone Encounter (Signed)
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

## 2015-11-21 NOTE — Telephone Encounter (Signed)
Faxed to break through

## 2015-12-03 ENCOUNTER — Ambulatory Visit: Admission: RE | Admit: 2015-12-03 | Payer: Managed Care, Other (non HMO) | Source: Ambulatory Visit

## 2015-12-03 ENCOUNTER — Ambulatory Visit
Admission: RE | Admit: 2015-12-03 | Discharge: 2015-12-03 | Disposition: A | Discharge status: Home / Self Care | Payer: Managed Care, Other (non HMO) | Source: Ambulatory Visit | Attending: Medical | Admitting: Medical

## 2016-01-31 ENCOUNTER — Emergency Department (HOSPITAL_COMMUNITY): Payer: Managed Care, Other (non HMO)

## 2016-01-31 ENCOUNTER — Encounter (HOSPITAL_COMMUNITY): Payer: Self-pay | Admitting: Emergency Medicine

## 2016-01-31 ENCOUNTER — Emergency Department (HOSPITAL_COMMUNITY)
Admission: EM | Admit: 2016-01-31 | Discharge: 2016-01-31 | Disposition: A | Discharge status: Home / Self Care | Payer: Managed Care, Other (non HMO) | Attending: Emergency Medicine | Admitting: Emergency Medicine

## 2016-01-31 DIAGNOSIS — F17210 Nicotine dependence, cigarettes, uncomplicated: Secondary | ICD-10-CM | POA: Insufficient documentation

## 2016-01-31 DIAGNOSIS — R0789 Other chest pain: Secondary | ICD-10-CM | POA: Insufficient documentation

## 2016-01-31 LAB — BASIC METABOLIC PANEL
ANION GAP: 12 (ref 5–15)
BUN: 10 mg/dL (ref 6–20)
CALCIUM: 8.8 mg/dL — AB (ref 8.9–10.3)
CO2: 18 mmol/L — AB (ref 22–32)
Chloride: 108 mmol/L (ref 101–111)
Creatinine, Ser: 0.67 mg/dL (ref 0.44–1.00)
GFR calc Af Amer: >60 mL/min (ref >60)
GFR calc non Af Amer: >60 mL/min (ref >60)
GLUCOSE: 99 mg/dL (ref 65–99)
POTASSIUM: 3.9 mmol/L (ref 3.5–5.1)
Sodium: 138 mmol/L (ref 135–145)

## 2016-01-31 LAB — I-STAT CHEM 8, ED
BUN: 11 mg/dL (ref 6–20)
CHLORIDE: 102 mmol/L (ref 101–111)
CREATININE: 0.6 mg/dL (ref 0.44–1.00)
Calcium, Ion: 1.2 mmol/L (ref 1.15–1.40)
GLUCOSE: 88 mg/dL (ref 65–99)
HCT: 41 % (ref 36.0–46.0)
Hemoglobin: 13.9 g/dL (ref 12.0–15.0)
POTASSIUM: 3.8 mmol/L (ref 3.5–5.1)
Sodium: 142 mmol/L (ref 135–145)
TCO2: 29 mmol/L (ref 0–100)

## 2016-01-31 LAB — CBC
HEMATOCRIT: 40.9 % (ref 36.0–46.0)
HEMOGLOBIN: 13.5 g/dL (ref 12.0–15.0)
MCH: 28.4 pg (ref 26.0–34.0)
MCHC: 33 g/dL (ref 30.0–36.0)
MCV: 85.9 fL (ref 78.0–100.0)
Platelets: 205 10*3/uL (ref 150–400)
RBC: 4.76 MIL/uL (ref 3.87–5.11)
RDW: 12.4 % (ref 11.5–15.5)
WBC: 6.6 10*3/uL (ref 4.0–10.5)

## 2016-01-31 LAB — I-STAT TROPONIN, ED
Troponin i, poc: 0 ng/mL (ref 0.00–0.08)
Troponin i, poc: 0 ng/mL (ref 0.00–0.08)

## 2016-01-31 MED ORDER — OXYCODONE-ACETAMINOPHEN 5-325 MG PO TABS
ORAL_TABLET | ORAL | Status: AC
  Filled 2016-01-31: qty 1

## 2016-01-31 MED ORDER — IBUPROFEN 400 MG PO TABS
600.0000 mg | ORAL_TABLET | Freq: Once | ORAL | Status: AC
  Administered 2016-01-31: 600 mg via ORAL
  Filled 2016-01-31: qty 1

## 2016-01-31 MED ORDER — OXYCODONE-ACETAMINOPHEN 5-325 MG PO TABS
1.0000 | ORAL_TABLET | Freq: Once | ORAL | Status: AC
  Administered 2016-01-31: 1 via ORAL

## 2016-01-31 NOTE — ED Notes (Signed)
Family member was given a sprite.

## 2016-01-31 NOTE — ED Triage Notes (Signed)
Pt reports chest pain/tighness onset at 0330, worse with position changes. Denies SHOB, nausea/vomiting, sweating. Rates pain 9/10, states substernal.

## 2016-01-31 NOTE — Discharge Instructions (Signed)
Take Advil as directed for pain. Call for appointment at [facility] adult and pediatric medicine. Ask Mr. Tysinger to help you to stop smoking.

## 2016-01-31 NOTE — ED Notes (Signed)
Pt comfortable with d/c and f/u instructions. No Rx. 

## 2016-01-31 NOTE — ED Notes (Signed)
Rounded on patient, updated and apologized for wait.

## 2016-01-31 NOTE — ED Provider Notes (Signed)
[facility] DEPT Provider Note   CSN: [id number] Arrival date & time: 01/31/16  0426     History   Chief Complaint Chief Complaint  Patient presents with  . Chest Pain    HPI [name] is a 50 y.o. female.  HPI  Complains of anterior chest pain overlying sternum onset upon awakening today at 3:58 AM . Pain is made worse by changing positions improved with remaining still. Nonexertional. No shortness of breath nausea or sweatiness. No lightheadedness. No treatment prior to coming here. She feels improved after treatment with Percocet which she received prior to my exam. No other associated symptoms  Past Medical History:  Diagnosis Date  . Anemia   . Encounter for routine gynecological examination    Dr. Charlesetta Garibaldi  . History of blood transfusion 2009   d/t Hg ~4  . Hyperlipidemia 2014  . Ulcer as a child   gastric ulcer, unable to take aspirin  . Uterine fibroid     Patient Active Problem List   Diagnosis Date Noted  . Routine general medical examination at a health care facility 07/09/2015  . Smoker 07/09/2015  . Hyperlipidemia 07/09/2015  . Impaired fasting blood sugar 07/09/2015  . 23-polyvalent pneumococcal polysaccharide vaccine declined 07/09/2015  . Influenza vaccination declined 07/09/2015  . Screen for STD (sexually transmitted disease) 07/09/2015  . Special screening for malignant neoplasms, colon 07/09/2015  . Lateral epicondylitis of elbow 07/09/2015  . Abdominal pain, epigastric 07/09/2015    Past Surgical History:  Procedure Laterality Date  . BREAST BIOPSY     right  . DILATATION & CURRETTAGE/HYSTEROSCOPY WITH RESECTOCOPE N/A 10/31/2014   Procedure: DILATATION & CURETTAGE/HYSTEROSCOPY WITH RESECTOCOPE, RESECTION OF POLYP WITH MYOSURE ;  Surgeon: Crawford Givens, MD;  Location: [facility];  Service: Gynecology;  Laterality: N/A;  . ECTOPIC PREGNANCY SURGERY    . MYOMECTOMY    . TONSILLECTOMY  as a child  . TUBAL LIGATION    . WISDOM TOOTH  EXTRACTION Bilateral     OB History    No data available       Home Medications    Prior to Admission medications   Medication Sig Start Date End Date Taking? Authorizing Provider  acetaminophen (TYLENOL) 500 MG tablet Take 1,000-1,500 mg by mouth at bedtime as needed and may repeat dose one time if needed for mild pain.    Yes Historical Provider, MD  simvastatin (ZOCOR) 20 MG tablet TAKE 1 TABLET (20 MG TOTAL) BY MOUTH DAILY. 07/10/15  Yes Carlena Hurl, PA-C    Family History Family History  Problem Relation Age of Onset  . Hypertension Mother   . Hepatitis Mother     Hepatitis C  . Asthma Sister   . Stroke Maternal Aunt 50    2  . Diabetes Maternal Grandmother     leg amputation  . Heart disease Maternal Grandmother   . Hypertension Maternal Grandmother   . Cancer Paternal Aunt     breast    Social History Social History  Substance Use Topics  . Smoking status: Current Every Day Smoker    Packs/day: 0.25    Years: 32.00    Types: Cigarettes  . Smokeless tobacco: Never Used  . Alcohol use 0.0 oz/week     Allergies   Aspirin   Review of Systems Review of Systems  Constitutional: Negative.   HENT: Negative.   Respiratory: Negative.   Cardiovascular: Positive for chest pain.  Gastrointestinal: Negative.   Musculoskeletal: Negative.   Skin: Negative.  Neurological: Negative.   Psychiatric/Behavioral: Negative.   All other systems reviewed and are negative.    Physical Exam Updated Vital Signs BP 122/74 (BP Location: Right Arm)   Pulse (!) 58   Temp 97.9 F (36.6 C) (Oral)   Resp 20   Ht 5\' 6"  (1.676 m)   Wt 211 lb 9 oz (96 kg)   LMP 10/08/2011   SpO2 96%   BMI 34.15 kg/m   Physical Exam  Constitutional: She appears well-developed and well-nourished.  HENT:  Head: Normocephalic and atraumatic.  Eyes: Conjunctivae are normal. Pupils are equal, round, and reactive to light.  Neck: Neck supple. No tracheal deviation present. No  thyromegaly present.  Cardiovascular: Normal rate and regular rhythm.   No murmur heard. Pulmonary/Chest: Effort normal and breath sounds normal. She exhibits tenderness.  Anterior chest wall tender. Pain is easily reproduced when she sits up from a supine position  Abdominal: Soft. Bowel sounds are normal. She exhibits no distension. There is no tenderness.  Musculoskeletal: Normal range of motion. She exhibits no edema or tenderness.  Neurological: She is alert. Coordination normal.  Skin: Skin is warm and dry. No rash noted.  Psychiatric: She has a normal mood and affect.  Nursing note and vitals reviewed.    ED Treatments / Results  Labs (all labs ordered are listed, but only abnormal results are displayed) Labs Reviewed  BASIC METABOLIC PANEL - Abnormal; Notable for the following:       Result Value   CO2 18 (*)    Calcium 8.8 (*)    All other components within normal limits  CBC  I-STAT TROPOININ, ED  I-STAT CHEM 8, ED  I-STAT TROPOININ, ED   Chest x-ray viewed by me EKG  EKG Interpretation  Date/Time:  Thursday January 31 2016 04:39:18 EDT Ventricular Rate:  65 PR Interval:  162 QRS Duration: 70 QT Interval:  416 QTC Calculation: 432 R Axis:   37 Text Interpretation:  Normal sinus rhythm Normal ECG No significant change since last tracing Confirmed by Winfred Leeds  MD, Klint Lezcano [phone]) on 01/31/2016 9:15:27 AM       Radiology Dg Chest 2 View  Result Date: 01/31/2016 CLINICAL DATA:  Chest pain today.  Smoker. EXAM: CHEST  2 VIEW COMPARISON:  11/14/2012 FINDINGS: The heart size and mediastinal contours are within normal limits. Both lungs are clear. The visualized skeletal structures are unremarkable. IMPRESSION: No active cardiopulmonary disease. Electronically Signed   By: Lucienne Capers M.D.   On: 01/31/2016 05:19    Procedures Procedures (including critical care time) Results for orders placed or performed during the hospital encounter of [id number]  Basic  metabolic panel  Result Value Ref Range   Sodium 138 135 - 145 mmol/L   Potassium 3.9 3.5 - 5.1 mmol/L   Chloride 108 101 - 111 mmol/L   CO2 18 (L) 22 - 32 mmol/L   Glucose, Bld 99 65 - 99 mg/dL   BUN 10 6 - 20 mg/dL   Creatinine, Ser 0.67 0.44 - 1.00 mg/dL   Calcium 8.8 (L) 8.9 - 10.3 mg/dL   GFR calc non Af Amer >60 >60 mL/min   GFR calc Af Amer >60 >60 mL/min   Anion gap 12 5 - 15  CBC  Result Value Ref Range   WBC 6.6 4.0 - 10.5 K/uL   RBC 4.76 3.87 - 5.11 MIL/uL   Hemoglobin 13.5 12.0 - 15.0 g/dL   HCT 40.9 36.0 - 46.0 %   MCV 85.9 78.0 -  100.0 fL   MCH 28.4 26.0 - 34.0 pg   MCHC 33.0 30.0 - 36.0 g/dL   RDW 12.4 11.5 - 15.5 %   Platelets 205 150 - 400 K/uL  I-stat troponin, ED  Result Value Ref Range   Troponin i, poc 0.00 0.00 - 0.08 ng/mL   Comment 3          I-stat chem 8, ed  Result Value Ref Range   Sodium 142 135 - 145 mmol/L   Potassium 3.8 3.5 - 5.1 mmol/L   Chloride 102 101 - 111 mmol/L   BUN 11 6 - 20 mg/dL   Creatinine, Ser 0.60 0.44 - 1.00 mg/dL   Glucose, Bld 88 65 - 99 mg/dL   Calcium, Ion 1.20 1.15 - 1.40 mmol/L   TCO2 29 0 - 100 mmol/L   Hemoglobin 13.9 12.0 - 15.0 g/dL   HCT 41.0 36.0 - 46.0 %  I-stat troponin, ED  Result Value Ref Range   Troponin i, poc 0.00 0.00 - 0.08 ng/mL   Comment 3           Dg Chest 2 View  Result Date: 01/31/2016 CLINICAL DATA:  Chest pain today.  Smoker. EXAM: CHEST  2 VIEW COMPARISON:  11/14/2012 FINDINGS: The heart size and mediastinal contours are within normal limits. Both lungs are clear. The visualized skeletal structures are unremarkable. IMPRESSION: No active cardiopulmonary disease. Electronically Signed   By: Lucienne Capers M.D.   On: 01/31/2016 05:19   Medications Ordered in ED Medications  oxyCODONE-acetaminophen (PERCOCET/ROXICET) 5-325 MG per tablet (not administered)  ibuprofen (ADVIL,MOTRIN) tablet 600 mg (not administered)  oxyCODONE-acetaminophen (PERCOCET/ROXICET) 5-325 MG per tablet 1 tablet  (1 tablet Oral Given 01/31/16 0505)     Initial Impression / Assessment and Plan / ED Course  I have reviewed the triage vital signs and the nursing notes.  Pertinent labs & imaging results that were available during my care of the patient were reviewed by me and considered in my medical decision making (see chart for details).  Clinical Course   11:20 AM patient resting comfortably after treatment with Percocet and ibuprofen.  Cardiac risk factors include hypercholesterolemia, smoking, otherwise negative. Heart score equals 2. Strongly doubt pulmonary embolism, aortic dissection or aortic aneurysm. Symptoms highly atypical. Exam and history consistent with musculoskeletal chest pain. I counseled patient for 5 minutes on smoking cessation. Ibuprofen for pain Final Clinical Impressions(s) / ED Diagnoses  Plan follow-up with [facility] in adult medicine in order to get help with smoking cessation.  Diagnoses #1 chest wall pain #2 tobacco abuse  Final diagnoses:  None    New Prescriptions New Prescriptions   No medications on file     Orlie Dakin, MD 01/31/16 1127

## 2016-02-29 ENCOUNTER — Encounter: Payer: Self-pay | Admitting: Medical

## 2016-02-29 ENCOUNTER — Ambulatory Visit (INDEPENDENT_AMBULATORY_CARE_PROVIDER_SITE_OTHER): Payer: Self-pay | Admitting: Medical

## 2016-02-29 VITALS — BP 110/74 | HR 74 | Temp 98.1°F | Ht 66.0 in | Wt 215.4 lb

## 2016-02-29 DIAGNOSIS — R059 Cough, unspecified: Secondary | ICD-10-CM

## 2016-02-29 DIAGNOSIS — R52 Pain, unspecified: Secondary | ICD-10-CM

## 2016-02-29 DIAGNOSIS — R6883 Chills (without fever): Secondary | ICD-10-CM

## 2016-02-29 DIAGNOSIS — R05 Cough: Secondary | ICD-10-CM

## 2016-02-29 MED ORDER — KETOCONAZOLE 2 % EX CREA: 1.0000 "application " | TOPICAL_CREAM | Freq: Every day | CUTANEOUS | 1 refills | Status: DC

## 2016-02-29 MED ORDER — AZITHROMYCIN 250 MG PO TABS: ORAL_TABLET | ORAL | 0 refills | Status: DC

## 2016-02-29 MED ORDER — HYDROCODONE-HOMATROPINE 5-1.5 MG/5ML PO SYRP: 5.0000 mL | ORAL_SOLUTION | Freq: Three times a day (TID) | ORAL | 0 refills | Status: DC | PRN

## 2016-02-29 NOTE — Addendum Note (Signed)
Addended by: Carlena Hurl on: 02/29/2016 04:20 PM   Modules accepted: Orders

## 2016-02-29 NOTE — Progress Notes (Signed)
Subjective: Chief Complaint  Patient presents with  . URI    x7 days, HA and neck pain, dry cough, sore throat, hoarness, Nyquil-Tylenol-Cordicin does not seem to alleviate, intermittent fever, no runny nose  . Rash    R leg, patients states comes from moisture   Here for illness.  Started about a week ago, worsening.   She reports sore throat, pounding headache, coughing all the time, fatigue, feels bad, body aches, chills, has had fever, subjective.  No sweats.  Some nausea.   No vomiting, no diarrhea.   No SOB.  Hasn't slept well due to cough.  Feels like choking from cough.  No sick contacts.   Taking coricidin, Nyquil, Tylenol, but feels awful despite this. No other aggravating or relieving factors. No other complaint.   Past Medical History:  Diagnosis Date  . Anemia   . Encounter for routine gynecological examination    Dr. Charlesetta Garibaldi  . History of blood transfusion 2009   d/t Hg ~4  . Hyperlipidemia 2014  . Ulcer ([place]) as a child   gastric ulcer, unable to take aspirin  . Uterine fibroid    Current Outpatient Prescriptions on File Prior to Visit  Medication Sig Dispense Refill  . acetaminophen (TYLENOL) 500 MG tablet Take 1,000-1,500 mg by mouth at bedtime as needed and may repeat dose one time if needed for mild pain.     . simvastatin (ZOCOR) 20 MG tablet TAKE 1 TABLET (20 MG TOTAL) BY MOUTH DAILY. 90 tablet 3   No current facility-administered medications on file prior to visit.    ROS as in subjective   Objective: BP 110/74 (BP Location: Right Arm, Patient Position: Sitting, Cuff Size: Large)   Pulse 74   Temp 98.1 F (36.7 C) (Oral)   Ht 5\' 6"  (1.676 m)   Wt 215 lb 6.4 oz (97.7 kg)   LMP 10/08/2011   SpO2 94%   BMI 34.77 kg/m   General appearance: Alert, WD/WN, no distress, ill appearing                             Skin: warm, no rash, no diaphoresis                           Head: no sinus tenderness                            Eyes: conjunctiva normal,  corneas clear, PERRLA                            Ears: flat TMs, external ear canals normal                          Nose: septum midline, turbinates swollen, with erythema and clear discharge             Mouth/throat: MMM, tongue normal, mild pharyngeal erythema                           Neck: supple, no adenopathy, no thyromegaly, non tender                          Heart: RRR, normal S1, S2, no murmurs  Lungs: +bronchial breath sounds, no rhonchi, no wheezes, no rales                Extremities: no edema, non tender       Assessment: Encounter Diagnoses  Name Primary?  . Cough Yes  . Body aches   . Chills     Plan: Discussed symptoms, exam findings.  She appears ill, pulse ox a little low normal, but no obvious pneumonia on exam and vitals stable.  Advised rest, good hydration, begin medications below.  If worse or not improving in the next 3-4 days, call or return.   Zamari was seen today for uri and rash.  Diagnoses and all orders for this visit:  Cough  Body aches  Chills  Other orders -     azithromycin (ZITHROMAX) 250 MG tablet; 2 tablets day 1, then 1 tablet days 2-4 -     HYDROcodone-homatropine (HYCODAN) 5-1.5 MG/5ML syrup; Take 5 mLs by mouth every 8 (eight) hours as needed for cough.

## 2016-06-30 ENCOUNTER — Encounter: Payer: Self-pay | Admitting: Family Medicine

## 2016-06-30 ENCOUNTER — Ambulatory Visit (INDEPENDENT_AMBULATORY_CARE_PROVIDER_SITE_OTHER): Payer: BLUE CROSS/BLUE SHIELD | Admitting: Family Medicine

## 2016-06-30 VITALS — BP 118/68 | HR 72 | Temp 98.1°F | Wt 216.0 lb

## 2016-06-30 DIAGNOSIS — M5431 Sciatica, right side: Secondary | ICD-10-CM

## 2016-06-30 MED ORDER — NAPROXEN SODIUM 220 MG PO TABS: 220.0000 mg | ORAL_TABLET | Freq: Two times a day (BID) | ORAL | 0 refills | Status: DC

## 2016-06-30 MED ORDER — PREDNISONE 10 MG (21) PO TBPK: 10.0000 mg | ORAL_TABLET | Freq: Every day | ORAL | 0 refills | Status: DC

## 2016-06-30 MED ORDER — SIMVASTATIN 20 MG PO TABS: ORAL_TABLET | ORAL | 0 refills | Status: DC

## 2016-06-30 NOTE — Patient Instructions (Addendum)
Take 1 aleve twice daily with food. Stop the cholesterol medication for the next week.  Take the steroid dose pack.  Follow up if not improving or if symptoms worsen.    Sciatica Sciatica is pain, numbness, weakness, or tingling along the path of the sciatic nerve. The sciatic nerve starts in the lower back and runs down the back of each leg. The nerve controls the muscles in the lower leg and in the back of the knee. It also provides feeling (sensation) to the back of the thigh, the lower leg, and the sole of the foot. Sciatica is a symptom of another medical condition that pinches or puts pressure on the sciatic nerve. Generally, sciatica only affects one side of the body. Sciatica usually goes away on its own or with treatment. In some cases, sciatica may keep coming back (recur). What are the causes? This condition is caused by pressure on the sciatic nerve, or pinching of the sciatic nerve. This may be the result of:  A disk in between the bones of the spine (vertebrae) bulging out too far (herniated disk).  Age-related changes in the spinal disks (degenerative disk disease).  A pain disorder that affects a muscle in the buttock (piriformis syndrome).  Extra bone growth (bone spur) near the sciatic nerve.  An injury or break (fracture) of the pelvis.  Pregnancy.  Tumor (rare). What increases the risk? The following factors may make you more likely to develop this condition:  Playing sports that place pressure or stress on the spine, such as football or weight lifting.  Having poor strength and flexibility.  A history of back injury.  A history of back surgery.  Sitting for long periods of time.  Doing activities that involve repetitive bending or lifting.  Obesity. What are the signs or symptoms? Symptoms can vary from mild to very severe, and they may include:  Any of these problems in the lower back, leg, hip, or buttock:  Mild tingling or dull aches.  Burning  sensations.  Sharp pains.  Numbness in the back of the calf or the sole of the foot.  Leg weakness.  Severe back pain that makes movement difficult. These symptoms may get worse when you cough, sneeze, or laugh, or when you sit or stand for long periods of time. Being overweight may also make symptoms worse. In some cases, symptoms may recur over time. How is this diagnosed? This condition may be diagnosed based on:  Your symptoms.  A physical exam. Your health care provider may ask you to do certain movements to check whether those movements trigger your symptoms.  You may have tests, including:  Blood tests.  X-rays.  MRI.  CT scan. How is this treated? In many cases, this condition improves on its own, without any treatment. However, treatment may include:  Reducing or modifying physical activity during periods of pain.  Exercising and stretching to strengthen your abdomen and improve the flexibility of your spine.  Icing and applying heat to the affected area.  Medicines that help:  To relieve pain and swelling.  To relax your muscles.  Injections of medicines that help to relieve pain, irritation, and inflammation around the sciatic nerve (steroids).  Surgery. Follow these instructions at home: Medicines  Take over-the-counter and prescription medicines only as told by your health care provider.  Do not drive or operate heavy machinery while taking prescription pain medicine. Managing pain  If directed, apply ice to the affected area.  Put ice in  a plastic bag.  Place a towel between your skin and the bag.  Leave the ice on for 20 minutes, 2-3 times a day.  After icing, apply heat to the affected area before you exercise or as often as told by your health care provider. Use the heat source that your health care provider recommends, such as a moist heat pack or a heating pad.  Place a towel between your skin and the heat source.  Leave the heat on  for 20-30 minutes.  Remove the heat if your skin turns bright red. This is especially important if you are unable to feel pain, heat, or cold. You may have a greater risk of getting burned. Activity  Return to your normal activities as told by your health care provider. Ask your health care provider what activities are safe for you.  Avoid activities that make your symptoms worse.  Take brief periods of rest throughout the day. Resting in a lying or standing position is usually better than sitting to rest.  When you rest for longer periods, mix in some mild activity or stretching between periods of rest. This will help to prevent stiffness and pain.  Avoid sitting for long periods of time without moving. Get up and move around at least one time each hour.  Exercise and stretch regularly, as told by your health care provider.  Do not lift anything that is heavier than 10 lb (4.5 kg) while you have symptoms of sciatica. When you do not have symptoms, you should still avoid heavy lifting, especially repetitive heavy lifting.  When you lift objects, always use proper lifting technique, which includes:  Bending your knees.  Keeping the load close to your body.  Avoiding twisting. General instructions  Use good posture.  Avoid leaning forward while sitting.  Avoid hunching over while standing.  Maintain a healthy weight. Excess weight puts extra stress on your back and makes it difficult to maintain good posture.  Wear supportive, comfortable shoes. Avoid wearing high heels.  Avoid sleeping on a mattress that is too soft or too hard. A mattress that is firm enough to support your back when you sleep may help to reduce your pain.  Keep all follow-up visits as told by your health care provider. This is important. Contact a health care provider if:  You have pain that wakes you up when you are sleeping.  You have pain that gets worse when you lie down.  Your pain is worse than  you have experienced in the past.  Your pain lasts longer than 4 weeks.  You experience unexplained weight loss. Get help right away if:  You lose control of your bowel or bladder (incontinence).  You have:  Weakness in your lower back, pelvis, buttocks, or legs that gets worse.  Redness or swelling of your back.  A burning sensation when you urinate. This information is not intended to replace advice given to you by your health care provider. Make sure you discuss any questions you have with your health care provider. Document Released: 04/22/2001 Document Revised: 10/02/2015 Document Reviewed: 01/05/2015 Elsevier Interactive Patient Education  2017 [employer].

## 2016-06-30 NOTE — Progress Notes (Signed)
   Subjective:    Patient ID: [name], female    DOB: Aug 01, 1965, 51 y.o.   MRN: [id number]  HPI Chief Complaint  Patient presents with  . pain    right side pain going down leg, been going on a few months but today is worse. pain in elbows   She is here with complaints of an intermittent sharp and stabbing pain down the back of her right leg for the past several months. States pain is getting worse and is has been constant. Walking makes pain worse. Sitting improves pain.    Denies fever, chills, numbness, tingling or weakness. No loss of control of bowels or bladder.  States she has not taken anything for the pain.   Reviewed allergies, medications, past medical, and social history.   Review of Systems  Pertinent positives and negatives in the history of present illness.     Objective:   Physical Exam  Constitutional: She is oriented to person, place, and time. She appears well-developed and well-nourished. No distress.  Musculoskeletal:       Lumbar back: Normal.  Profound tenderness over right sciatic notch. No erythema or edema. LEs are peripherovascularly intact. Negative straight leg raise.   Neurological: She is alert and oriented to person, place, and time. She has normal strength and normal reflexes. No cranial nerve deficit or sensory deficit. Coordination normal.  Skin: Skin is warm and dry. No erythema. No pallor.   BP 118/68   Pulse 72   Temp 98.1 F (36.7 C) (Oral)   Wt 216 lb (98 kg)   LMP 10/08/2011   BMI 34.86 kg/m       Assessment & Plan:  Sciatica of right side  States she cannot take aspirin but can take ibuprofen or aleve as long as she stops her statin. Reports the NSAID in combination with her statin causes her to have stomach cramping. No sign of infection. No neurological deficits.  Aleve samples given #24. Steroid dose pack prescribed.  Discussed conservative therapy at this point. Will try to get her symptoms to quite down for  now. She will follow up if she worsens or not improving with medication.

## 2016-07-01 ENCOUNTER — Ambulatory Visit: Payer: Self-pay | Admitting: Medical

## 2016-07-02 ENCOUNTER — Telehealth: Payer: Self-pay

## 2016-07-02 ENCOUNTER — Telehealth: Payer: Self-pay | Admitting: Family Medicine

## 2016-07-02 NOTE — Telephone Encounter (Signed)
She can stop the prednisone and continue on the antiinflammatory and see if her stomach issues resolve.

## 2016-07-02 NOTE — Telephone Encounter (Signed)
Pt called and states that the prednisone and is making her sick to her stomach makes her feel like she is going to throw up and makes her stomach have this achy pain, pt can be reached at [phone] and pt uses [id number] - [place], [place] - [address] AT [address]

## 2016-07-02 NOTE — Telephone Encounter (Signed)
Returned call to pt in regards to ABD pain and taking steroids. LMTCB

## 2016-07-02 NOTE — Telephone Encounter (Signed)
Pt was notified of results

## 2016-07-15 ENCOUNTER — Other Ambulatory Visit: Payer: Self-pay | Admitting: Medical

## 2016-09-03 ENCOUNTER — Other Ambulatory Visit: Payer: Self-pay

## 2016-11-11 ENCOUNTER — Other Ambulatory Visit: Payer: Self-pay | Admitting: Medical

## 2016-11-14 ENCOUNTER — Encounter: Payer: Self-pay | Admitting: Family Medicine

## 2016-11-14 ENCOUNTER — Ambulatory Visit (INDEPENDENT_AMBULATORY_CARE_PROVIDER_SITE_OTHER): Payer: BLUE CROSS/BLUE SHIELD | Admitting: Family Medicine

## 2016-11-14 VITALS — BP 110/70 | HR 61 | Ht 66.2 in | Wt 214.2 lb

## 2016-11-14 DIAGNOSIS — R7303 Prediabetes: Secondary | ICD-10-CM

## 2016-11-14 DIAGNOSIS — E785 Hyperlipidemia, unspecified: Secondary | ICD-10-CM

## 2016-11-14 DIAGNOSIS — E669 Obesity, unspecified: Secondary | ICD-10-CM

## 2016-11-14 DIAGNOSIS — F17200 Nicotine dependence, unspecified, uncomplicated: Secondary | ICD-10-CM | POA: Diagnosis not present

## 2016-11-14 LAB — BASIC METABOLIC PANEL
BUN: 13 mg/dL (ref 7–25)
CHLORIDE: 105 mmol/L (ref 98–110)
CO2: 26 mmol/L (ref 20–31)
CREATININE: 0.72 mg/dL (ref 0.50–1.05)
Calcium: 9.1 mg/dL (ref 8.6–10.4)
Glucose, Bld: 89 mg/dL (ref 65–99)
POTASSIUM: 4.1 mmol/L (ref 3.5–5.3)
SODIUM: 139 mmol/L (ref 135–146)

## 2016-11-14 LAB — LIPID PANEL
CHOL/HDL RATIO: 5.2 ratio — AB (ref <5.0)
CHOLESTEROL: 225 mg/dL — AB (ref <200)
HDL: 43 mg/dL — ABNORMAL LOW (ref >50)
LDL Cholesterol: 158 mg/dL — ABNORMAL HIGH (ref <100)
TRIGLYCERIDES: 119 mg/dL (ref <150)
VLDL: 24 mg/dL (ref <30)

## 2016-11-14 MED ORDER — SIMVASTATIN 20 MG PO TABS: 20.0000 mg | ORAL_TABLET | Freq: Every day | ORAL | 0 refills | Status: DC

## 2016-11-14 NOTE — Progress Notes (Signed)
   Subjective:    Patient ID: [name], female    DOB: Jul 08, 1965, 51 y.o.   MRN: [id number]  HPI Chief Complaint  Patient presents with  . med check    med check and fasting labs. switching to a female provider. needs refill on cholesterol   She is here for a med check and is apparently switching to me as her PCP.  Requests refill on simvastatin. Is fasting today and requests blood work.  No other concerns today.  She was not aware that she has a history of prediabetes.   Denies fever, chills, chest pain, palpitations, abdominal pain, N/V/D, urinary symptoms.  No increased thirst or hunger. No unexplained weight loss.   She is aware that she is obese and plans to start exercising.  She is smoking 2-3 cigarettes every evening. Does not want to stop.   Dr. Charlesetta Garibaldi is OB/GYN. States she had a normal pap smear this year.  Is due to get a mammogram. She will call and schedule this.  Colonoscopy- did not get this. States she does not think she could drink the prep.   Does not exercise.  Diet is fairly unhealthy. Drinks grape soda.   She just got degree her child development from [employer]. Has 2 vacations coming up in August.   Reviewed allergies, medications, past medical, surgical,  and social history.   Review of Systems Pertinent positives and negatives in the history of present illness.     Objective:   Physical Exam BP 110/70   Pulse 61   Ht 5' 6.2" (1.681 m)   Wt 214 lb 3.2 oz (97.2 kg)   LMP 10/08/2011   BMI 34.36 kg/m   Alert and in no distress. Not otherwise examined.      Assessment & Plan:  Hyperlipidemia, unspecified hyperlipidemia type - Plan: Basic metabolic panel, Lipid panel  Prediabetes - Plan: Basic metabolic panel, Hemoglobin A1c  Smoker  Obesity (BMI 30-39.9)  She appears to be doing well. She is happy about recent graduation and I congratulated her on this accomplishment.  Counseled on healthy diet and exercise for weight loss,  prediabetes and hyperlipidemia.  Will refill statin and have her continue on this.  Smoking cessation counseling done.  Will follow up pending labs.

## 2016-11-14 NOTE — Patient Instructions (Addendum)
We will call you with lab results.   Start getting at least 150 minutes of physical activity per week.  Cut back on sugary foods, sodas and cut back on portion sizes. Limit carbohydrates. Try to have 45-60 carbs each meal and no more than 15 carbs for snacks.    Steps to Quit Smoking Smoking tobacco can be bad for your health. It can also affect almost every organ in your body. Smoking puts you and people around you at risk for many serious long-lasting (chronic) diseases. Quitting smoking is hard, but it is one of the best things that you can do for your health. It is never too late to quit. What are the benefits of quitting smoking? When you quit smoking, you lower your risk for getting serious diseases and conditions. They can include:  Lung cancer or lung disease.  Heart disease.  Stroke.  Heart attack.  Not being able to have children (infertility).  Weak bones (osteoporosis) and broken bones (fractures).  If you have coughing, wheezing, and shortness of breath, those symptoms may get better when you quit. You may also get sick less often. If you are pregnant, quitting smoking can help to lower your chances of having a baby of low birth weight. What can I do to help me quit smoking? Talk with your doctor about what can help you quit smoking. Some things you can do (strategies) include:  Quitting smoking totally, instead of slowly cutting back how much you smoke over a period of time.  Going to in-person counseling. You are more likely to quit if you go to many counseling sessions.  Using resources and support systems, such as: ? Database administrator with a Social worker. ? Phone quitlines. ? Careers information officer. ? Support groups or group counseling. ? Text messaging programs. ? Mobile phone apps or applications.  Taking medicines. Some of these medicines may have nicotine in them. If you are pregnant or breastfeeding, do not take any medicines to quit smoking unless your  doctor says it is okay. Talk with your doctor about counseling or other things that can help you.  Talk with your doctor about using more than one strategy at the same time, such as taking medicines while you are also going to in-person counseling. This can help make quitting easier. What things can I do to make it easier to quit? Quitting smoking might feel very hard at first, but there is a lot that you can do to make it easier. Take these steps:  Talk to your family and friends. Ask them to support and encourage you.  Call phone quitlines, reach out to support groups, or work with a Social worker.  Ask people who smoke to not smoke around you.  Avoid places that make you want (trigger) to smoke, such as: ? Bars. ? Parties. ? Smoke-break areas at work.  Spend time with people who do not smoke.  Lower the stress in your life. Stress can make you want to smoke. Try these things to help your stress: ? Getting regular exercise. ? Deep-breathing exercises. ? Yoga. ? Meditating. ? Doing a body scan. To do this, close your eyes, focus on one area of your body at a time from head to toe, and notice which parts of your body are tense. Try to relax the muscles in those areas.  Download or buy apps on your mobile phone or tablet that can help you stick to your quit plan. There are many free apps, such as QuitGuide  from the CDC [facility] for Disease Control and Prevention). You can find more support from smokefree.gov and other websites.  This information is not intended to replace advice given to you by your health care provider. Make sure you discuss any questions you have with your health care provider. Document Released: 02/22/2009 Document Revised: 12/25/2015 Document Reviewed: 09/12/2014 Elsevier Interactive Patient Education  2018 [employer].   Prediabetes Prediabetes is the condition of having a blood sugar (blood glucose) level that is higher than it should be, but not high enough for  you to be diagnosed with type 2 diabetes. Having prediabetes puts you at risk for developing type 2 diabetes (type 2 diabetes mellitus). Prediabetes may be called impaired glucose tolerance or impaired fasting glucose. Prediabetes usually does not cause symptoms. Your health care provider can diagnose this condition with blood tests. You may be tested for prediabetes if you are overweight and if you have at least one other risk factor for prediabetes. Risk factors for prediabetes include:  Having a family member with type 2 diabetes.  Being overweight or obese.  Being older than age 49.  Being of American-Indian, African-American, Hispanic/Latino, or Asian/Pacific Islander descent.  Having an inactive (sedentary) lifestyle.  Having a history of gestational diabetes or polycystic ovarian syndrome (PCOS).  Having low levels of good cholesterol (HDL-C) or high levels of blood fats (triglycerides).  Having high blood pressure.  What is blood glucose and how is blood glucose measured?  Blood glucose refers to the amount of glucose in your bloodstream. Glucose comes from eating foods that contain sugars and starches (carbohydrates) that the body breaks down into glucose. Your blood glucose level may be measured in mg/dL (milligrams per deciliter) or mmol/L (millimoles per liter).Your blood glucose may be checked with one or more of the following blood tests:  A fasting blood glucose (FBG) test. You will not be allowed to eat (you will fast) for at least 8 hours before a blood sample is taken. ? A normal range for FBG is 70-100 mg/dl (3.9-5.6 mmol/L).  An A1c (hemoglobin A1c) blood test. This test provides information about blood glucose control over the previous 2?22months.  An oral glucose tolerance test (OGTT). This test measures your blood glucose twice: ? After fasting. This is your baseline level. ? Two hours after you drink a beverage that contains glucose.  You may be diagnosed  with prediabetes:  If your FBG is 100?125 mg/dL (5.6-6.9 mmol/L).  If your A1c level is 5.7?6.4%.  If your OGGT result is 140?199 mg/dL (7.8-11 mmol/L).  These blood tests may be repeated to confirm your diagnosis. What happens if blood glucose is too high? The pancreas produces a hormone (insulin) that helps move glucose from the bloodstream into cells. When cells in the body do not respond properly to insulin that the body makes (insulin resistance), excess glucose builds up in the blood instead of going into cells. As a result, high blood glucose (hyperglycemia) can develop, which can cause many complications. This is a symptom of prediabetes. What can happen if blood glucose stays higher than normal for a long time? Having high blood glucose for a long time is dangerous. Too much glucose in your blood can damage your nerves and blood vessels. Long-term damage can lead to complications from diabetes, which may include:  Heart disease.  Stroke.  Blindness.  Kidney disease.  Depression.  Poor circulation in the feet and legs, which could lead to surgical removal (amputation) in severe cases.  How  can prediabetes be prevented from turning into type 2 diabetes?  To help prevent type 2 diabetes, take the following actions:  Be physically active. ? Do moderate-intensity physical activity for at least 30 minutes on at least 5 days of the week, or as much as told by your health care provider. This could be brisk walking, biking, or water aerobics. ? Ask your health care provider what activities are safe for you. A mix of physical activities may be best, such as walking, swimming, cycling, and strength training.  Lose weight as told by your health care provider. ? Losing 5-7% of your body weight can reverse insulin resistance. ? Your health care provider can determine how much weight loss is best for you and can help you lose weight safely.  Follow a healthy meal plan. This includes  eating lean proteins, complex carbohydrates, fresh fruits and vegetables, low-fat dairy products, and healthy fats. ? Follow instructions from your health care provider about eating or drinking restrictions. ? Make an appointment to see a diet and nutrition specialist (registered dietitian) to help you create a healthy eating plan that is right for you.  Do not smoke or use any tobacco products, such as cigarettes, chewing tobacco, and e-cigarettes. If you need help quitting, ask your health care provider.  Take over-the-counter and prescription medicines as told by your health care provider. You may be prescribed medicines that help lower the risk of type 2 diabetes.  This information is not intended to replace advice given to you by your health care provider. Make sure you discuss any questions you have with your health care provider. Document Released: 08/20/2015 Document Revised: 10/04/2015 Document Reviewed: 06/19/2015 Elsevier Interactive Patient Education  [employer].

## 2016-11-15 LAB — HEMOGLOBIN A1C
Hgb A1c MFr Bld: 5.8 % — ABNORMAL HIGH (ref <5.7)
MEAN PLASMA GLUCOSE: 120 mg/dL

## 2016-11-17 ENCOUNTER — Other Ambulatory Visit: Payer: Self-pay | Admitting: Medical

## 2016-11-17 MED ORDER — SIMVASTATIN 40 MG PO TABS: 40.0000 mg | ORAL_TABLET | Freq: Every day | ORAL | 0 refills | Status: DC

## 2016-11-18 ENCOUNTER — Other Ambulatory Visit: Payer: Self-pay | Admitting: Medical

## 2016-11-18 MED ORDER — SIMVASTATIN 40 MG PO TABS: 40.0000 mg | ORAL_TABLET | Freq: Every day | ORAL | 1 refills | Status: DC

## 2016-11-19 ENCOUNTER — Other Ambulatory Visit: Payer: Self-pay | Admitting: Family Medicine

## 2016-11-19 DIAGNOSIS — Z1231 Encounter for screening mammogram for malignant neoplasm of breast: Secondary | ICD-10-CM

## 2016-11-20 ENCOUNTER — Encounter: Payer: Self-pay | Admitting: Medical

## 2016-12-03 ENCOUNTER — Ambulatory Visit
Admission: RE | Admit: 2016-12-03 | Discharge: 2016-12-03 | Disposition: A | Discharge status: Home / Self Care | Payer: PRIVATE HEALTH INSURANCE | Source: Ambulatory Visit | Attending: Family Medicine | Admitting: Family Medicine

## 2016-12-03 DIAGNOSIS — Z1231 Encounter for screening mammogram for malignant neoplasm of breast: Secondary | ICD-10-CM

## 2017-02-18 ENCOUNTER — Ambulatory Visit (INDEPENDENT_AMBULATORY_CARE_PROVIDER_SITE_OTHER): Payer: BLUE CROSS/BLUE SHIELD | Admitting: Family Medicine

## 2017-02-18 ENCOUNTER — Encounter: Payer: Self-pay | Admitting: Family Medicine

## 2017-02-18 VITALS — BP 120/80 | HR 62 | Temp 98.4°F | Wt 210.0 lb

## 2017-02-18 DIAGNOSIS — R450 Nervousness: Secondary | ICD-10-CM | POA: Diagnosis not present

## 2017-02-18 DIAGNOSIS — R7303 Prediabetes: Secondary | ICD-10-CM | POA: Insufficient documentation

## 2017-02-18 DIAGNOSIS — N644 Mastodynia: Secondary | ICD-10-CM

## 2017-02-18 DIAGNOSIS — E782 Mixed hyperlipidemia: Secondary | ICD-10-CM | POA: Diagnosis not present

## 2017-02-18 LAB — COMPREHENSIVE METABOLIC PANEL
AG Ratio: 2 (calc) (ref 1.0–2.5)
ALT: 25 U/L (ref 6–29)
AST: 20 U/L (ref 10–35)
Albumin: 4.8 g/dL (ref 3.6–5.1)
Alkaline phosphatase (APISO): 73 U/L (ref 33–130)
BILIRUBIN TOTAL: 0.5 mg/dL (ref 0.2–1.2)
BUN: 12 mg/dL (ref 7–25)
CALCIUM: 9.4 mg/dL (ref 8.6–10.4)
CO2: 27 mmol/L (ref 20–32)
Chloride: 103 mmol/L (ref 98–110)
Creat: 0.76 mg/dL (ref 0.50–1.05)
GLUCOSE: 91 mg/dL (ref 65–99)
Globulin: 2.4 g/dL (calc) (ref 1.9–3.7)
Potassium: 4.4 mmol/L (ref 3.5–5.3)
SODIUM: 139 mmol/L (ref 135–146)
TOTAL PROTEIN: 7.2 g/dL (ref 6.1–8.1)

## 2017-02-18 LAB — CBC WITH DIFFERENTIAL/PLATELET
BASOS PCT: 0.4 %
Basophils Absolute: 23 cells/uL (ref 0–200)
EOS PCT: 1.4 %
Eosinophils Absolute: 80 cells/uL (ref 15–500)
HEMATOCRIT: 42.3 % (ref 35.0–45.0)
Hemoglobin: 14 g/dL (ref 11.7–15.5)
LYMPHS ABS: 2582 {cells}/uL (ref 850–3900)
MCH: 27.6 pg (ref 27.0–33.0)
MCHC: 33.1 g/dL (ref 32.0–36.0)
MCV: 83.3 fL (ref 80.0–100.0)
MPV: 10.1 fL (ref 7.5–12.5)
Monocytes Relative: 7.2 %
NEUTROS ABS: 2605 {cells}/uL (ref 1500–7800)
Neutrophils Relative %: 45.7 %
Platelets: 254 10*3/uL (ref 140–400)
RBC: 5.08 10*6/uL (ref 3.80–5.10)
RDW: 12.2 % (ref 11.0–15.0)
Total Lymphocyte: 45.3 %
WBC: 5.7 10*3/uL (ref 3.8–10.8)
WBCMIX: 410 {cells}/uL (ref 200–950)

## 2017-02-18 LAB — POCT GLYCOSYLATED HEMOGLOBIN (HGB A1C): Hemoglobin A1C: 5.8

## 2017-02-18 LAB — TSH: TSH: 1.22 mIU/L

## 2017-02-18 NOTE — Progress Notes (Signed)
   Subjective:    Patient ID: [name], female    DOB: 11/17/1965, 51 y.o.   MRN: [id number]  HPI Chief Complaint  Patient presents with  . pain under breast    pain under breast that comes and goes, doesn't feel an lump or anything. feeling nervous and will eat and then will go away.  declines flu shot   She is here with complaints of a 2 day history of intermittent pain in her left breast. States the pain is a dull ache and the area is tender today. Denies pain today.   Reports history of similar symptoms a few months ago that resolved spontaneously.  Last mammogram was 12/03/2016 and negative.   States she has been feeling jittery 2-3 days per week for the past 2-3 weeks. States after she eats she feels better. History of prediabetes and mixed hyperlipidemia.   Denies fever, chills, fatigue, unexplained weight loss, dizziness, chest pain, palpitations, shortness of breath, abdominal pain, N/V/D, urinary symptoms, LE edema.    Review of Systems Pertinent positives and negatives in the history of present illness.     Objective:   Physical Exam BP 120/80   Pulse 62   Temp 98.4 F (36.9 C) (Oral)   Wt 210 lb (95.3 kg)   LMP 10/08/2011   BMI 33.69 kg/m  Alert and in no distress. Pharyngeal area is normal. Neck is supple without adenopathy or thyromegaly. Cardiac exam shows a regular sinus rhythm without murmurs or gallops. Chest wall is non tender. Left breast is tender along lower border at 7 0'clock position. No mass palpated.  Lungs are clear to auscultation. Abdomen is soft, non distended, normal BS, non tender, no hepatosplenomegaly or masses. Skin is warm and dry, no rash or pallor. CN II-IX intact, no tremor.      Assessment & Plan:  Breast pain, left - Plan: US BREAST LTD UNI LEFT INC AXILLA, MM DIAG BREAST TOMO UNI LEFT  Feeling jittery - Plan: POCT glycosylated hemoglobin (Hb A1C), CBC with Differential/Platelet, Comprehensive metabolic panel,  TSH  Prediabetes - Plan: POCT glycosylated hemoglobin (Hb A1C), CBC with Differential/Platelet, Comprehensive metabolic panel, TSH  Mixed hyperlipidemia - Plan: Lipid panel  Plan to send her for breast US. Reviewed negative mammogram results from 11/2016.  Will check labs due to intermittent jittery feeling.  Hgb A1c 5.8%. She is still is prediabetes range.  Will check fasting lipids.  Follow up pending labs.

## 2017-02-19 ENCOUNTER — Ambulatory Visit
Admission: RE | Admit: 2017-02-19 | Discharge: 2017-02-19 | Disposition: A | Discharge status: Home / Self Care | Payer: BLUE CROSS/BLUE SHIELD | Source: Ambulatory Visit | Attending: Family Medicine | Admitting: Family Medicine

## 2017-02-19 DIAGNOSIS — N644 Mastodynia: Secondary | ICD-10-CM

## 2017-02-19 LAB — LIPID PANEL
CHOLESTEROL: 182 mg/dL (ref <200)
HDL: 48 mg/dL — ABNORMAL LOW (ref >50)
LDL Cholesterol (Calc): 112 mg/dL (calc) — ABNORMAL HIGH
Non-HDL Cholesterol (Calc): 134 mg/dL (calc) — ABNORMAL HIGH (ref <130)
TRIGLYCERIDES: 117 mg/dL (ref <150)
Total CHOL/HDL Ratio: 3.8 (calc) (ref <5.0)

## 2017-03-04 ENCOUNTER — Ambulatory Visit (INDEPENDENT_AMBULATORY_CARE_PROVIDER_SITE_OTHER): Payer: BLUE CROSS/BLUE SHIELD | Admitting: Family Medicine

## 2017-03-04 ENCOUNTER — Encounter: Payer: Self-pay | Admitting: Internal Medicine

## 2017-03-04 ENCOUNTER — Other Ambulatory Visit: Payer: Self-pay | Admitting: Family Medicine

## 2017-03-04 ENCOUNTER — Encounter: Payer: Self-pay | Admitting: Family Medicine

## 2017-03-04 VITALS — BP 122/80 | HR 61 | Ht 67.0 in | Wt 211.8 lb

## 2017-03-04 DIAGNOSIS — E669 Obesity, unspecified: Secondary | ICD-10-CM | POA: Diagnosis not present

## 2017-03-04 DIAGNOSIS — Z0000 Encounter for general adult medical examination without abnormal findings: Secondary | ICD-10-CM

## 2017-03-04 DIAGNOSIS — E782 Mixed hyperlipidemia: Secondary | ICD-10-CM

## 2017-03-04 DIAGNOSIS — Z1211 Encounter for screening for malignant neoplasm of colon: Secondary | ICD-10-CM

## 2017-03-04 LAB — POCT URINALYSIS DIP (PROADVANTAGE DEVICE)
BILIRUBIN UA: NEGATIVE mg/dL
Bilirubin, UA: NEGATIVE
Blood, UA: NEGATIVE
Glucose, UA: NEGATIVE mg/dL
LEUKOCYTES UA: NEGATIVE
Nitrite, UA: NEGATIVE
PH UA: 6 (ref 5.0–8.0)
PROTEIN UA: NEGATIVE mg/dL
Specific Gravity, Urine: 1.03
UUROB: NEGATIVE

## 2017-03-04 NOTE — Patient Instructions (Addendum)
The GI office will call you.   Start getting at least 10-15 minutes of some sort of exercise per day.  Have a weight loss goal of 10 lbs over the next 6 months.   Follow up in 6 months or sooner if needed.    Preventative Care for Adults - Female      MAINTAIN REGULAR HEALTH EXAMS:  A routine yearly physical is a good way to check in with your primary care provider about your health and preventive screening. It is also an opportunity to share updates about your health and any concerns you have, and receive a thorough all-over exam.   Most health insurance companies pay for at least some preventative services.  Check with your health plan for specific coverages.  WHAT PREVENTATIVE SERVICES DO WOMEN NEED?  Adult women should have their weight and blood pressure checked regularly.   Women age 47 and older should have their cholesterol levels checked regularly.  Women should be screened for cervical cancer with a Pap smear and pelvic exam beginning at either age 16, or 3 years after they become sexually activity.    Breast cancer screening generally begins at age 27 with a mammogram and breast exam by your primary care provider.    Beginning at age 78 and continuing to age 53, women should be screened for colorectal cancer.  Certain people may need continued testing until age 40.  Updating vaccinations is part of preventative care.  Vaccinations help protect against diseases such as the flu.  Osteoporosis is a disease in which the bones lose minerals and strength as we age. Women ages 21 and over should discuss this with their caregivers, as should women after menopause who have other risk factors.  Lab tests are generally done as part of preventative care to screen for anemia and blood disorders, to screen for problems with the kidneys and liver, to screen for bladder problems, to check blood sugar, and to check your cholesterol level.  Preventative services generally include  counseling about diet, exercise, avoiding tobacco, drugs, excessive alcohol consumption, and sexually transmitted infections.    GENERAL RECOMMENDATIONS FOR GOOD HEALTH:  Healthy diet:  Eat a variety of foods, including fruit, vegetables, animal or vegetable protein, such as meat, fish, chicken, and eggs, or beans, lentils, tofu, and grains, such as rice.  Drink plenty of water daily.  Decrease saturated fat in the diet, avoid lots of red meat, processed foods, sweets, fast foods, and fried foods.  Exercise:  Aerobic exercise helps maintain good heart health. At least 30-40 minutes of moderate-intensity exercise is recommended. For example, a brisk walk that increases your heart rate and breathing. This should be done on most days of the week.   Find a type of exercise or a variety of exercises that you enjoy so that it becomes a part of your daily life.  Examples are running, walking, swimming, water aerobics, and biking.  For motivation and support, explore group exercise such as aerobic class, spin class, Zumba, Yoga,or  martial arts, etc.    Set exercise goals for yourself, such as a certain weight goal, walk or run in a race such as a 5k walk/run.  Speak to your primary care provider about exercise goals.  Disease prevention:  If you smoke or chew tobacco, find out from your caregiver how to quit. It can literally save your life, no matter how long you have been a tobacco user. If you do not use tobacco, never begin.  Maintain a healthy diet and normal weight. Increased weight leads to problems with blood pressure and diabetes.   The Body Mass Index or BMI is a way of measuring how much of your body is fat. Having a BMI above 27 increases the risk of heart disease, diabetes, hypertension, stroke and other problems related to obesity. Your caregiver can help determine your BMI and based on it develop an exercise and dietary program to help you achieve or maintain this important  measurement at a healthful level.  High blood pressure causes heart and blood vessel problems.  Persistent high blood pressure should be treated with medicine if weight loss and exercise do not work.   Fat and cholesterol leaves deposits in your arteries that can block them. This causes heart disease and vessel disease elsewhere in your body.  If your cholesterol is found to be high, or if you have heart disease or certain other medical conditions, then you may need to have your cholesterol monitored frequently and be treated with medication.   Ask if you should have a cardiac stress test if your history suggests this. A stress test is a test done on a treadmill that looks for heart disease. This test can find disease prior to there being a problem.  Menopause can be associated with physical symptoms and risks. Hormone replacement therapy is available to decrease these. You should talk to your caregiver about whether starting or continuing to take hormones is right for you.   Osteoporosis is a disease in which the bones lose minerals and strength as we age. This can result in serious bone fractures. Risk of osteoporosis can be identified using a bone density scan. Women ages 68 and over should discuss this with their caregivers, as should women after menopause who have other risk factors. Ask your caregiver whether you should be taking a calcium supplement and Vitamin D, to reduce the rate of osteoporosis.   Avoid drinking alcohol in excess (more than two drinks per day).  Avoid use of street drugs. Do not share needles with anyone. Ask for professional help if you need assistance or instructions on stopping the use of alcohol, cigarettes, and/or drugs.  Brush your teeth twice a day with fluoride toothpaste, and floss once a day. Good oral hygiene prevents tooth decay and gum disease. The problems can be painful, unattractive, and can cause other health problems. Visit your dentist for a routine oral  and dental check up and preventive care every 6-12 months.   Look at your skin regularly.  Use a mirror to look at your back. Notify your caregivers of changes in moles, especially if there are changes in shapes, colors, a size larger than a pencil eraser, an irregular border, or development of new moles.  Safety:  Use seatbelts 100% of the time, whether driving or as a passenger.  Use safety devices such as hearing protection if you work in environments with loud noise or significant background noise.  Use safety glasses when doing any work that could send debris in to the eyes.  Use a helmet if you ride a bike or motorcycle.  Use appropriate safety gear for contact sports.  Talk to your caregiver about gun safety.  Use sunscreen with a SPF (or skin protection factor) of 15 or greater.  Lighter skinned people are at a greater risk of skin cancer. Don't forget to also wear sunglasses in order to protect your eyes from too much damaging sunlight. Damaging sunlight can accelerate cataract  formation.   Practice safe sex. Use condoms. Condoms are used for birth control and to help reduce the spread of sexually transmitted infections (or STIs).  Some of the STIs are gonorrhea (the clap), chlamydia, syphilis, trichomonas, herpes, HPV (human papilloma virus) and HIV (human immunodeficiency virus) which causes AIDS. The herpes, HIV and HPV are viral illnesses that have no cure. These can result in disability, cancer and death.   Keep carbon monoxide and smoke detectors in your home functioning at all times. Change the batteries every 6 months or use a model that plugs into the wall.   Vaccinations:  Stay up to date with your tetanus shots and other required immunizations. You should have a booster for tetanus every 10 years. Be sure to get your flu shot every year, since 5%-20% of the [place] population comes down with the flu. The flu vaccine changes each year, so being vaccinated once is not enough. Get your  shot in the fall, before the flu season peaks.   Other vaccines to consider:  Human Papilloma Virus or HPV causes cancer of the cervix, and other infections that can be transmitted from person to person. There is a vaccine for HPV, and females should get immunized between the ages of 43 and 50. It requires a series of 3 shots.   Pneumococcal vaccine to protect against certain types of pneumonia.  This is normally recommended for adults age 83 or older.  However, adults younger than 53 years old with certain underlying conditions such as diabetes, heart or lung disease should also receive the vaccine.  Shingles vaccine to protect against Varicella Zoster if you are older than age 7, or younger than 53 years old with certain underlying illness.  Hepatitis A vaccine to protect against a form of infection of the liver by a virus acquired from food.  Hepatitis B vaccine to protect against a form of infection of the liver by a virus acquired from blood or body fluids, particularly if you work in health care.  If you plan to travel internationally, check with your local health department for specific vaccination recommendations.  Cancer Screening:  Breast cancer screening is essential to preventive care for women. All women age 58 and older should perform a breast self-exam every month. At age 29 and older, women should have their caregiver complete a breast exam each year. Women at ages 76 and older should have a mammogram (x-ray film) of the breasts. Your caregiver can discuss how often you need mammograms.    Cervical cancer screening includes taking a Pap smear (sample of cells examined under a microscope) from the cervix (end of the uterus). It also includes testing for HPV (Human Papilloma Virus, which can cause cervical cancer). Screening and a pelvic exam should begin at age 61, or 3 years after a woman becomes sexually active. Screening should occur every year, with a Pap smear but no HPV  testing, up to age 23. After age 2, you should have a Pap smear every 3 years with HPV testing, if no HPV was found previously.   Most routine colon cancer screening begins at the age of 58. On a yearly basis, doctors may provide special easy to use take-home tests to check for hidden blood in the stool. Sigmoidoscopy or colonoscopy can detect the earliest forms of colon cancer and is life saving. These tests use a small camera at the end of a tube to directly examine the colon. Speak to your caregiver about this at  age 33, when routine screening begins (and is repeated every 5 years unless early forms of pre-cancerous polyps or small growths are found).

## 2017-03-04 NOTE — Progress Notes (Signed)
Subjective:    Patient ID: [name], female    DOB: 1965/08/19, 51 y.o.   MRN: [id number]  HPI Chief Complaint  Patient presents with  . cpe    fasting cpe, no other concerns, declines flu shot, eyes examed within the last year   She is here for a complete physical exam. No longer having breast or chest pain. denies feeling jittery or having any of the symptoms she was having at her last visit.   Other providers: Dr. 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   Diet: nothing particular.  Excerise: none  Immunizations: Flu- refuses   Health maintenance:  Mammogram: up to date.  Colonoscopy: never  Last Gynecological Exam:  Last Menstrual cycle: not in 3-4 years  Last Dental Exam: once annually  Last Eye Exam: needs to have an exam.   Wears seatbelt always, smoke detectors in home and functioning, does not text while driving and feels safe in home environment.   Reviewed allergies, medications, past medical, surgical, family, and social history.   Review of Systems Review of Systems Constitutional: -fever, -chills, -sweats, -unexpected weight change,-fatigue ENT: -runny nose, -ear pain, -sore throat Cardiology:  -chest pain, -palpitations, -edema Respiratory: -cough, -shortness of breath, -wheezing Gastroenterology: -abdominal pain, -nausea, -vomiting, -diarrhea, -constipation  Hematology: -bleeding or bruising problems Musculoskeletal: -arthralgias, -myalgias, -joint swelling, -back pain Ophthalmology: -vision changes Urology: -dysuria, -difficulty urinating, -hematuria, -urinary frequency, -urgency Neurology: -headache, -weakness, -tingling, -numbness       Objective:   Physical Exam BP 122/80   Pulse 61   Ht 5\' 7"  (1.702 m)   Wt 211 lb 12.8 oz (96.1 kg)   LMP 10/08/2011   BMI 33.17 kg/m   General Appearance:    Alert, cooperative, no distress, appears stated  age  Head:    Normocephalic, without obvious abnormality, atraumatic  Eyes:    PERRL, conjunctiva/corneas clear, EOM's intact, fundi    benign  Ears:    Normal TM's and external ear canals  Nose:   Nares normal, mucosa normal, no drainage or sinus   tenderness  Throat:   Lips, mucosa, and tongue normal; teeth and gums normal  Neck:   Supple, no lymphadenopathy;  thyroid:  no   enlargement/tenderness/nodules; no carotid   bruit or JVD  Back:    Spine nontender, no curvature, ROM normal, no CVA     tenderness  Lungs:     Clear to auscultation bilaterally without wheezes, rales or     ronchi; respirations unlabored  Chest Wall:    No tenderness or deformity   Heart:    Regular rate and rhythm, S1 and S2 normal, no murmur, rub   or gallop  Breast Exam:    Done in the the past month and normal mammogram.   Abdomen:     Soft, non-tender, nondistended, normoactive bowel sounds,    no masses, no hepatosplenomegaly  Genitalia:    Done at her OB/GYN     Extremities:   No clubbing, cyanosis or edema  Pulses:   2+ and symmetric all extremities  Skin:   Skin color, texture, turgor normal, no rashes or lesions  Lymph nodes:   Cervical, supraclavicular, and axillary nodes normal  Neurologic:   CNII-XII intact, normal strength, sensation and gait; reflexes 2+ and symmetric throughout          Psych:   Normal mood, affect, hygiene and grooming.  Urinalysis dipstick: negative       Assessment & Plan:  Routine general medical examination at a health care facility - Plan: POCT Urinalysis DIP (Proadvantage Device)  Obesity (BMI 30-39.9)  Screen for colon cancer - Plan: Ambulatory referral to Gastroenterology  Mixed hyperlipidemia  Counseled on healthy diet and exercise and taking time for herself.  Smoking cessation done.  Discussed safety and health promotion.  Immunizations - declines flu shot. Tdap up to date per patient.  Pap smear and mammogram up to date.  GI referral made. She has  never had a colonoscopy.  Follow up in 6 months. Will recheck fasting lipids and I challenged her to lose 10 lbs over the next 6 months by eating healthy and exercise.

## 2017-03-04 NOTE — Progress Notes (Unsigned)
   Subjective:    Patient ID: [name], female    DOB: 1965-11-23, 51 y.o.   MRN: [id number]  HPI    Review of Systems     Objective:   Physical Exam        Assessment & Plan:

## 2017-03-25 ENCOUNTER — Ambulatory Visit (AMBULATORY_SURGERY_CENTER): Payer: Self-pay | Admitting: *Deleted

## 2017-03-25 ENCOUNTER — Other Ambulatory Visit: Payer: Self-pay

## 2017-03-25 VITALS — Ht 66.0 in | Wt 212.4 lb

## 2017-03-25 DIAGNOSIS — Z1211 Encounter for screening for malignant neoplasm of colon: Secondary | ICD-10-CM

## 2017-03-25 NOTE — Progress Notes (Signed)
Patient denies any allergies to egg or soy products. Patient denies complications with anesthesia/sedation.  Patient denies oxygen use at home and denies diet medications. Pamphlet given on colonoscopy. 

## 2017-04-14 ENCOUNTER — Encounter: Payer: Self-pay | Admitting: Internal Medicine

## 2017-04-14 ENCOUNTER — Other Ambulatory Visit: Payer: Self-pay

## 2017-04-14 ENCOUNTER — Ambulatory Visit (AMBULATORY_SURGERY_CENTER): Payer: BLUE CROSS/BLUE SHIELD | Admitting: Internal Medicine

## 2017-04-14 VITALS — BP 138/75 | HR 63 | Temp 96.2°F | Resp 16 | Ht 66.0 in | Wt 212.0 lb

## 2017-04-14 DIAGNOSIS — K621 Rectal polyp: Secondary | ICD-10-CM | POA: Diagnosis not present

## 2017-04-14 DIAGNOSIS — Z1211 Encounter for screening for malignant neoplasm of colon: Secondary | ICD-10-CM

## 2017-04-14 DIAGNOSIS — D124 Benign neoplasm of descending colon: Secondary | ICD-10-CM | POA: Diagnosis not present

## 2017-04-14 DIAGNOSIS — D129 Benign neoplasm of anus and anal canal: Secondary | ICD-10-CM

## 2017-04-14 DIAGNOSIS — D126 Benign neoplasm of colon, unspecified: Secondary | ICD-10-CM

## 2017-04-14 DIAGNOSIS — D128 Benign neoplasm of rectum: Secondary | ICD-10-CM

## 2017-04-14 MED ORDER — SODIUM CHLORIDE 0.9 % IV SOLN: 500.0000 mL | Freq: Once | INTRAVENOUS | Status: DC

## 2017-04-14 NOTE — Progress Notes (Signed)
Pt's states no medical or surgical changes since previsit or office visit. 

## 2017-04-14 NOTE — Op Note (Signed)
[facility] Patient Name: [name] Procedure Date: 04/14/2017 2:23 PM MRN: [id number] Endoscopist: Gatha Mayer , MD Age: 51 Referring MD:  Date of Birth: 11-13-1965 Gender: Female Account #: [id number] Procedure:                Colonoscopy Indications:              Screening for colorectal malignant neoplasm, This                            is the patient's first colonoscopy Medicines:                Propofol per Anesthesia, Monitored Anesthesia Care Procedure:                Pre-Anesthesia Assessment:                           - Prior to the procedure, a History and Physical                            was performed, and patient medications and                            allergies were reviewed. The patient's tolerance of                            previous anesthesia was also reviewed. The risks                            and benefits of the procedure and the sedation                            options and risks were discussed with the patient.                            All questions were answered, and informed consent                            was obtained. Prior Anticoagulants: The patient has                            taken no previous anticoagulant or antiplatelet                            agents. ASA Grade Assessment: II - A patient with                            mild systemic disease. After reviewing the risks                            and benefits, the patient was deemed in                            satisfactory condition to undergo the procedure.  After obtaining informed consent, the colonoscope                            was passed under direct vision. Throughout the                            procedure, the patient's blood pressure, pulse, and                            oxygen saturations were monitored continuously. The                            Colonoscope was introduced through the anus and         advanced to the the cecum, identified by                            appendiceal orifice and ileocecal valve. The                            colonoscopy was performed without difficulty. The                            patient tolerated the procedure well. The quality                            of the bowel preparation was excellent. The                            ileocecal valve, appendiceal orifice, and rectum                            were photographed. The bowel preparation used was                            Miralax. Scope In: 2:28:41 PM Scope Out: 2:50:21 PM Scope Withdrawal Time: 0 hours 18 minutes 50 seconds  Total Procedure Duration: 0 hours 21 minutes 40 seconds  Findings:                 The perianal and digital rectal examinations were                            normal.                           Four sessile polyps were found in the rectum and                            descending colon. The polyps were diminutive in                            size. These polyps were removed with a cold snare.                            Resection and retrieval  were complete. Verification                            of patient identification for the specimen was                            done. Estimated blood loss was minimal.                           The exam was otherwise without abnormality on                            direct and retroflexion views. Complications:            No immediate complications. Estimated Blood Loss:     Estimated blood loss was minimal. Impression:               - Four diminutive polyps in the rectum and in the                            descending colon, removed with a cold snare.                            Resected and retrieved.                           - The examination was otherwise normal on direct                            and retroflexion views. Recommendation:           - Patient has a contact number available for                             emergencies. The signs and symptoms of potential                            delayed complications were discussed with the                            patient. Return to normal activities tomorrow.                            Written discharge instructions were provided to the                            patient.                           - Resume previous diet.                           - Continue present medications.                           - Repeat colonoscopy is recommended. The  colonoscopy date will be determined after pathology                            results from today's exam become available for                            review. Gatha Mayer, MD 04/14/2017 2:58:19 PM This report has been signed electronically.

## 2017-04-14 NOTE — Patient Instructions (Addendum)
   I found and removed 4 tiny polyps.  I will let you know pathology results and when to have another routine colonoscopy by mail and/or My Chart.  I appreciate the opportunity to care for you. Gatha Mayer, MD, [facility]  Handout given : Polyps.  YOU HAD AN ENDOSCOPIC PROCEDURE TODAY AT [facility]:   Refer to the procedure report that was given to you for any specific questions about what was found during the examination.  If the procedure report does not answer your questions, please call your gastroenterologist to clarify.  If you requested that your care partner not be given the details of your procedure findings, then the procedure report has been included in a sealed envelope for you to review at your convenience later.  YOU SHOULD EXPECT: Some feelings of bloating in the abdomen. Passage of more gas than usual.  Walking can help get rid of the air that was put into your GI tract during the procedure and reduce the bloating. If you had a lower endoscopy (such as a colonoscopy or flexible sigmoidoscopy) you may notice spotting of blood in your stool or on the toilet paper. If you underwent a bowel prep for your procedure, you may not have a normal bowel movement for a few days.  Please Note:  You might notice some irritation and congestion in your nose or some drainage.  This is from the oxygen used during your procedure.  There is no need for concern and it should clear up in a day or so.  SYMPTOMS TO REPORT IMMEDIATELY:   Following lower endoscopy (colonoscopy or flexible sigmoidoscopy):  Excessive amounts of blood in the stool  Significant tenderness or worsening of abdominal pains  Swelling of the abdomen that is new, acute  Fever of 100F or higher  For urgent or emergent issues, a gastroenterologist can be reached at any hour by calling [phone].   DIET:  We do recommend a small meal at first, but then you may proceed to your regular diet.  Drink  plenty of fluids but you should avoid alcoholic beverages for 24 hours.  ACTIVITY:  You should plan to take it easy for the rest of today and you should NOT DRIVE or use heavy machinery until tomorrow (because of the sedation medicines used during the test).    FOLLOW UP: Our staff will call the number listed on your records the next business day following your procedure to check on you and address any questions or concerns that you may have regarding the information given to you following your procedure. If we do not reach you, we will leave a message.  However, if you are feeling well and you are not experiencing any problems, there is no need to return our call.  We will assume that you have returned to your regular daily activities without incident.  If any biopsies were taken you will be contacted by phone or by letter within the next 1-3 weeks.  Please call us at [phone] if you have not heard about the biopsies in 3 weeks.    SIGNATURES/CONFIDENTIALITY: You and/or your care partner have signed paperwork which will be entered into your electronic medical record.  These signatures attest to the fact that that the information above on your After Visit Summary has been reviewed and is understood.  Full responsibility of the confidentiality of this discharge information lies with you and/or your care-partner.

## 2017-04-14 NOTE — Progress Notes (Signed)
Spontaneous respirations throughout. VSS. Resting comfortably. To PACU on room air. Report to  RN. 

## 2017-04-14 NOTE — Progress Notes (Signed)
Called to room to assist during endoscopic procedure.  Patient ID and intended procedure confirmed with present staff. Received instructions for my participation in the procedure from the performing physician.  

## 2017-04-15 ENCOUNTER — Telehealth: Payer: Self-pay

## 2017-04-15 NOTE — Telephone Encounter (Signed)
  Follow up Call-  Call back number 04/14/2017  Post procedure Call Back phone  # [phone]  Permission to leave phone message Yes  Some recent data might be hidden     Patient questions:  Do you have a fever, pain , or abdominal swelling? No. Pain Score  0 *  Have you tolerated food without any problems? Yes.    Have you been able to return to your normal activities? Yes.    Do you have any questions about your discharge instructions: Diet   No. Medications  No. Follow up visit  No.  Do you have questions or concerns about your Care? No.  Actions: * If pain score is 4 or above: No action needed, pain <4.

## 2017-04-21 ENCOUNTER — Encounter: Payer: Self-pay | Admitting: Internal Medicine

## 2017-04-21 DIAGNOSIS — Z86010 Personal history of colonic polyps: Secondary | ICD-10-CM

## 2017-04-21 DIAGNOSIS — Z860101 Personal history of adenomatous and serrated colon polyps: Secondary | ICD-10-CM

## 2017-04-21 HISTORY — DX: Personal history of colonic polyps: Z86.010

## 2017-04-21 HISTORY — DX: Personal history of adenomatous and serrated colon polyps: Z86.0101

## 2017-04-21 NOTE — Progress Notes (Signed)
2 adenomas 2 hyperplastic Recall 2023

## 2017-05-04 ENCOUNTER — Other Ambulatory Visit: Payer: Self-pay | Admitting: Family Medicine

## 2017-06-02 DIAGNOSIS — Z114 Encounter for screening for human immunodeficiency virus [HIV]: Secondary | ICD-10-CM | POA: Diagnosis not present

## 2017-06-02 DIAGNOSIS — Z113 Encounter for screening for infections with a predominantly sexual mode of transmission: Secondary | ICD-10-CM | POA: Diagnosis not present

## 2017-06-02 DIAGNOSIS — Z7251 High risk heterosexual behavior: Secondary | ICD-10-CM | POA: Diagnosis not present

## 2017-08-25 DIAGNOSIS — Z6834 Body mass index (BMI) 34.0-34.9, adult: Secondary | ICD-10-CM | POA: Diagnosis not present

## 2017-08-25 DIAGNOSIS — Z124 Encounter for screening for malignant neoplasm of cervix: Secondary | ICD-10-CM | POA: Diagnosis not present

## 2017-08-25 DIAGNOSIS — Z01419 Encounter for gynecological examination (general) (routine) without abnormal findings: Secondary | ICD-10-CM | POA: Diagnosis not present

## 2017-08-26 LAB — HM PAP SMEAR: HM Pap smear: NEGATIVE

## 2017-09-10 ENCOUNTER — Encounter (HOSPITAL_COMMUNITY): Payer: Self-pay | Admitting: Emergency Medicine

## 2017-09-10 ENCOUNTER — Other Ambulatory Visit: Payer: Self-pay

## 2017-09-10 ENCOUNTER — Ambulatory Visit (HOSPITAL_COMMUNITY)
Admission: EM | Admit: 2017-09-10 | Discharge: 2017-09-10 | Disposition: A | Discharge status: Home / Self Care | Payer: BLUE CROSS/BLUE SHIELD | Attending: Family Medicine | Admitting: Family Medicine

## 2017-09-10 DIAGNOSIS — T63441A Toxic effect of venom of bees, accidental (unintentional), initial encounter: Secondary | ICD-10-CM | POA: Diagnosis not present

## 2017-09-10 MED ORDER — PREDNISONE 50 MG PO TABS: 50.0000 mg | ORAL_TABLET | Freq: Every day | ORAL | 0 refills | Status: AC

## 2017-09-10 NOTE — Discharge Instructions (Signed)
Please continue to apply ice or cold compresses to finger  I expect symptoms to resolve after a few hours, but swelling may persist for a day or 2.  If symptoms still persisting in the morning, you may fill prednisone and take for 3 days.  Please take Tylenol and ibuprofen to help with the pain.  You may also try Benadryl or Zyrtec  Please return if symptoms not improving as expected over the next 2 days.  Return sooner if symptoms worsening, develop numbness, worsening redness.

## 2017-09-10 NOTE — ED Provider Notes (Addendum)
[place]    CSN: [id number] Arrival date & time: 09/10/17  1905     History   Chief Complaint Chief Complaint  Patient presents with  . Insect Bite    HPI [name] is a 52 y.o. female presenting today for evaluation after a wasp sting.  Patient was starting approximately 1/2 hours ago to her right index finger.  Since she has had pain, swelling and redness to her finger, pain radiates up into her right arm.  Tingling sensation near site of sting.  Denies loss of sensation.  Has been applying ice.  Denies difficulty breathing or shortness of breath. HPI  Past Medical History:  Diagnosis Date  . Anemia   . Encounter for routine gynecological examination    Dr. Charlesetta Garibaldi  . History of blood transfusion 2009   d/t Hg ~4  . Hx of adenomatous colonic polyps 04/21/2017  . Hyperlipidemia 2014  . SVD (spontaneous vaginal delivery)    x 2  . Ulcer as a child   gastric ulcer, unable to take aspirin  . Uterine fibroid     Patient Active Problem List   Diagnosis Date Noted  . Hx of adenomatous colonic polyps 04/21/2017  . Prediabetes 02/18/2017  . Routine general medical examination at a health care facility 07/09/2015  . Smoker 07/09/2015  . Hyperlipidemia 07/09/2015  . 23-polyvalent pneumococcal polysaccharide vaccine declined 07/09/2015  . Influenza vaccination declined 07/09/2015  . Screen for STD (sexually transmitted disease) 07/09/2015  . Special screening for malignant neoplasms, colon 07/09/2015  . Lateral epicondylitis of elbow 07/09/2015  . Abdominal pain, epigastric 07/09/2015    Past Surgical History:  Procedure Laterality Date  . BREAST BIOPSY Left   . DILATATION & CURRETTAGE/HYSTEROSCOPY WITH RESECTOCOPE N/A 10/31/2014   Procedure: DILATATION & CURETTAGE/HYSTEROSCOPY WITH RESECTOCOPE, RESECTION OF POLYP WITH MYOSURE ;  Surgeon: Crawford Givens, MD;  Location: [facility];  Service: Gynecology;  Laterality: N/A;  . ECTOPIC PREGNANCY SURGERY      . MYOMECTOMY    . TONSILLECTOMY  as a child  . TUBAL LIGATION    . WISDOM TOOTH EXTRACTION Bilateral     OB History   None      Home Medications    Prior to Admission medications   Medication Sig Start Date End Date Taking? Authorizing Provider  acetaminophen (TYLENOL) 500 MG tablet Take 1,000 mg as needed by mouth for mild pain, moderate pain, fever or headache.    [provider]  predniSONE (DELTASONE) 50 MG tablet Take 1 tablet (50 mg total) by mouth daily for 3 days. 09/10/17 09/13/17  Tita Terhaar C, PA-C  simvastatin (ZOCOR) 40 MG tablet TAKE 1 TABLET AT BEDTIME 05/04/17   Girtha Rm, NP-C    Family History Family History  Problem Relation Age of Onset  . Hypertension Mother   . Hepatitis Mother        Hepatitis C  . Asthma Sister   . Stroke Maternal Aunt 50       2  . Diabetes Maternal Grandmother        leg amputation  . Heart disease Maternal Grandmother   . Hypertension Maternal Grandmother   . Cancer Paternal Aunt        breast  . Breast cancer Paternal Aunt   . Colon cancer Neg Hx   . Rectal cancer Neg Hx   . Stomach cancer Neg Hx   . Colon polyps Neg Hx     Social History Social History  Tobacco Use  . Smoking status: Current Every Day Smoker    Packs/day: 0.25    Years: 36.00    Pack years: 9.00    Types: Cigarettes  . Smokeless tobacco: Never Used  Substance Use Topics  . Alcohol use: Yes    Comment: 2 drinks /week  . Drug use: No     Allergies   Aspirin   Review of Systems Review of Systems  Constitutional: Negative for fatigue and fever.  HENT: Negative for facial swelling and trouble swallowing.   Eyes: Negative for visual disturbance.  Respiratory: Negative for shortness of breath.   Cardiovascular: Negative for chest pain.  Musculoskeletal: Positive for joint swelling. Negative for myalgias.  Skin: Positive for color change. Negative for pallor and wound.  Neurological: Negative for dizziness, syncope,  speech difficulty, weakness, light-headedness and headaches.     Physical Exam Triage Vital Signs ED Triage Vitals  Enc Vitals Group     BP 09/10/17 1928 125/80     Pulse Rate 09/10/17 1928 70     Resp 09/10/17 1928 18     Temp 09/10/17 1928 98.7 F (37.1 C)     Temp Source 09/10/17 1928 Oral     SpO2 09/10/17 1928 97 %     Weight --      Height --      Head Circumference --      Peak Flow --      Pain Score 09/10/17 1927 8     Pain Loc --      Pain Edu? --      Excl. in [place]? --    No data found.  Updated Vital Signs BP 125/80 (BP Location: Left Arm)   Pulse 70   Temp 98.7 F (37.1 C) (Oral)   Resp 18   LMP 10/08/2011   SpO2 97%   Visual Acuity Right Eye Distance:   Left Eye Distance:   Bilateral Distance:    Right Eye Near:   Left Eye Near:    Bilateral Near:     Physical Exam  Constitutional: She appears well-developed and well-nourished. No distress.  No acute distress  HENT:  Head: Normocephalic and atraumatic.  Maintaining airway well  Eyes: Conjunctivae are normal.  Neck: Neck supple.  Cardiovascular: Normal rate and regular rhythm.  No murmur heard. Pulmonary/Chest: Effort normal. No respiratory distress.  Breathing comfortably at rest  Musculoskeletal: She exhibits no edema.  Moderate swelling and tenderness to right index finger, tenderness to palpation over area between DIP and PIP.  Erythema does not extend onto metacarpals, radial pulse 2+.  Strength 5/5 and equal bilaterally at shoulders, elbow and wrist.  Has full range of motion of fingers.  Neurological: She is alert.  Skin: Skin is warm and dry.  Psychiatric: She has a normal mood and affect.  Nursing note and vitals reviewed.    UC Treatments / Results  Labs (all labs ordered are listed, but only abnormal results are displayed) Labs Reviewed - No data to display  EKG None  Radiology No results found.  Procedures Procedures (including critical care time)  Medications  Ordered in UC Medications - No data to display  Initial Impression / Assessment and Plan / UC Course  I have reviewed the triage vital signs and the nursing notes.  Pertinent labs & imaging results that were available during my care of the patient were reviewed by me and considered in my medical decision making (see chart for details).  Patient was staying from wasp with localized erythema, swelling and tenderness.  Expect this to gradually resolve on its own.  Will recommend cold compresses, elevation, anti-inflammatories may also try antihistamines. May fill prednisone 50 mg x 3 days if symptoms persisting this morning. Discussed strict return precautions. Patient verbalized understanding and is agreeable with plan.  Final Clinical Impressions(s) / UC Diagnoses   Final diagnoses:  Bee sting, accidental or unintentional, initial encounter     Discharge Instructions     Please continue to apply ice or cold compresses to finger  I expect symptoms to resolve after a few hours, but swelling may persist for a day or 2.  If symptoms still persisting in the morning, you may fill prednisone and take for 3 days.  Please take Tylenol and ibuprofen to help with the pain.  You may also try Benadryl or Zyrtec  Please return if symptoms not improving as expected over the next 2 days.  Return sooner if symptoms worsening, develop numbness, worsening redness.   ED Prescriptions    Medication Sig Dispense Auth. Provider   predniSONE (DELTASONE) 50 MG tablet Take 1 tablet (50 mg total) by mouth daily for 3 days. 3 tablet Gedalya Jim C, PA-C     Controlled Substance Prescriptions [place] Controlled Substance Registry consulted? Not Applicable   Janith Lima, PA-C 09/10/17 1959    Janith Lima, [place] 09/10/17 1959

## 2017-09-10 NOTE — ED Triage Notes (Signed)
Stung by a wasp at 6:00pm tonight.  Stung on right index finger.  Finger is swollen and red

## 2017-09-15 ENCOUNTER — Encounter: Payer: Self-pay | Admitting: Medical

## 2017-09-15 ENCOUNTER — Ambulatory Visit (INDEPENDENT_AMBULATORY_CARE_PROVIDER_SITE_OTHER): Payer: BLUE CROSS/BLUE SHIELD | Admitting: Medical

## 2017-09-15 VITALS — BP 120/68 | HR 75 | Temp 98.4°F | Ht 66.5 in | Wt 207.2 lb

## 2017-09-15 DIAGNOSIS — J029 Acute pharyngitis, unspecified: Secondary | ICD-10-CM | POA: Diagnosis not present

## 2017-09-15 DIAGNOSIS — R059 Cough, unspecified: Secondary | ICD-10-CM

## 2017-09-15 DIAGNOSIS — R05 Cough: Secondary | ICD-10-CM

## 2017-09-15 DIAGNOSIS — J988 Other specified respiratory disorders: Secondary | ICD-10-CM

## 2017-09-15 MED ORDER — AZITHROMYCIN 250 MG PO TABS: ORAL_TABLET | ORAL | 0 refills | Status: DC

## 2017-09-15 MED ORDER — HYDROCODONE-HOMATROPINE 5-1.5 MG/5ML PO SYRP: 5.0000 mL | ORAL_SOLUTION | Freq: Three times a day (TID) | ORAL | 0 refills | Status: AC | PRN

## 2017-09-15 NOTE — Progress Notes (Signed)
Subjective: Chief Complaint  Patient presents with  . URI    cough, chest burning, throat pain,fatigue,bodyache x4 days    Here for cough, illness x 4 days.   She reports cough, fatigue, body aches, throat pain, felt feverish.  No NVD.   Feels some wheezing.   Has some headache yesterday.  No ear pain or sinus pressure.   Has some pain in neck and throat.  Using Coricidin HBP.  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  No other aggravating or relieving factors. No other complaint.  Past Medical History:  Diagnosis Date  . Anemia   . Encounter for routine gynecological examination    Dr. Charlesetta Garibaldi  . History of blood transfusion 2009   d/t Hg ~4  . Hx of adenomatous colonic polyps 04/21/2017  . Hyperlipidemia 2014  . SVD (spontaneous vaginal delivery)    x 2  . Ulcer as a child   gastric ulcer, unable to take aspirin  . Uterine fibroid    Current Outpatient Medications on File Prior to Visit  Medication Sig Dispense Refill  . acetaminophen (TYLENOL) 500 MG tablet Take 1,000 mg as needed by mouth for mild pain, moderate pain, fever or headache.    . simvastatin (ZOCOR) 40 MG tablet TAKE 1 TABLET AT BEDTIME 90 tablet 1   No current facility-administered medications on file prior to visit.    ROS as in subjective   Objective: BP 120/68   Pulse 75   Temp 98.4 F (36.9 C) (Oral)   Ht 5' 6.5" (1.689 m)   Wt 207 lb 3.2 oz (94 kg)   LMP 10/08/2011   SpO2 96%   BMI 32.94 kg/m    General appearance: Alert, WD/WN, no distress, somewhat ill appearing                             Skin: warm, no rash, no diaphoresis                           Head: no sinus tenderness                            Eyes: conjunctiva normal, corneas clear, PERRLA                            Ears: flat TMs, external ear canals normal                          Nose: septum midline, turbinates swollen, with erythema and clear discharge             Mouth/throat: MMM, tongue normal, mild pharyngeal  erythema                           Neck: supple, no adenopathy, no thyromegaly, non tender                          Heart: RRR, normal S1, S2, no murmurs                         Lungs: clear, no rhonchi, no wheezes, no rales  Extremities: no edema, non tender        Assessment: Encounter Diagnoses  Name Primary?  . Cough Yes  . Respiratory tract infection   . Sore throat      Plan: Advised rest, hdyration, begin zpak, can use Hycodan at night, can c/t coricidin HBP in the day.  If not much improved in 3-4 days, call back.  Discussed supportive care, prevention, hygiene.  Seleta was seen today for uri.  Diagnoses and all orders for this visit:  Cough  Respiratory tract infection  Sore throat  Other orders -     azithromycin (ZITHROMAX) 250 MG tablet; 2 tablets day 1, then 1 tablet days 2-4 -     HYDROcodone-homatropine (HYCODAN) 5-1.5 MG/5ML syrup; Take 5 mLs by mouth every 8 (eight) hours as needed for up to 5 days for cough.

## 2017-10-17 ENCOUNTER — Encounter (HOSPITAL_COMMUNITY): Payer: Self-pay

## 2017-10-17 ENCOUNTER — Ambulatory Visit (HOSPITAL_COMMUNITY)
Admission: EM | Admit: 2017-10-17 | Discharge: 2017-10-17 | Disposition: A | Discharge status: Home / Self Care | Payer: BLUE CROSS/BLUE SHIELD | Attending: Internal Medicine | Admitting: Internal Medicine

## 2017-10-17 DIAGNOSIS — R591 Generalized enlarged lymph nodes: Secondary | ICD-10-CM

## 2017-10-17 MED ORDER — HYDROCODONE-ACETAMINOPHEN 5-325 MG PO TABS: 1.0000 | ORAL_TABLET | Freq: Four times a day (QID) | ORAL | 0 refills | Status: DC | PRN

## 2017-10-17 MED ORDER — DICLOFENAC SODIUM 1 % TD GEL: 2.0000 g | Freq: Four times a day (QID) | TRANSDERMAL | 0 refills | Status: DC

## 2017-10-17 NOTE — Discharge Instructions (Signed)
You can try voltaren gel on affected area. Norco for break through pain. Warm compress. Follow up with PCP for reevaluation if symptoms not improving, does not resolve, worsens. Follow up with PCP for further evaluation if noticing more lymph nodes swollen.

## 2017-10-17 NOTE — ED Provider Notes (Signed)
[place]    CSN: [id number] Arrival date & time: 10/17/17  1158     History   Chief Complaint No chief complaint on file.   HPI [name] is a 52 y.o. female.   52 year old female comes in for evaluation of painful knots to her neck.  States started feeling some pain, and felt the knots last night.  Denies worsening with movement.  Denies increased swelling, erythema, increased warmth.  Denies fever, chills, night sweats.  Had URI symptoms that resolved last month.  Denies weight loss.  States took Tylenol without obvious relief. States unable to take NSAIDs as it causes pain of her stomach.      Past Medical History:  Diagnosis Date  . Anemia   . Encounter for routine gynecological examination    Dr. Charlesetta Garibaldi  . History of blood transfusion 2009   d/t Hg ~4  . Hx of adenomatous colonic polyps 04/21/2017  . Hyperlipidemia 2014  . SVD (spontaneous vaginal delivery)    x 2  . Ulcer as a child   gastric ulcer, unable to take aspirin  . Uterine fibroid     Patient Active Problem List   Diagnosis Date Noted  . Hx of adenomatous colonic polyps 04/21/2017  . Prediabetes 02/18/2017  . Routine general medical examination at a health care facility 07/09/2015  . Smoker 07/09/2015  . Hyperlipidemia 07/09/2015  . 23-polyvalent pneumococcal polysaccharide vaccine declined 07/09/2015  . Influenza vaccination declined 07/09/2015  . Screen for STD (sexually transmitted disease) 07/09/2015  . Special screening for malignant neoplasms, colon 07/09/2015  . Lateral epicondylitis of elbow 07/09/2015  . Abdominal pain, epigastric 07/09/2015    Past Surgical History:  Procedure Laterality Date  . BREAST BIOPSY Left   . DILATATION & CURRETTAGE/HYSTEROSCOPY WITH RESECTOCOPE N/A 10/31/2014   Procedure: DILATATION & CURETTAGE/HYSTEROSCOPY WITH RESECTOCOPE, RESECTION OF POLYP WITH MYOSURE ;  Surgeon: Crawford Givens, MD;  Location: [facility];  Service: Gynecology;   Laterality: N/A;  . ECTOPIC PREGNANCY SURGERY    . MYOMECTOMY    . TONSILLECTOMY  as a child  . TUBAL LIGATION    . WISDOM TOOTH EXTRACTION Bilateral     OB History   None      Home Medications    Prior to Admission medications   Medication Sig Start Date End Date Taking? Authorizing Provider  acetaminophen (TYLENOL) 500 MG tablet Take 1,000 mg as needed by mouth for mild pain, moderate pain, fever or headache.   Yes [provider]  azithromycin (ZITHROMAX) 250 MG tablet 2 tablets day 1, then 1 tablet days 2-4 09/15/17   Tysinger, Camelia Eng, PA-C  diclofenac sodium (VOLTAREN) 1 % GEL Apply 2 g topically 4 (four) times daily. 10/17/17   Tasia Catchings, Jaivon Vanbeek V, PA-C  HYDROcodone-acetaminophen (NORCO/VICODIN) 5-325 MG tablet Take 1 tablet by mouth every 6 (six) hours as needed for severe pain. 10/17/17   Ok Edwards, PA-C  simvastatin (ZOCOR) 40 MG tablet TAKE 1 TABLET AT BEDTIME 05/04/17   Girtha Rm, NP-C    Family History Family History  Problem Relation Age of Onset  . Hypertension Mother   . Hepatitis Mother        Hepatitis C  . Asthma Sister   . Stroke Maternal Aunt 50       2  . Diabetes Maternal Grandmother        leg amputation  . Heart disease Maternal Grandmother   . Hypertension Maternal Grandmother   . Cancer Paternal  Aunt        breast  . Breast cancer Paternal Aunt   . Colon cancer Neg Hx   . Rectal cancer Neg Hx   . Stomach cancer Neg Hx   . Colon polyps Neg Hx     Social History Social History   Tobacco Use  . Smoking status: Current Every Day Smoker    Packs/day: 0.25    Years: 36.00    Pack years: 9.00    Types: Cigarettes  . Smokeless tobacco: Never Used  Substance Use Topics  . Alcohol use: Yes    Comment: 2 drinks /week  . Drug use: No     Allergies   Aspirin   Review of Systems Review of Systems  Reason unable to perform ROS: See HPI as above.     Physical Exam Triage Vital Signs ED Triage Vitals  Enc Vitals Group     BP  10/17/17 1224 115/80     Pulse Rate 10/17/17 1224 70     Resp 10/17/17 1224 18     Temp 10/17/17 1224 98.1 F (36.7 C)     Temp src --      SpO2 10/17/17 1224 96 %     Weight --      Height --      Head Circumference --      Peak Flow --      Pain Score 10/17/17 1223 7     Pain Loc --      Pain Edu? --      Excl. in [place]? --    No data found.  Updated Vital Signs BP 115/80   Pulse 70   Temp 98.1 F (36.7 C)   Resp 18   LMP 10/08/2011   SpO2 96%   Physical Exam  Constitutional: She is oriented to person, place, and time. She appears well-developed and well-nourished. No distress.  HENT:  Head: Normocephalic and atraumatic.  Right Ear: Tympanic membrane, external ear and ear canal normal. Tympanic membrane is not erythematous and not bulging.  Left Ear: Tympanic membrane, external ear and ear canal normal. Tympanic membrane is not erythematous and not bulging.  Nose: Nose normal. Right sinus exhibits no maxillary sinus tenderness and no frontal sinus tenderness. Left sinus exhibits no maxillary sinus tenderness and no frontal sinus tenderness.  Mouth/Throat: Uvula is midline, oropharynx is clear and moist and mucous membranes are normal.  Eyes: Pupils are equal, round, and reactive to light. Conjunctivae are normal.  Neck: Normal range of motion. Neck supple.  Cardiovascular: Normal rate, regular rhythm and normal heart sounds. Exam reveals no gallop and no friction rub.  No murmur heard. Pulmonary/Chest: Effort normal and breath sounds normal. She has no decreased breath sounds. She has no wheezes. She has no rhonchi. She has no rales.  Lymphadenopathy:    She has cervical adenopathy.       Right cervical: Superficial cervical adenopathy present.  Small lesion felt at left occipital, ?occipital adenopathy vs ingrown hair.  No erythema, increased warmth.   Neurological: She is alert and oriented to person, place, and time.  Skin: Skin is warm and dry.  Psychiatric: She has  a normal mood and affect. Her behavior is normal. Judgment normal.     UC Treatments / Results  Labs (all labs ordered are listed, but only abnormal results are displayed) Labs Reviewed - No data to display  EKG None  Radiology No results found.  Procedures Procedures (including critical care time)  Medications Ordered in UC Medications - No data to display  Initial Impression / Assessment and Plan / UC Course  I have reviewed the triage vital signs and the nursing notes.  Pertinent labs & imaging results that were available during my care of the patient were reviewed by me and considered in my medical decision making (see chart for details).    Discussed causes of lymphadenopathy. Will have patient monitor closely for now. voltaren gel. norco for breakthrough pain. Patient to follow up with PCP for further evaluation and monitoring needed. Return precautions given. Patient expresses understanding and agrees to plan.  Final Clinical Impressions(s) / UC Diagnoses   Final diagnoses:  Lymphadenopathy    ED Prescriptions    Medication Sig Dispense Auth. Provider   HYDROcodone-acetaminophen (NORCO/VICODIN) 5-325 MG tablet Take 1 tablet by mouth every 6 (six) hours as needed for severe pain. 10 tablet Tasia Catchings, Osten Janek V, PA-C   diclofenac sodium (VOLTAREN) 1 % GEL Apply 2 g topically 4 (four) times daily. 1 Tube Ok Edwards, PA-C     Controlled Substance Prescriptions [place] Controlled Substance Registry consulted? Yes, I have consulted the [place] Controlled Substances Registry for this patient, and feel the risk/benefit ratio today is favorable for proceeding with this prescription for a controlled substance.   Ok Edwards, PA-C 10/17/17 1300

## 2017-10-17 NOTE — ED Triage Notes (Signed)
Pt presents with complaints of knot on neck since last night.

## 2017-10-19 ENCOUNTER — Ambulatory Visit (INDEPENDENT_AMBULATORY_CARE_PROVIDER_SITE_OTHER): Payer: BLUE CROSS/BLUE SHIELD | Admitting: Family Medicine

## 2017-10-19 ENCOUNTER — Encounter: Payer: Self-pay | Admitting: Family Medicine

## 2017-10-19 VITALS — BP 120/60 | HR 60 | Temp 98.0°F | Resp 16 | Ht 66.0 in | Wt 205.6 lb

## 2017-10-19 DIAGNOSIS — L089 Local infection of the skin and subcutaneous tissue, unspecified: Secondary | ICD-10-CM | POA: Diagnosis not present

## 2017-10-19 DIAGNOSIS — R590 Localized enlarged lymph nodes: Secondary | ICD-10-CM | POA: Diagnosis not present

## 2017-10-19 DIAGNOSIS — L723 Sebaceous cyst: Secondary | ICD-10-CM

## 2017-10-19 MED ORDER — DOXYCYCLINE HYCLATE 100 MG PO TABS: 100.0000 mg | ORAL_TABLET | Freq: Two times a day (BID) | ORAL | 0 refills | Status: DC

## 2017-10-19 NOTE — Patient Instructions (Signed)
Take the antibiotic twice daily for 10 days. Use warm compresses frequently to the area on the lower left scalp. I believe you have an infected sebaceous cyst, that is causing pain, and causing swelling of the lymph nodes on the back of your neck. The pain in the lymph nodes should improve as the infection is being treated, but it may take a few weeks for the lumps to completely go away. The cyst on the scalp may end up draining, or may end up need to be lanced. Return if you have increasing pain and swelling on the scalp. Otherwise, plan to return in 2 weeks for recheck of the lymph nodes.  You may continue to use the voltaren gel. I recommend using extra strength tylenol as needed for pain, and using the stronger narcotic pill only for severe pain (waiting at least 4 hours from your last tylenol dose; they both contain tylenol).  Try and use the pain pill very sparingly.     Epidermal Cyst An epidermal cyst is sometimes called an epidermal inclusion cyst or an infundibular cyst. It is a sac made of skin tissue. The sac contains a substance called keratin. Keratin is a protein that is normally secreted through the hair follicles. When keratin becomes trapped in the top layer of skin (epidermis), it can form an epidermal cyst. Epidermal cysts are usually found on the face, neck, trunk, and genitals. These cysts are usually harmless (benign), and they may not cause symptoms unless they become infected. It is important not to pop epidermal cysts yourself. What are the causes? This condition may be caused by:  A blocked hair follicle.  A hair that curls and re-enters the skin instead of growing straight out of the skin (ingrown hair).  A blocked pore.  Irritated skin.  An injury to the skin.  Certain conditions that are passed along from parent to child (inherited).  Human papillomavirus (HPV).  What increases the risk? The following factors may make you more likely to develop an  epidermal cyst:  Having acne.  Being overweight.  Wearing tight clothing.  What are the signs or symptoms? The only symptom of this condition may be a small, painless lump underneath the skin. When an epidermal cyst becomes infected, symptoms may include:  Redness.  Inflammation.  Tenderness.  Warmth.  Fever.  Keratin draining from the cyst. Keratin may look like a grayish-white, bad-smelling substance.  Pus draining from the cyst.  How is this diagnosed? This condition is diagnosed with a physical exam. In some cases, you may have a sample of tissue (biopsy) taken from your cyst to be examined under a microscope or tested for bacteria. You may be referred to a health care provider who specializes in skin care (dermatologist). How is this treated? In many cases, epidermal cysts go away on their own without treatment. If a cyst becomes infected, treatment may include:  Opening and draining the cyst. After draining, minor surgery to remove the rest of the cyst may be done.  Antibiotic medicine to help prevent infection.  Injections of medicines (steroids) that help to reduce inflammation.  Surgery to remove the cyst. Surgery may be done if: ? The cyst becomes large. ? The cyst bothers you. ? There is a chance that the cyst could turn into cancer.  Follow these instructions at home:  Take over-the-counter and prescription medicines only as told by your health care provider.  If you were prescribed an antibiotic, use it as told by your  health care provider. Do not stop using the antibiotic even if you start to feel better.  Keep the area around your cyst clean and dry.  Wear loose, dry clothing.  Do not try to pop your cyst.  Avoid touching your cyst.  Check your cyst every day for signs of infection.  Keep all follow-up visits as told by your health care provider. This is important. How is this prevented?  Wear clean, dry, clothing.  Avoid wearing tight  clothing.  Keep your skin clean and dry. Shower or take baths every day.  Wash your body with a benzoyl peroxide wash when you shower or bathe. Contact a health care provider if:  Your cyst develops symptoms of infection.  Your condition is not improving or is getting worse.  You develop a cyst that looks different from other cysts you have had.  You have a fever. Get help right away if:  Redness spreads from the cyst into the surrounding area. This information is not intended to replace advice given to you by your health care provider. Make sure you discuss any questions you have with your health care provider. Document Released: 03/29/2004 Document Revised: 12/26/2015 Document Reviewed: 02/28/2015 Elsevier Interactive Patient Education  [employer].

## 2017-10-19 NOTE — Progress Notes (Signed)
Chief Complaint  Patient presents with  . lumps on neck and head    hospital follow-up on lumps on neck and head.    She went to [facility] on 6/8 with swollen, tender area on the back of her head on the left side, and swollen lymph nodes bilaterally; symptoms were first noted 6/7.  She was prescribed voltaren gel (due to h/o ulcers and GI symptoms from ibuprofen), which has not been effective in helping her pain. She was also prescribed Norco--helpful, needing to take them every 6 hours.  Hurts to lay her head down, due to pain at her left occiput.  2 days ago, she also started having more pain on her right shoulder (spreading anteriorly from the swollen node at the neck).   She denies fever, chills, nausea, vomiting. Denies any other swollen glands. No cat scratches, body aches, rashes.  She became tearful, as she recently lost a friend to leukemia, and is very concerned about the swollen glands.  PMH, PSH, SH reviewed  Outpatient Encounter Medications as of 10/19/2017  Medication Sig  . acetaminophen (TYLENOL) 500 MG tablet Take 1,000 mg as needed by mouth for mild pain, moderate pain, fever or headache.  . diclofenac sodium (VOLTAREN) 1 % GEL Apply 2 g topically 4 (four) times daily.  Marland Kitchen HYDROcodone-acetaminophen (NORCO/VICODIN) 5-325 MG tablet Take 1 tablet by mouth every 6 (six) hours as needed for severe pain.  . simvastatin (ZOCOR) 40 MG tablet TAKE 1 TABLET AT BEDTIME  . doxycycline (VIBRA-TABS) 100 MG tablet Take 1 tablet (100 mg total) by mouth 2 (two) times daily.  . [DISCONTINUED] azithromycin (ZITHROMAX) 250 MG tablet 2 tablets day 1, then 1 tablet days 2-4   No facility-administered encounter medications on file as of 10/19/2017.    (doxy rx'd today, not taking prior to visit)  Allergies  Allergen Reactions  . Aspirin Nausea Only and Other (See Comments)    Stomach cramping   ROS:  See HPI. No URI symptoms (had recent cold, resolved), cough, shortness of breath, chest pain,  rash, GI complaints, bleeding, bruising  PHYSICAL EXAM:  BP 120/60   Pulse 60   Temp 98 F (36.7 C) (Oral)   Resp 16   Ht 5\' 6"  (1.676 m)   Wt 205 lb 9.6 oz (93.3 kg)   LMP 10/08/2011   SpO2 96%   BMI 33.18 kg/m   Well appearing, pleasant female, in no distress. Tearful at one point as described above, but full range of affect, normal mood HEENT: PERRL, EOMI, conjunctiva and sclera are clear Mild edema in nares, white mucus noted on the right. Sinuses nontender. OP is clear Left posterior/inferior scalp (at occiput)--very tender swollen area at the base of one braid.  No fluctuance or drainage. Neck: Tender LN posterior cervical chain, one on each side. No other LAD noted. Mildly tender at right trapezius muscle.  Heart: regular rate and rhythm Lungs: clear bilaterally Extremities: no edema Neuro: alert and oriented, cranial nerves intact, normal gait Skin: normal turgor, no rash   ASSESSMENT/PLAN:  Infected sebaceous cyst of skin - treat with warm compresses, doxy; to return if increasing in swelling/pain for I&D - Plan: doxycycline (VIBRA-TABS) 100 MG tablet  Posterior cervical lymphadenopathy - reassured is likely related to acute infection, rather than cancer. DDx reviewed.  return in 2 weeks for recheck. If persistent LAD, may need add'l eval   Doxy--risks/side effects reviewed. Declines changing to different oral NSAID with PPI vs H2 blocker to protect stomach, prefers  to continue with topical voltaren gel. Discussed risks/side effects of narcotics, to use sparingly. Discussed tylenol and that is in both meds, not to use together.  All questions answered.   Take the antibiotic twice daily for 10 days. Use warm compresses frequently to the area on the lower left scalp. I believe you have an infected sebaceous cyst, that is causing pain, and causing swelling of the lymph nodes on the back of your neck. The pain in the lymph nodes should improve as the infection is  being treated, but it may take a few weeks for the lumps to completely go away. The cyst on the scalp may end up draining, or may end up need to be lanced. Return if you have increasing pain and swelling on the scalp. Otherwise, plan to return in 2 weeks for recheck of the lymph nodes.  You may continue to use the voltaren gel. I recommend using extra strength tylenol as needed for pain, and using the stronger narcotic pill only for severe pain (waiting at least 4 hours from your last tylenol dose; they both contain tylenol).  Try and use the pain pill very sparingly.

## 2017-10-29 DIAGNOSIS — M722 Plantar fascial fibromatosis: Secondary | ICD-10-CM | POA: Diagnosis not present

## 2017-10-29 DIAGNOSIS — M76829 Posterior tibial tendinitis, unspecified leg: Secondary | ICD-10-CM | POA: Diagnosis not present

## 2017-10-29 DIAGNOSIS — M7732 Calcaneal spur, left foot: Secondary | ICD-10-CM | POA: Diagnosis not present

## 2017-10-29 DIAGNOSIS — M7731 Calcaneal spur, right foot: Secondary | ICD-10-CM | POA: Diagnosis not present

## 2017-10-30 ENCOUNTER — Ambulatory Visit (INDEPENDENT_AMBULATORY_CARE_PROVIDER_SITE_OTHER): Payer: BLUE CROSS/BLUE SHIELD | Admitting: Family Medicine

## 2017-10-30 ENCOUNTER — Encounter: Payer: Self-pay | Admitting: Family Medicine

## 2017-10-30 VITALS — BP 110/70 | HR 70 | Temp 98.4°F | Resp 16 | Ht 68.0 in | Wt 207.8 lb

## 2017-10-30 DIAGNOSIS — E782 Mixed hyperlipidemia: Secondary | ICD-10-CM | POA: Diagnosis not present

## 2017-10-30 DIAGNOSIS — R591 Generalized enlarged lymph nodes: Secondary | ICD-10-CM

## 2017-10-30 DIAGNOSIS — F17200 Nicotine dependence, unspecified, uncomplicated: Secondary | ICD-10-CM | POA: Diagnosis not present

## 2017-10-30 DIAGNOSIS — R7303 Prediabetes: Secondary | ICD-10-CM

## 2017-10-30 DIAGNOSIS — Z79899 Other long term (current) drug therapy: Secondary | ICD-10-CM

## 2017-10-30 LAB — CBC WITH DIFFERENTIAL/PLATELET
Basophils Absolute: 0 10*3/uL (ref 0.0–0.2)
Basos: 0 %
EOS (ABSOLUTE): 0 10*3/uL (ref 0.0–0.4)
Eos: 0 %
HEMATOCRIT: 39.7 % (ref 34.0–46.6)
Hemoglobin: 12.8 g/dL (ref 11.1–15.9)
IMMATURE GRANULOCYTES: 0 %
Immature Grans (Abs): 0 10*3/uL (ref 0.0–0.1)
LYMPHS: 31 %
Lymphocytes Absolute: 3.7 10*3/uL — ABNORMAL HIGH (ref 0.7–3.1)
MCH: 27.9 pg (ref 26.6–33.0)
MCHC: 32.2 g/dL (ref 31.5–35.7)
MCV: 87 fL (ref 79–97)
MONOS ABS: 0.7 10*3/uL (ref 0.1–0.9)
Monocytes: 6 %
NEUTROS PCT: 63 %
Neutrophils Absolute: 7.4 10*3/uL — ABNORMAL HIGH (ref 1.4–7.0)
PLATELETS: 275 10*3/uL (ref 150–450)
RBC: 4.58 x10E6/uL (ref 3.77–5.28)
RDW: 12.8 % (ref 12.3–15.4)
WBC: 11.9 10*3/uL — AB (ref 3.4–10.8)

## 2017-10-30 LAB — HEMOGLOBIN A1C
ESTIMATED AVERAGE GLUCOSE: 123 mg/dL
Hgb A1c MFr Bld: 5.9 % — ABNORMAL HIGH (ref 4.8–5.6)

## 2017-10-30 LAB — LIPID PANEL
CHOLESTEROL TOTAL: 193 mg/dL (ref 100–199)
Chol/HDL Ratio: 3.7 ratio (ref 0.0–4.4)
HDL: 52 mg/dL (ref >39)
LDL Calculated: 128 mg/dL — ABNORMAL HIGH (ref 0–99)
Triglycerides: 65 mg/dL (ref 0–149)
VLDL CHOLESTEROL CAL: 13 mg/dL (ref 5–40)

## 2017-10-30 NOTE — Patient Instructions (Signed)
Keep an eye on your enlarged lymph node. It is encouraging that it is getting smaller.  We will call you with your lab results. Follow up if you have any new or worsening symptoms.

## 2017-10-30 NOTE — Progress Notes (Signed)
   Subjective:    Patient ID: [name], female    DOB: [birth year], 52 y.o.   MRN: [id number]  HPI Chief Complaint  Patient presents with  . follow up    six month follow up meds and lymp nodes, lipid fasting    She is here for a follow up on mixed hyperlipidemia, prediabetes and lymphadenopathy.  She is on a statin. We increased her dose at her previous visit.  Compliance is excellent and no side effects.  States her diet in heavy with meat and she does eat fatty foods.  She is not exercising. Smoking still but only a few each day.   Hgb A1c 5.8% in July 2018.   Completed Doxy yesterday for infected sebaceous cyst on her scalp. States the area is back to normal and no longer tender.  She also had an enlarged right posterior cervical lymph node. States this is now smaller but not resolved completely.   States she is worried that she has cancer and would like to be tested. States a co-worker was recently diagnosed with leukemia and she is worried since she has swollen lymph node. Initially she had multiple lymph nodes enlarged but only one now. Would like to have blood work today.  She is fasting.  Up to date with colonoscopy, pap smear. Is due to have a mammogram.   Last CPE and labs: 02/2017   Denies fever, chills, unexplained weight loss, dizziness, chest pain, palpitations, shortness of breath, abdominal pain, back pain, N/V/D, urinary symptoms, LE edema.   Reviewed allergies, medications, past medical, surgical, family, and social history.   Review of Systems Pertinent positives and negatives in the history of present illness.     Objective:   Physical Exam BP 110/70   Pulse 70   Temp 98.4 F (36.9 C)   Resp 16   Ht 5\' 8"  (1.727 m)   Wt 207 lb 12.8 oz (94.3 kg)   LMP 10/08/2011   SpO2 98%   BMI 31.60 kg/m   Alert and oriented and in no distress. Posterior scalp is normal appearing, no edema or tenderness.  Tympanic membranes and canals are normal.  Pharyngeal area is normal. Neck is supple, normal ROM, right  posterior cervical lymph node is enlarged, otherwise normal lymph nodes.  Cardiac exam shows a regular rhythm without murmurs or gallops. Lungs are clear to auscultation. PERRLA, CN intact. Skin is warm and dry, no pallor.       Assessment & Plan:  Mixed hyperlipidemia - Plan: Lipid panel  Prediabetes - Plan: Hemoglobin A1c  Smoker  Lymphadenopathy - Plan: CBC with Differential/Platelet  Medication management - Plan: Lipid panel  Will recheck lipid panel and Hgb A1c.  Adjust statin as needed.  Encouraged healthy diet and exercise.  Smoking cessation done.  Reassured her that her blood work from October 2018 was normal, reviewed this with her. Will check CBC today.  Posterior cervical node enlargement improving which is encouraging. She will continue to keep an eye on this.

## 2017-11-19 ENCOUNTER — Other Ambulatory Visit: Payer: Self-pay | Admitting: Obstetrics and Gynecology

## 2017-11-19 DIAGNOSIS — M722 Plantar fascial fibromatosis: Secondary | ICD-10-CM | POA: Diagnosis not present

## 2017-11-19 DIAGNOSIS — M71571 Other bursitis, not elsewhere classified, right ankle and foot: Secondary | ICD-10-CM | POA: Diagnosis not present

## 2017-11-19 DIAGNOSIS — M71572 Other bursitis, not elsewhere classified, left ankle and foot: Secondary | ICD-10-CM | POA: Diagnosis not present

## 2017-11-19 DIAGNOSIS — N950 Postmenopausal bleeding: Secondary | ICD-10-CM | POA: Diagnosis not present

## 2017-11-23 ENCOUNTER — Other Ambulatory Visit: Payer: Self-pay | Admitting: Family Medicine

## 2017-11-23 ENCOUNTER — Encounter: Payer: Self-pay | Admitting: Family Medicine

## 2017-11-23 ENCOUNTER — Ambulatory Visit (INDEPENDENT_AMBULATORY_CARE_PROVIDER_SITE_OTHER): Payer: BLUE CROSS/BLUE SHIELD | Admitting: Family Medicine

## 2017-11-23 VITALS — BP 120/68 | HR 64 | Temp 98.3°F | Wt 206.0 lb

## 2017-11-23 DIAGNOSIS — D473 Essential (hemorrhagic) thrombocythemia: Secondary | ICD-10-CM

## 2017-11-23 DIAGNOSIS — D75839 Thrombocytosis, unspecified: Secondary | ICD-10-CM

## 2017-11-23 DIAGNOSIS — R599 Enlarged lymph nodes, unspecified: Secondary | ICD-10-CM

## 2017-11-23 NOTE — Progress Notes (Signed)
   Subjective:    Patient ID: [name], female    DOB: 1965-10-27, 52 y.o.   MRN: [id number]  HPI Chief Complaint  Patient presents with  . other    follow up on lymph node swelling and elevated wbc   She is here to follow up on enlarged lymph node and elevated WBC count. States the enlarged posterior cervical node has improved and is smaller.  States she was ill the week prior to having her WBC count checked. She would like to repeat this.    Denies fever, chills, dizziness, URI symptoms, chest pain, palpitations, shortness of breath, cough, abdominal pain, N/V/D, urinary symptoms, LE edema.   Reviewed allergies, medications, past medical, surgical, family, and social history.   Review of Systems Pertinent positives and negatives in the history of present illness.     Objective:   Physical Exam BP 120/68 (BP Location: Left Arm, Patient Position: Sitting)   Pulse 64   Temp 98.3 F (36.8 C)   Wt 206 lb (93.4 kg)   LMP 10/08/2011   SpO2 97%   BMI 31.32 kg/m   Alert and in no distress. Tympanic membranes and canals are normal. Pharyngeal area is normal. Neck is supple with mildly enlarged nontender right posterior cervical node, smaller than on previous exam.       Assessment & Plan:  Lymph node enlargement - Plan: CBC with Differential/Platelet  Thrombocytosis (HCC) - Plan: CBC with Differential/Platelet  Posterior cervical node is almost back to normal. Will check CBC due to elevated WBC count at previous visit.

## 2017-11-24 LAB — CBC WITH DIFFERENTIAL/PLATELET
BASOS: 0 %
Basophils Absolute: 0 10*3/uL (ref 0.0–0.2)
EOS (ABSOLUTE): 0.1 10*3/uL (ref 0.0–0.4)
EOS: 1 %
HEMATOCRIT: 38.9 % (ref 34.0–46.6)
HEMOGLOBIN: 13 g/dL (ref 11.1–15.9)
IMMATURE GRANULOCYTES: 0 %
Immature Grans (Abs): 0 10*3/uL (ref 0.0–0.1)
Lymphocytes Absolute: 3.4 10*3/uL — ABNORMAL HIGH (ref 0.7–3.1)
Lymphs: 49 %
MCH: 28.8 pg (ref 26.6–33.0)
MCHC: 33.4 g/dL (ref 31.5–35.7)
MCV: 86 fL (ref 79–97)
MONOS ABS: 0.4 10*3/uL (ref 0.1–0.9)
Monocytes: 6 %
NEUTROS PCT: 44 %
Neutrophils Absolute: 3.1 10*3/uL (ref 1.4–7.0)
Platelets: 283 10*3/uL (ref 150–450)
RBC: 4.52 x10E6/uL (ref 3.77–5.28)
RDW: 13.6 % (ref 12.3–15.4)
WBC: 7 10*3/uL (ref 3.4–10.8)

## 2017-12-03 DIAGNOSIS — N950 Postmenopausal bleeding: Secondary | ICD-10-CM | POA: Diagnosis not present

## 2017-12-03 DIAGNOSIS — Z09 Encounter for follow-up examination after completed treatment for conditions other than malignant neoplasm: Secondary | ICD-10-CM | POA: Diagnosis not present

## 2018-02-02 ENCOUNTER — Other Ambulatory Visit: Payer: Self-pay | Admitting: Family Medicine

## 2018-02-02 DIAGNOSIS — Z1231 Encounter for screening mammogram for malignant neoplasm of breast: Secondary | ICD-10-CM

## 2018-02-04 ENCOUNTER — Ambulatory Visit
Admission: RE | Admit: 2018-02-04 | Discharge: 2018-02-04 | Disposition: A | Discharge status: Home / Self Care | Payer: BLUE CROSS/BLUE SHIELD | Source: Ambulatory Visit | Attending: Family Medicine | Admitting: Family Medicine

## 2018-02-04 ENCOUNTER — Ambulatory Visit: Payer: BLUE CROSS/BLUE SHIELD

## 2018-02-04 DIAGNOSIS — Z1231 Encounter for screening mammogram for malignant neoplasm of breast: Secondary | ICD-10-CM

## 2018-02-05 ENCOUNTER — Other Ambulatory Visit: Payer: Self-pay | Admitting: Family Medicine

## 2018-02-05 DIAGNOSIS — R928 Other abnormal and inconclusive findings on diagnostic imaging of breast: Secondary | ICD-10-CM

## 2018-02-10 ENCOUNTER — Ambulatory Visit
Admission: RE | Admit: 2018-02-10 | Discharge: 2018-02-10 | Disposition: A | Discharge status: Home / Self Care | Payer: BLUE CROSS/BLUE SHIELD | Source: Ambulatory Visit | Attending: Family Medicine | Admitting: Family Medicine

## 2018-02-10 DIAGNOSIS — N6489 Other specified disorders of breast: Secondary | ICD-10-CM | POA: Diagnosis not present

## 2018-02-10 DIAGNOSIS — R928 Other abnormal and inconclusive findings on diagnostic imaging of breast: Secondary | ICD-10-CM

## 2018-02-17 ENCOUNTER — Telehealth: Payer: Self-pay

## 2018-02-17 MED ORDER — SIMVASTATIN 40 MG PO TABS: 40.0000 mg | ORAL_TABLET | Freq: Every day | ORAL | 1 refills | Status: DC

## 2018-02-17 NOTE — Telephone Encounter (Signed)
Pt called because she never received her Simvastatin from mail order. She wanted to cancel the prescription with mail order and have it sent to her local pharmacy.

## 2018-04-12 ENCOUNTER — Encounter: Payer: Self-pay | Admitting: Family Medicine

## 2018-04-12 ENCOUNTER — Ambulatory Visit (INDEPENDENT_AMBULATORY_CARE_PROVIDER_SITE_OTHER): Payer: BLUE CROSS/BLUE SHIELD | Admitting: Family Medicine

## 2018-04-12 VITALS — BP 120/70 | HR 68 | Temp 98.3°F | Resp 16 | Wt 208.6 lb

## 2018-04-12 DIAGNOSIS — J029 Acute pharyngitis, unspecified: Secondary | ICD-10-CM | POA: Diagnosis not present

## 2018-04-12 LAB — POCT RAPID STREP A (OFFICE): Rapid Strep A Screen: NEGATIVE

## 2018-04-12 NOTE — Patient Instructions (Signed)
Your strep test is negative.   I suspect your symptoms are related to a viral illness and recommend treating your symptoms at this point.   drink extra water, use salt water gargles and chloraseptic for throat irritation and Tylenol or Ibuprofen for aches and pains. Mucinex DM for congestion and cough, Call if you are not improving by Wednesday or if you develop worsening symptoms.

## 2018-04-12 NOTE — Progress Notes (Signed)
Chief Complaint  Patient presents with  . sore throat    sore throat with headache since thursday. no other symptoms.    Subjective:  [name] is a 52 y.o. female who presents for a 5 day history of sore throat and frontal headache. Severe pain with swallowing. 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 No known strep cases.   Denies fever, chills, ear pain, nasal congestion, rhinorrhea, post nasal drainage, cough, chest pain, shortness of breath, abdominal pain, N/V/D.   Treatment to date: coricidin, nyquil , tylenol.  positive sick contacts.  No other aggravating or relieving factors.  No other c/o.  ROS as in subjective.   Objective: Vitals:   04/12/18 1108  BP: 120/70  Pulse: 68  Resp: 16  Temp: 98.3 F (36.8 C)  SpO2: 98%    General appearance: Alert, WD/WN, no distress, mildly ill appearing                             Skin: warm, no rash                           Head: no sinus tenderness                            Eyes: conjunctiva normal, corneas clear, PERRLA                            Ears: pearly TMs, external ear canals normal                          Nose: septum midline, turbinates swollen, with erythema and clear discharge             Mouth/throat: MMM, tongue normal, mild pharyngeal erythema, no edema or exudate                           Neck: supple, no adenopathy, no thyromegaly, nontender                          Heart: RRR, normal S1, S2, no murmurs                         Lungs: CTA bilaterally, no wheezes, rales, or rhonchi      Assessment: Acute pharyngitis, unspecified etiology - Plan: POCT rapid strep A    Plan: Rapid strep negative.  Discussed diagnosis and treatment of acute pharyngitis. Discussed that her symptoms appear to be related to a viral etiology as of now.   Suggested symptomatic OTC remedies including chloraseptic and salt water gargles.  Nasal saline spray for congestion.  Tylenol or Ibuprofen OTC for  fever and malaise.  Call/return in 2-3 days if symptoms aren't resolving.

## 2018-04-15 ENCOUNTER — Telehealth: Payer: Self-pay

## 2018-04-15 MED ORDER — AZITHROMYCIN 250 MG PO TABS: ORAL_TABLET | ORAL | 0 refills | Status: DC

## 2018-04-15 NOTE — Telephone Encounter (Signed)
Left detailed message for pt about med and if after 10 days not back to baseline to come back in to reevalute.

## 2018-04-15 NOTE — Telephone Encounter (Signed)
Pt says she is not feeling any better and would like something called in for her . Pt was in this past Monday with sore throat and now has a cough.  Please advise [facility]

## 2018-04-15 NOTE — Telephone Encounter (Signed)
Ok to send in a Z-pak for her. 2 tabs on day 1, then 1 tab on days 2-5. No refills. If she is not back to baseline after day 10 of starting the antibiotic let me know.

## 2018-08-17 ENCOUNTER — Other Ambulatory Visit: Payer: Self-pay | Admitting: Family Medicine

## 2018-09-01 ENCOUNTER — Encounter: Payer: Self-pay | Admitting: Family Medicine

## 2018-09-01 ENCOUNTER — Other Ambulatory Visit: Payer: Self-pay

## 2018-09-01 ENCOUNTER — Ambulatory Visit (INDEPENDENT_AMBULATORY_CARE_PROVIDER_SITE_OTHER): Payer: BLUE CROSS/BLUE SHIELD | Admitting: Family Medicine

## 2018-09-01 VITALS — Wt 202.0 lb

## 2018-09-01 DIAGNOSIS — G8929 Other chronic pain: Secondary | ICD-10-CM

## 2018-09-01 DIAGNOSIS — M545 Low back pain, unspecified: Secondary | ICD-10-CM | POA: Insufficient documentation

## 2018-09-01 DIAGNOSIS — R7303 Prediabetes: Secondary | ICD-10-CM

## 2018-09-01 DIAGNOSIS — E782 Mixed hyperlipidemia: Secondary | ICD-10-CM

## 2018-09-01 DIAGNOSIS — F17200 Nicotine dependence, unspecified, uncomplicated: Secondary | ICD-10-CM

## 2018-09-01 NOTE — Progress Notes (Signed)
Subjective:    Interactive audio and video telecommunications were attempted between this provider and patient, however due to patient's phone not working with the app, they did not have access to video capability.  We continued and completed visit with audio only.  The patient was located in her office at work. 2 patient identifiers used.  The provider was located in the office. The patient did consent to this visit and is aware of possible charges through their insurance for this visit.  The other persons participating in this telemedicine service were none.   Patient ID: [name], female    DOB: 1966-02-24, 53 y.o.   MRN: [id number]  HPI Chief Complaint  Patient presents with  . right side pain    right side pain, gets worse when laying down. been going on for a while. achy pain, pain level 7/10   Complains of a several month history of right side pain that occurs only when laying on her right side. No known injury.  States pain is not worsening or becoming more frequent.  The pain is nonradiating.  Denies any skin changes.  No numbness, tingling or weakness.  Denies fever, chills, unexplained weight loss, fatigue, chest pain, palpitations, shortness of breath, cough, abdominal pain, nausea, vomiting, diarrhea or constipation.  States she recently applied for new insurance and had blood work done.  She would like to review the blood work over the telephone and then fax the results to me.  She has a history of prediabetes -states per the lab results her Hgb A1c was 5.9 percent on August 18, 2018 States she eats a lot of potatoes and bread.  She also drinks fruit punch. States she plans to eat healthier and has started walking.  Walking a mile a day  History of hyperlipidemia and states they checked lipids.  HDL in the 50s and LDL was 92  Taking statin daily.   States she is smoking 3 cigarettes/day.  She does not smoke at work only when she gets home.  States she is  not buying another pack of cigarettes and plans to stop smoking when this one is finished.  Has OB/GYN and has been evaluated post menopausal bleeding. No longer having any issues.   Reviewed allergies, medications, past medical, surgical, family, and social history.    Review of Systems Pertinent positives and negatives in the history of present illness.     Objective:   Physical Exam Wt 202 lb (91.6 kg)   LMP 10/08/2011   BMI 30.71 kg/m   Alert and oriented in no acute distress.  Pleasant and normal speech, mood and thought process.  Denies any abnormal sensations or motor functions.  Denies any changes in urinary or bowel habits.  Reports normal gait     Assessment & Plan:  Chronic right-sided low back pain without sciatica  Smoker  Prediabetes  Mixed hyperlipidemia   Discussed that chronic right-sided low back pain that is only present when laying on her right side appears to be musculoskeletal issue and difficult to determine via virtual visit.  The issue has been ongoing for months without worsening and no neurological symptoms.  No red flags.  She will do supportive care and let me know if her symptoms are worsening.  Discussed potentially bringing her in the office for a visit if she is getting worse.  She will schedule a 74-month follow-up and we will hopefully be seeing patients in the office once again if the current pandemic and  the guidelines allow.  Reviewed lab results over the phone that she received from recent insurance visit.  Hemoglobin A1c was 5.9% on August 18, 2018 and she has a history of prediabetes.  Counseling on cutting back on carbohydrates and sweets and increasing physical activity.  She is already making healthy changes. Currently smoking 3 cigarettes/day and only does this at home.  She plans to stop smoking when this current pack is finished.  Congratulated her on taking the steps necessary.  Encouraged her to replace her current smoking habit with a  healthier habit. Reports doing well on her statin and taking it most days of the week. States she will fax over the new lab results and I will scan these into her chart. Follow-up in 3 months or sooner if needed.   Time spent on call was 25 minutes and in review of previous records 2 minutes total.  This virtual service is not related to other E/M service within previous 7 days.

## 2018-09-07 ENCOUNTER — Encounter: Payer: Self-pay | Admitting: Family Medicine

## 2018-11-11 DIAGNOSIS — R102 Pelvic and perineal pain: Secondary | ICD-10-CM | POA: Diagnosis not present

## 2018-11-11 DIAGNOSIS — Z01419 Encounter for gynecological examination (general) (routine) without abnormal findings: Secondary | ICD-10-CM | POA: Diagnosis not present

## 2018-11-11 DIAGNOSIS — Z6834 Body mass index (BMI) 34.0-34.9, adult: Secondary | ICD-10-CM | POA: Diagnosis not present

## 2018-11-21 ENCOUNTER — Other Ambulatory Visit: Payer: Self-pay | Admitting: Family Medicine

## 2018-11-22 NOTE — Telephone Encounter (Signed)
Pt has an appt next week

## 2018-11-29 DIAGNOSIS — H0288A Meibomian gland dysfunction right eye, upper and lower eyelids: Secondary | ICD-10-CM | POA: Diagnosis not present

## 2018-11-29 DIAGNOSIS — H04123 Dry eye syndrome of bilateral lacrimal glands: Secondary | ICD-10-CM | POA: Diagnosis not present

## 2018-11-29 DIAGNOSIS — H524 Presbyopia: Secondary | ICD-10-CM | POA: Diagnosis not present

## 2018-11-29 DIAGNOSIS — H52223 Regular astigmatism, bilateral: Secondary | ICD-10-CM | POA: Diagnosis not present

## 2018-11-29 DIAGNOSIS — H5213 Myopia, bilateral: Secondary | ICD-10-CM | POA: Diagnosis not present

## 2018-11-29 DIAGNOSIS — H0288B Meibomian gland dysfunction left eye, upper and lower eyelids: Secondary | ICD-10-CM | POA: Diagnosis not present

## 2018-11-29 DIAGNOSIS — H40013 Open angle with borderline findings, low risk, bilateral: Secondary | ICD-10-CM | POA: Diagnosis not present

## 2018-12-01 NOTE — Progress Notes (Signed)
   Subjective:    Patient ID: [name], female    DOB: Jun 20, 1965, 53 y.o.   MRN: [id number]  HPI Chief Complaint  Patient presents with  . follow-up    follow-up on back pain and prediabetes, was doing better and started back up yesterday   She is here for follow up on back pain and other chronic health conditions.  Right low back pain had resolved completely and yesterday she noticed a dull ache again in the right low back area. Pain is nonradiating. No injury. No numbness, tingling or weakness. No urinary symptoms. She has not tried anything for the pain. She has been sitting more over the past few weeks.  Prediabetes- Hgb A1c 5.9% in Amy 2020.   On statin for mixed HL. Taking it daily and no side effects.   Diet -has been cut back on soda and does not eat sweets. Eats a lot of fried chicken, potatoes.   Smoking - stopped on Amy 24 2020   Complains of bilateral foot pain upon awakening most mornings for the past several months. Would like to see a podiatrist. No numbness, tingling, burning.   Denies fever, chills, dizziness, chest pain, palpitations, shortness of breath, abdominal pain, N/V/D, urinary symptoms, LE edema.     Review of Systems Pertinent positives and negatives in the history of present illness.     Objective:   Physical Exam BP 120/70   Pulse 60   Temp 97.9 F (36.6 C) (Oral)   Wt 215 lb 12.8 oz (97.9 kg)   LMP 10/08/2011   BMI 32.81 kg/m   Alert and oriented and in no acute distress. Not otherwise examined. Normal foot exam.        Assessment & Plan:  Prediabetes - Plan: Lipid panel, Comprehensive metabolic panel, CBC with Differential/Platelet, TSH, T4, free, HgB A1c, A1c 6.2% and increased from 5.9%. in depth counseling on healthy diet and exercise to control BS.   Mixed hyperlipidemia - Plan: Lipid panel, Comprehensive metabolic panel, continue on statin and follow up pending labs.   Foot pain, bilateral - Plan: Ambulatory  referral to Podiatry  Chronic right-sided low back pain without sciatica - Plan: offered PT and she states it is not that bothersome. Will let me know if it worsens.   Hgb A1c 6.2%

## 2018-12-02 ENCOUNTER — Encounter: Payer: Self-pay | Admitting: Family Medicine

## 2018-12-02 ENCOUNTER — Ambulatory Visit (INDEPENDENT_AMBULATORY_CARE_PROVIDER_SITE_OTHER): Payer: BC Managed Care – PPO | Admitting: Family Medicine

## 2018-12-02 ENCOUNTER — Other Ambulatory Visit: Payer: Self-pay

## 2018-12-02 VITALS — BP 120/70 | HR 60 | Temp 97.9°F | Wt 215.8 lb

## 2018-12-02 DIAGNOSIS — M545 Low back pain: Secondary | ICD-10-CM

## 2018-12-02 DIAGNOSIS — E782 Mixed hyperlipidemia: Secondary | ICD-10-CM | POA: Diagnosis not present

## 2018-12-02 DIAGNOSIS — R7303 Prediabetes: Secondary | ICD-10-CM | POA: Diagnosis not present

## 2018-12-02 DIAGNOSIS — M79671 Pain in right foot: Secondary | ICD-10-CM

## 2018-12-02 DIAGNOSIS — G8929 Other chronic pain: Secondary | ICD-10-CM

## 2018-12-02 DIAGNOSIS — M79672 Pain in left foot: Secondary | ICD-10-CM

## 2018-12-02 HISTORY — DX: Pain in right foot: M79.671

## 2018-12-02 LAB — POCT GLYCOSYLATED HEMOGLOBIN (HGB A1C): Hemoglobin A1C: 6.2 % — AB (ref 4.0–5.6)

## 2018-12-02 NOTE — Patient Instructions (Signed)
Your hemoglobin A1c is 6.2% today. You are still in the prediabetes range but closer to having diabetes.   Cut back on sugar and carbohydrates (potatoes, bread, pasta, rice). Cut back on fried foods. Try the intermittent fasting we discussed. Eat your meals in an 8 hour period (for example from 9 am til 5 pm) and do not eat the remaining hours. Ok to drink fluids (no calories or sugar) the other hours.   Increase your physical activity.   Follow up in 6 months or sooner if needed.   You should hear from [place].      Acute Back Pain, Adult Acute back pain is sudden and usually short-lived. It is often caused by an injury to the muscles and tissues in the back. The injury may result from:  A muscle or ligament getting overstretched or torn (strained). Ligaments are tissues that connect bones to each other. Lifting something improperly can cause a back strain.  Wear and tear (degeneration) of the spinal disks. Spinal disks are circular tissue that provides cushioning between the bones of the spine (vertebrae).  Twisting motions, such as while playing sports or doing yard work.  A hit to the back.  Arthritis. You may have a physical exam, lab tests, and imaging tests to find the cause of your pain. Acute back pain usually goes away with rest and home care. Follow these instructions at home: Managing pain, stiffness, and swelling  Take over-the-counter and prescription medicines only as told by your health care provider.  Your health care provider may recommend applying ice during the first 24-48 hours after your pain starts. To do this: ? Put ice in a plastic bag. ? Place a towel between your skin and the bag. ? Leave the ice on for 20 minutes, 2-3 times a day.  If directed, apply heat to the affected area as often as told by your health care provider. Use the heat source that your health care provider recommends, such as a moist heat pack or a heating pad. ? Place a towel  between your skin and the heat source. ? Leave the heat on for 20-30 minutes. ? Remove the heat if your skin turns bright red. This is especially important if you are unable to feel pain, heat, or cold. You have a greater risk of getting burned. Activity   Do not stay in bed. Staying in bed for more than 1-2 days can delay your recovery.  Sit up and stand up straight. Avoid leaning forward when you sit, or hunching over when you stand. ? If you work at a desk, sit close to it so you do not need to lean over. Keep your chin tucked in. Keep your neck drawn back, and keep your elbows bent at a right angle. Your arms should look like the letter "L." ? Sit high and close to the steering wheel when you drive. Add lower back (lumbar) support to your car seat, if needed.  Take short walks on even surfaces as soon as you are able. Try to increase the length of time you walk each day.  Do not sit, drive, or stand in one place for more than 30 minutes at a time. Sitting or standing for long periods of time can put stress on your back.  Do not drive or use heavy machinery while taking prescription pain medicine.  Use proper lifting techniques. When you bend and lift, use positions that put less stress on your back: ? [place] your  knees. ? Keep the load close to your body. ? Avoid twisting.  Exercise regularly as told by your health care provider. Exercising helps your back heal faster and helps prevent back injuries by keeping muscles strong and flexible.  Work with a physical therapist to make a safe exercise program, as recommended by your health care provider. Do any exercises as told by your physical therapist. Lifestyle  Maintain a healthy weight. Extra weight puts stress on your back and makes it difficult to have good posture.  Avoid activities or situations that make you feel anxious or stressed. Stress and anxiety increase muscle tension and can make back pain worse. Learn ways to manage  anxiety and stress, such as through exercise. General instructions  Sleep on a firm mattress in a comfortable position. Try lying on your side with your knees slightly bent. If you lie on your back, put a pillow under your knees.  Follow your treatment plan as told by your health care provider. This may include: ? Cognitive or behavioral therapy. ? Acupuncture or massage therapy. ? Meditation or yoga. Contact a health care provider if:  You have pain that is not relieved with rest or medicine.  You have increasing pain going down into your legs or buttocks.  Your pain does not improve after 2 weeks.  You have pain at night.  You lose weight without trying.  You have a fever or chills. Get help right away if:  You develop new bowel or bladder control problems.  You have unusual weakness or numbness in your arms or legs.  You develop nausea or vomiting.  You develop abdominal pain.  You feel faint. Summary  Acute back pain is sudden and usually short-lived.  Use proper lifting techniques. When you bend and lift, use positions that put less stress on your back.  Take over-the-counter and prescription medicines and apply heat or ice as directed by your health care provider. This information is not intended to replace advice given to you by your health care provider. Make sure you discuss any questions you have with your health care provider. Document Released: 04/28/2005 Document Revised: 08/17/2018 Document Reviewed: 12/10/2016 Elsevier Patient Education  2020 [employer].

## 2018-12-03 LAB — COMPREHENSIVE METABOLIC PANEL
ALT: 26 IU/L (ref 0–32)
AST: 24 IU/L (ref 0–40)
Albumin/Globulin Ratio: 2 (ref 1.2–2.2)
Albumin: 4.5 g/dL (ref 3.8–4.9)
Alkaline Phosphatase: 72 IU/L (ref 39–117)
BUN/Creatinine Ratio: 17 (ref 9–23)
BUN: 11 mg/dL (ref 6–24)
Bilirubin Total: 0.4 mg/dL (ref 0.0–1.2)
CO2: 22 mmol/L (ref 20–29)
Calcium: 9.2 mg/dL (ref 8.7–10.2)
Chloride: 102 mmol/L (ref 96–106)
Creatinine, Ser: 0.63 mg/dL (ref 0.57–1.00)
GFR calc Af Amer: 118 mL/min/{1.73_m2} (ref >59)
GFR calc non Af Amer: 102 mL/min/{1.73_m2} (ref >59)
Globulin, Total: 2.3 g/dL (ref 1.5–4.5)
Glucose: 93 mg/dL (ref 65–99)
Potassium: 4.3 mmol/L (ref 3.5–5.2)
Sodium: 139 mmol/L (ref 134–144)
Total Protein: 6.8 g/dL (ref 6.0–8.5)

## 2018-12-03 LAB — CBC WITH DIFFERENTIAL/PLATELET
Basophils Absolute: 0 10*3/uL (ref 0.0–0.2)
Basos: 0 %
EOS (ABSOLUTE): 0.1 10*3/uL (ref 0.0–0.4)
Eos: 1 %
Hematocrit: 41 % (ref 34.0–46.6)
Hemoglobin: 13.3 g/dL (ref 11.1–15.9)
Immature Grans (Abs): 0 10*3/uL (ref 0.0–0.1)
Immature Granulocytes: 0 %
Lymphocytes Absolute: 2.4 10*3/uL (ref 0.7–3.1)
Lymphs: 48 %
MCH: 28.1 pg (ref 26.6–33.0)
MCHC: 32.4 g/dL (ref 31.5–35.7)
MCV: 87 fL (ref 79–97)
Monocytes Absolute: 0.4 10*3/uL (ref 0.1–0.9)
Monocytes: 8 %
Neutrophils Absolute: 2.3 10*3/uL (ref 1.4–7.0)
Neutrophils: 43 %
Platelets: 280 10*3/uL (ref 150–450)
RBC: 4.74 x10E6/uL (ref 3.77–5.28)
RDW: 12.4 % (ref 11.7–15.4)
WBC: 5.2 10*3/uL (ref 3.4–10.8)

## 2018-12-03 LAB — LIPID PANEL
Chol/HDL Ratio: 3.8 ratio (ref 0.0–4.4)
Cholesterol, Total: 163 mg/dL (ref 100–199)
HDL: 43 mg/dL (ref >39)
LDL Calculated: 106 mg/dL — ABNORMAL HIGH (ref 0–99)
Triglycerides: 71 mg/dL (ref 0–149)
VLDL Cholesterol Cal: 14 mg/dL (ref 5–40)

## 2018-12-03 LAB — TSH: TSH: 1.09 u[IU]/mL (ref 0.450–4.500)

## 2018-12-03 LAB — T4, FREE: Free T4: 1.19 ng/dL (ref 0.82–1.77)

## 2018-12-08 ENCOUNTER — Ambulatory Visit: Payer: BC Managed Care – PPO | Admitting: Podiatry

## 2018-12-08 DIAGNOSIS — R102 Pelvic and perineal pain: Secondary | ICD-10-CM | POA: Diagnosis not present

## 2018-12-10 ENCOUNTER — Ambulatory Visit (INDEPENDENT_AMBULATORY_CARE_PROVIDER_SITE_OTHER): Payer: BC Managed Care – PPO

## 2018-12-10 ENCOUNTER — Other Ambulatory Visit: Payer: Self-pay | Admitting: Podiatry

## 2018-12-10 ENCOUNTER — Ambulatory Visit (INDEPENDENT_AMBULATORY_CARE_PROVIDER_SITE_OTHER): Payer: BC Managed Care – PPO | Admitting: Podiatry

## 2018-12-10 ENCOUNTER — Encounter: Payer: Self-pay | Admitting: Podiatry

## 2018-12-10 ENCOUNTER — Other Ambulatory Visit: Payer: Self-pay

## 2018-12-10 VITALS — Temp 98.2°F

## 2018-12-10 DIAGNOSIS — M79672 Pain in left foot: Secondary | ICD-10-CM

## 2018-12-10 DIAGNOSIS — D259 Leiomyoma of uterus, unspecified: Secondary | ICD-10-CM | POA: Insufficient documentation

## 2018-12-10 DIAGNOSIS — M79671 Pain in right foot: Secondary | ICD-10-CM | POA: Diagnosis not present

## 2018-12-10 DIAGNOSIS — M722 Plantar fascial fibromatosis: Secondary | ICD-10-CM

## 2018-12-10 DIAGNOSIS — Z862 Personal history of diseases of the blood and blood-forming organs and certain disorders involving the immune mechanism: Secondary | ICD-10-CM | POA: Insufficient documentation

## 2018-12-10 DIAGNOSIS — R21 Rash and other nonspecific skin eruption: Secondary | ICD-10-CM | POA: Insufficient documentation

## 2018-12-10 DIAGNOSIS — B009 Herpesviral infection, unspecified: Secondary | ICD-10-CM | POA: Insufficient documentation

## 2018-12-10 HISTORY — DX: Rash and other nonspecific skin eruption: R21

## 2018-12-10 NOTE — Progress Notes (Signed)
   Subjective:    Patient ID: [name], female    DOB: Dec 28, 1965, 53 y.o.   MRN: [id number]  HPI    Review of Systems  All other systems reviewed and are negative.      Objective:   Physical Exam        Assessment & Plan:

## 2018-12-10 NOTE — Progress Notes (Signed)
Subjective:   Patient ID: [name], female   DOB: 53 y.o.   MRN: [id number]   HPI Patient presents stating she has had some pain in the bottom of both feet that she had some concerns about and states it hurts in the morning and had to have previous injection boot which only helped temporarily.  States the pain does not last during the day but she wanted to know what is going on and also has a small lesion on the right foot.  Patient does not smoke likes to be active   Review of Systems  All other systems reviewed and are negative.       Objective:  Physical Exam Vitals signs and nursing note reviewed.  Constitutional:      Appearance: She is well-developed.  Pulmonary:     Effort: Pulmonary effort is normal.  Musculoskeletal: Normal range of motion.  Skin:    General: Skin is warm.  Neurological:     Mental Status: She is alert.     Neurovascular status found to be intact muscle strength was adequate range of motion within normal limits.  Patient is found to have mild discomfort in the distal fascial band bilateral but localized with no proximal edema erythema drainage noted and a small keratotic lesion sub-metatarsal right     Assessment:  Plantar fascial inflammation bilateral localized without significant pain and lesion right     Plan:  H&P x-rays reviewed debrided the lesion discussed physical therapy and gave instructions on stretching exercises and soaks at night.  If symptoms persist will consider other treatment options  X-rays indicate that there is mild collapse of the arch but no indications of arthritis stress fracture

## 2018-12-10 NOTE — Patient Instructions (Signed)

## 2018-12-13 ENCOUNTER — Telehealth: Payer: Self-pay

## 2018-12-13 MED ORDER — SIMVASTATIN 40 MG PO TABS: ORAL_TABLET | ORAL | 1 refills | Status: DC

## 2018-12-13 NOTE — Telephone Encounter (Signed)
-----   Message from Girtha Rm, NP-C sent at 12/12/2018  8:04 PM EDT ----- Ok to refill statin for 6 months. I sent her results to her via mychart.

## 2019-03-29 ENCOUNTER — Other Ambulatory Visit: Payer: Self-pay | Admitting: Family Medicine

## 2019-03-29 ENCOUNTER — Telehealth: Payer: Self-pay | Admitting: Internal Medicine

## 2019-03-29 DIAGNOSIS — Z1231 Encounter for screening mammogram for malignant neoplasm of breast: Secondary | ICD-10-CM

## 2019-03-29 NOTE — Telephone Encounter (Signed)
Pt will be seen thursday

## 2019-03-29 NOTE — Telephone Encounter (Signed)
She would need an exam for [place] to order this. She can see me or another provider for this.

## 2019-03-29 NOTE — Telephone Encounter (Signed)
Found a knot under left armpit and breast center needs an order for ultrasound with mammogram

## 2019-03-31 ENCOUNTER — Encounter: Payer: Self-pay | Admitting: Family Medicine

## 2019-03-31 ENCOUNTER — Other Ambulatory Visit: Payer: Self-pay

## 2019-03-31 ENCOUNTER — Ambulatory Visit (INDEPENDENT_AMBULATORY_CARE_PROVIDER_SITE_OTHER): Payer: BC Managed Care – PPO | Admitting: Family Medicine

## 2019-03-31 VITALS — BP 110/70 | HR 68 | Temp 97.9°F | Wt 212.6 lb

## 2019-03-31 DIAGNOSIS — M79642 Pain in left hand: Secondary | ICD-10-CM | POA: Diagnosis not present

## 2019-03-31 DIAGNOSIS — R7303 Prediabetes: Secondary | ICD-10-CM | POA: Diagnosis not present

## 2019-03-31 DIAGNOSIS — R2232 Localized swelling, mass and lump, left upper limb: Secondary | ICD-10-CM | POA: Diagnosis not present

## 2019-03-31 LAB — POCT GLYCOSYLATED HEMOGLOBIN (HGB A1C): Hemoglobin A1C: 6 % — AB (ref 4.0–5.6)

## 2019-03-31 NOTE — Progress Notes (Signed)
   Subjective:    Patient ID: [name], female    DOB: Jun 14, 1965, 53 y.o.   MRN: [id number]  HPI Chief Complaint  Patient presents with  . knot under arm    knot under arm. noticied it like a week ago.    Here with complaints of a knot she found in her left armpit last week. No skin changes or nipple discharge.   Last mammogram in 02/2018 for left breast mass and benign findings.   She also complains of intermittent left hand pain. Pain is sharp and brief and only with certain movements.   Would like to have her Hgb A1c checked today. Last one in July 2020 was 6.2%. States she cut back on sugary drinks and has been walking more.   She stopped smoking.   Denies fever, chills, dizziness, chest pain, palpitations, shortness of breath, abdominal pain, N/V/D, urinary symptoms, LE edema.     Review of Systems Pertinent positives and negatives in the history of present illness.     Objective:   Physical Exam Constitutional:      General: She is not in acute distress.    Appearance: Normal appearance. She is not ill-appearing.  Neck:     Musculoskeletal: Normal range of motion and neck supple.  Cardiovascular:     Rate and Rhythm: Normal rate and regular rhythm.     Pulses: Normal pulses.  Pulmonary:     Effort: Pulmonary effort is normal.     Breath sounds: Normal breath sounds.  Chest:       Comments: Pea size superficial mass in her left axilla. Soft, round, moveable and non tender. No other masses palpated.  Musculoskeletal:     Left wrist: Normal.     Left hand: Normal.  Lymphadenopathy:     Cervical: No cervical adenopathy.     Upper Body:     Right upper body: No supraclavicular or axillary adenopathy.     Left upper body: No supraclavicular or axillary adenopathy.  Neurological:     Mental Status: She is alert.    BP 110/70   Pulse 68   Temp 97.9 F (36.6 C) (Oral)   Wt 212 lb 9.6 oz (96.4 kg)   LMP 10/08/2011   BMI 32.33 kg/m        Assessment & Plan:  Mass of left axilla - Plan: US BREAST LTD UNI LEFT INC AXILLA, MM DIAG BREAST TOMO BILATERAL  Prediabetes - Plan: HgB A1c  Left hand pain   I will order a breast US and diagnostic mammogram due to finding. She will call to schedule at the [facility].  Left hand exam is normal. Unable to reproduce her pain. She will keep an eye on her symptoms.  Prediabetes- Hgb A1c improved to 6.0%. encouraged her to continue with healthy diet and exercise.

## 2019-04-10 DIAGNOSIS — Z20828 Contact with and (suspected) exposure to other viral communicable diseases: Secondary | ICD-10-CM | POA: Diagnosis not present

## 2019-04-12 HISTORY — PX: BREAST BIOPSY: SHX20

## 2019-04-13 ENCOUNTER — Other Ambulatory Visit: Payer: Self-pay | Admitting: Family Medicine

## 2019-04-13 ENCOUNTER — Other Ambulatory Visit: Payer: Self-pay

## 2019-04-13 ENCOUNTER — Ambulatory Visit
Admission: RE | Admit: 2019-04-13 | Discharge: 2019-04-13 | Disposition: A | Discharge status: Home / Self Care | Payer: BC Managed Care – PPO | Source: Ambulatory Visit | Attending: Family Medicine | Admitting: Family Medicine

## 2019-04-13 ENCOUNTER — Ambulatory Visit
Admission: RE | Admit: 2019-04-13 | Discharge: 2019-04-13 | Disposition: A | Discharge status: Home / Self Care | Payer: BLUE CROSS/BLUE SHIELD | Source: Ambulatory Visit | Attending: Family Medicine | Admitting: Family Medicine

## 2019-04-13 DIAGNOSIS — R2232 Localized swelling, mass and lump, left upper limb: Secondary | ICD-10-CM

## 2019-04-13 DIAGNOSIS — R928 Other abnormal and inconclusive findings on diagnostic imaging of breast: Secondary | ICD-10-CM | POA: Diagnosis not present

## 2019-04-13 DIAGNOSIS — R921 Mammographic calcification found on diagnostic imaging of breast: Secondary | ICD-10-CM | POA: Diagnosis not present

## 2019-04-20 ENCOUNTER — Ambulatory Visit
Admission: RE | Admit: 2019-04-20 | Discharge: 2019-04-20 | Disposition: A | Discharge status: Home / Self Care | Payer: BC Managed Care – PPO | Source: Ambulatory Visit | Attending: Family Medicine | Admitting: Family Medicine

## 2019-04-20 ENCOUNTER — Other Ambulatory Visit: Payer: Self-pay

## 2019-04-20 DIAGNOSIS — R921 Mammographic calcification found on diagnostic imaging of breast: Secondary | ICD-10-CM

## 2019-04-20 DIAGNOSIS — N6092 Unspecified benign mammary dysplasia of left breast: Secondary | ICD-10-CM | POA: Diagnosis not present

## 2019-04-20 DIAGNOSIS — R2232 Localized swelling, mass and lump, left upper limb: Secondary | ICD-10-CM

## 2019-04-20 DIAGNOSIS — N6011 Diffuse cystic mastopathy of right breast: Secondary | ICD-10-CM | POA: Diagnosis not present

## 2019-04-25 ENCOUNTER — Ambulatory Visit: Payer: Self-pay | Admitting: Surgery

## 2019-04-25 DIAGNOSIS — N6092 Unspecified benign mammary dysplasia of left breast: Secondary | ICD-10-CM

## 2019-04-25 DIAGNOSIS — Z803 Family history of malignant neoplasm of breast: Secondary | ICD-10-CM | POA: Diagnosis not present

## 2019-04-25 NOTE — H&P (Signed)
[name] Documented: 04/25/2019 11:19 AM Location: [facility] Patient #: [id number] DOB: 1965/07/11 Married / Language: English / Race: Black or African American Female  History of Present Illness [name] A. Amy Kawamoto MD; 04/25/2019 12:01 PM) Patient words: Patient sent at the request of Dr. Rosana Ortiz for abnormal mammogram with history of left breast mass noted by patient. The patient noticed an area in her left axilla about a month ago. She underwent bilateral diagnostic mammograms which showed calcifications in both breasts in the upper outer quadrant. The right side was biopsied and found to be fibrocystic change. A site was found to show atypical ductal hyperplasia. She has a first-degree and with breast cancer but is unclear of age. No other history of breast cancer or other cancers that she can remember. She has no other complaints.   Patient presents for evaluation of palpable abnormality within the left axilla.  EXAM: DIGITAL DIAGNOSTIC BILATERAL MAMMOGRAM WITH CAD AND TOMO  ULTRASOUND LEFT BREAST  COMPARISON: Previous exam(s).  ACR Breast Density Category a: The breast tissue is almost entirely fatty.  FINDINGS: Interval development of a 5 mm group of coarse heterogeneous calcifications within the upper-outer right breast, further evaluated with magnification views.  Interval development of an 8 mm group of coarse heterogeneous calcifications within the upper-outer left breast.  No additional masses, calcifications or distortion identified within either breast.  Mammographic images were processed with CAD.  On physical exam, no discrete mass is palpated within the left axilla.  Targeted ultrasound is performed, showing normal tissue without suspicious mass within the left axilla at the site of palpable concern. Normal left axillary lymph nodes are identified.  IMPRESSION: 1. Indeterminate calcifications within the upper-outer right  breast. 2. Indeterminate calcifications within the upper-outer left breast. 3. No suspicious abnormality at the site of palpable concern left axilla.  RECOMMENDATION: Stereotactic guided core needle biopsy right breast calcifications.  Stereotactic guided core needle biopsy left breast calcifications.  I have discussed the findings and recommendations with the patient. If applicable, a reminder letter will be sent to the patient regarding the next appointment.  BI-RADS CATEGORY 4: Suspicious.       Diagnosis 1. Breast, right, needle core biopsy, UOQ coil clip - FIBROCYSTIC CHANGE WITH CALCIFICATIONS. - NO MALIGNANCY IDENTIFIED. 2. Breast, left, needle core biopsy, UOQ - ATYPICAL DUCTAL HYPERPLASIA WITH CALCIFICATIONS. Microscopic Comment 1. The case was called to [name] on 04/21/2019.  The patient is a 53 year old female.   Past Surgical History [name], CMA; 04/25/2019 11:20 AM) Breast Biopsy Bilateral. Oral Surgery  Diagnostic Studies History [name], CMA; 04/25/2019 11:20 AM) Colonoscopy 1-5 years ago Mammogram within last year Pap Smear 1-5 years ago  Allergies [name], CMA; 04/25/2019 11:20 AM) Ibuprofen *ANALGESICS - ANTI-INFLAMMATORY* Aspirin *ANALGESICS - NonNarcotic*  Medication History ([name], CMA; 04/25/2019 11:21 AM) Simvastatin (40MG  Tablet, Oral) Active. Tylenol (Oral) Specific strength unknown - Active. Medications Reconciled  Social History [name], CMA; 04/25/2019 11:20 AM) Alcohol use Occasional alcohol use. Caffeine use Carbonated beverages. No drug use Tobacco use Former smoker.  Family History Amy Lull R. [name], CMA; 04/25/2019 11:20 AM) Arthritis Mother. Breast Cancer Family Members In General. Cerebrovascular Accident Family Members In General. Cervical Cancer Family Members In General. Diabetes Mellitus Family Members In  General. Heart Disease Family Members In General. Heart disease in female family member before age 18 Hypertension Family Members In General, Mother, Sister. Kidney Disease Family Members In General.  Respiratory Condition Mother, Sister. Thyroid problems Family Members In General.  Pregnancy / Birth History Amy Lull R. [name], CMA; 04/25/2019 11:20 AM) Age at menarche 72 years. Age of menopause <43 Contraceptive History Oral contraceptives. Gravida 4 Irregular periods Maternal age 34-20 Para 2  Other Problems Amy Lull R. [name], CMA; 04/25/2019 11:20 AM) Hypercholesterolemia Oophorectomy     Review of Systems [facility] R. Ortiz CMA; 04/25/2019 11:20 AM) General Not Present- Appetite Loss, Chills, Fatigue, Fever, Night Sweats, Weight Gain and Weight Loss. Skin Not Present- Change in Wart/Mole, Dryness, Hives, Jaundice, New Lesions, Non-Healing Wounds, Rash and Ulcer. HEENT Not Present- Earache, Hearing Loss, Hoarseness, Nose Bleed, Oral Ulcers, Ringing in the Ears, Seasonal Allergies, Sinus Pain, Sore Throat, Visual Disturbances, Wears glasses/contact lenses and Yellow Eyes. Respiratory Not Present- Bloody sputum, Chronic Cough, Difficulty Breathing, Snoring and Wheezing. Breast Present- Breast Pain. Not Present- Breast Mass, Nipple Discharge and Skin Changes. Cardiovascular Not Present- Chest Pain, Difficulty Breathing Lying Down, Leg Cramps, Palpitations, Rapid Heart Rate, Shortness of Breath and Swelling of Extremities. Gastrointestinal Not Present- Abdominal Pain, Bloating, Bloody Stool, Change in Bowel Habits, Chronic diarrhea, Constipation, Difficulty Swallowing, Excessive gas, Gets full quickly at meals, Hemorrhoids, Indigestion, Nausea, Rectal Pain and Vomiting. Female Genitourinary Not Present- Frequency, Nocturia, Painful Urination, Pelvic Pain and Urgency. Musculoskeletal Not Present- Back Pain, Joint Pain, Joint Stiffness, Muscle Pain, Muscle Weakness and  Swelling of Extremities. Neurological Not Present- Decreased Memory, Fainting, Headaches, Numbness, Seizures, Tingling, Tremor, Trouble walking and Weakness. Psychiatric Not Present- Anxiety, Bipolar, Change in Sleep Pattern, Depression, Fearful and Frequent crying. Endocrine Present- Hot flashes. Not Present- Cold Intolerance, Excessive Hunger, Hair Changes, Heat Intolerance and New Diabetes. Hematology Not Present- Blood Thinners, Easy Bruising, Excessive bleeding, Gland problems, HIV and Persistent Infections.  [employer] R. Ortiz CMA; 04/25/2019 11:20 AM) 04/25/2019 11:19 AM Weight: 212.38 lb Height: 66in Body Surface Area: 2.05 m Body Mass Index: 34.28 kg/m  Pulse: 89 (Regular)  BP: 110/72 (Sitting, Left Arm, Standard)        Physical Exam (Latifa Noble A. Delyla Sandeen MD; 04/25/2019 12:02 PM)  General Mental Status-Alert. General Appearance-Consistent with stated age. Hydration-Well hydrated. Voice-Normal.  Eye Eyeball - Bilateral-Extraocular movements intact. Sclera/Conjunctiva - Bilateral-No scleral icterus.  Breast Breast - Left-Symmetric, Non Tender, No Biopsy scars, no Dimpling - Left, No Inflammation, No Lumpectomy scars, No Mastectomy scars, No Peau d' Orange. Breast - Right-Symmetric, Non Tender, No Biopsy scars, no Dimpling - Right, No Inflammation, No Lumpectomy scars, No Mastectomy scars, No Peau d' Orange. Breast Lump-No Palpable Breast Mass.  Cardiovascular Note: Normal sinus rhythm  Neurologic Neurologic evaluation reveals -alert and oriented x 3 with no impairment of recent or remote memory. Mental Status-Normal.  Musculoskeletal Normal Exam - Left-Upper Extremity Strength Normal and Lower Extremity Strength Normal. Normal Exam - Right-Upper Extremity Strength Normal and Lower Extremity Strength Normal.  Lymphatic Head & Neck  General Head & Neck Lymphatics: Bilateral - Description - Normal. Axillary  General  Axillary Region: Bilateral - Description - Normal. Tenderness - Non Tender.    Assessment & Plan (Estha Few A. Graham Doukas MD; 04/25/2019 12:03 PM)  ATYPICAL DUCTAL HYPERPLASIA OF LEFT BREAST (N60.92) Impression: Discussed left breast seed localized lumpectomy. Risk of lumpectomy include bleeding, infection, seroma, more surgery, use of seed/wire, wound care, cosmetic deformity and the need for other treatments, death , blood clots, death. Pt agrees to proceed.  Refer for genetic evaluation. Lifetime risk of breast cancer calculation of over 20%  Current Plans You are being scheduled for surgery- Our schedulers will  call you.  You should hear from our office's scheduling department within 5 working days about the location, date, and time of surgery. We try to make accommodations for patient's preferences in scheduling surgery, but sometimes the OR schedule or the surgeon's schedule prevents [place] from making those accommodations.  If you have not heard from our office [phone]) in 5 working days, call the office and ask for your surgeon's nurse.  If you have other questions about your diagnosis, plan, or surgery, call the office and ask for your surgeon's nurse.  Pt Education - [facility] Breast Biopsy HCI: discussed with patient and provided information.  FAMILY HISTORY OF BREAST CANCER (Z80.3) Impression: genetic referral

## 2019-05-02 ENCOUNTER — Other Ambulatory Visit: Payer: Self-pay

## 2019-05-02 ENCOUNTER — Ambulatory Visit (INDEPENDENT_AMBULATORY_CARE_PROVIDER_SITE_OTHER): Payer: BC Managed Care – PPO | Admitting: Family Medicine

## 2019-05-02 ENCOUNTER — Encounter: Payer: Self-pay | Admitting: Family Medicine

## 2019-05-02 VITALS — BP 110/64 | HR 77 | Temp 97.7°F | Wt 214.2 lb

## 2019-05-02 DIAGNOSIS — N898 Other specified noninflammatory disorders of vagina: Secondary | ICD-10-CM | POA: Diagnosis not present

## 2019-05-02 DIAGNOSIS — N6320 Unspecified lump in the left breast, unspecified quadrant: Secondary | ICD-10-CM

## 2019-05-02 DIAGNOSIS — N644 Mastodynia: Secondary | ICD-10-CM | POA: Diagnosis not present

## 2019-05-02 LAB — POCT WET PREP (WET MOUNT)
Clue Cells Wet Prep Whiff POC: POSITIVE
Trichomonas Wet Prep HPF POC: ABSENT

## 2019-05-02 MED ORDER — METRONIDAZOLE 0.75 % VA GEL: 1.0000 | Freq: Every day | VAGINAL | 0 refills | Status: DC

## 2019-05-02 NOTE — Progress Notes (Signed)
   Subjective:    Patient ID: [name], female    DOB: [birth year], 53 y.o.   MRN: [id number]  HPI Chief Complaint  Patient presents with  . knot    knot under breast. feels like an abcess there since having a biposy done.   Marland Kitchen discharge    started having vaginal discharge over the weekend. not taken anything over the counter for this   She is here with concerns regarding a firm area over the site of recent biopsy of her left breast. States pain has improved but she wants to make sure what she is feeling is normal.  Denies redness, warmth, skin changes, fever, chills, N/V/D.   States she discussed symptoms with breast surgeon and was advised to use heat and vitamin E topical.   States she is due to have surgery on the same area soon due to abnormal tissue that is "suspicious".   Dr. Brantley Stage is her surgeon. Surgery TBD.   She has a different complaint as well. Complains of vaginal discharge for the past 3 days. Clear discharge with a "fishy odor".  Declines STD testing. Denies having sex in over a year.   No abdominal pain, back pain, urinary symptoms. No vaginal itching or irritation.   Has CPE scheduled for January    Review of Systems Pertinent positives and negatives in the history of present illness.     Objective:   Physical Exam Exam conducted with a chaperone present.  Constitutional:      General: She is not in acute distress.    Appearance: Normal appearance. She is not ill-appearing.  Pulmonary:     Effort: Pulmonary effort is normal.  Chest:     Breasts:        Right: No nipple discharge or tenderness.        Left: No swelling, nipple discharge, skin change or tenderness.       Comments: Area of firmness beneath recent incision without erythema, induration or fluctuance. Normal appearing breast otherwise.  Genitourinary:    General: Normal vulva.     Labia:        Right: No rash or tenderness.        Left: No rash or tenderness.     Neurological:     Mental Status: She is alert.    BP 110/64   Pulse 77   Temp 97.7 F (36.5 C)   Wt 214 lb 3.2 oz (97.2 kg)   LMP 10/08/2011   BMI 32.57 kg/m       Assessment & Plan:  Soreness breast -no sign of abscess or infection. Recommend she continue with advice of surgeon, using heat and topical vitamin E. She can let me or her surgeon know if she develops signs of infection. We discussed these symptoms.   Lump of left breast -upcoming surgery   Vaginal discharge - Plan: POCT Wet Prep [facility]), metroNIDAZOLE (METROGEL VAGINAL) 0.75 % vaginal gel -wet prep +BV. Discussed treatment options and she prefers to use topical vs oral. Declines STD testing, no recent sexual activity. Follow up if not back to baseline after completing it.

## 2019-05-09 ENCOUNTER — Other Ambulatory Visit: Payer: Self-pay | Admitting: Surgery

## 2019-05-09 ENCOUNTER — Telehealth: Payer: Self-pay | Admitting: Genetic Counselor

## 2019-05-09 DIAGNOSIS — N6092 Unspecified benign mammary dysplasia of left breast: Secondary | ICD-10-CM

## 2019-05-09 NOTE — Telephone Encounter (Signed)
Scheduled per new patient referrals, appointment made on 01/07. My chart visit.

## 2019-05-10 ENCOUNTER — Ambulatory Visit: Payer: BC Managed Care – PPO | Attending: Internal Medicine

## 2019-05-10 DIAGNOSIS — Z20828 Contact with and (suspected) exposure to other viral communicable diseases: Secondary | ICD-10-CM | POA: Diagnosis not present

## 2019-05-10 DIAGNOSIS — Z20822 Contact with and (suspected) exposure to covid-19: Secondary | ICD-10-CM

## 2019-05-11 ENCOUNTER — Other Ambulatory Visit: Payer: Self-pay | Admitting: Surgery

## 2019-05-11 DIAGNOSIS — N6320 Unspecified lump in the left breast, unspecified quadrant: Secondary | ICD-10-CM

## 2019-05-11 LAB — NOVEL CORONAVIRUS, NAA: SARS-CoV-2, NAA: NOT DETECTED

## 2019-05-16 ENCOUNTER — Other Ambulatory Visit: Payer: Self-pay

## 2019-05-16 ENCOUNTER — Ambulatory Visit
Admission: RE | Admit: 2019-05-16 | Discharge: 2019-05-16 | Disposition: A | Discharge status: Home / Self Care | Payer: BC Managed Care – PPO | Source: Ambulatory Visit | Attending: Surgery | Admitting: Surgery

## 2019-05-16 DIAGNOSIS — R928 Other abnormal and inconclusive findings on diagnostic imaging of breast: Secondary | ICD-10-CM | POA: Diagnosis not present

## 2019-05-16 DIAGNOSIS — N6489 Other specified disorders of breast: Secondary | ICD-10-CM | POA: Diagnosis not present

## 2019-05-16 DIAGNOSIS — N6320 Unspecified lump in the left breast, unspecified quadrant: Secondary | ICD-10-CM

## 2019-05-19 ENCOUNTER — Inpatient Hospital Stay: Payer: BC Managed Care – PPO | Attending: Genetic Counselor | Admitting: Licensed Clinical Social Worker

## 2019-05-19 ENCOUNTER — Encounter: Payer: Self-pay | Admitting: Licensed Clinical Social Worker

## 2019-05-19 DIAGNOSIS — Z803 Family history of malignant neoplasm of breast: Secondary | ICD-10-CM | POA: Insufficient documentation

## 2019-05-19 DIAGNOSIS — Z801 Family history of malignant neoplasm of trachea, bronchus and lung: Secondary | ICD-10-CM

## 2019-05-19 NOTE — Progress Notes (Signed)
REFERRING PROVIDER: Erroll Luna, MD [address] [place],  [place] [address]  PRIMARY PROVIDER:  Girtha Rm, NP-C  PRIMARY REASON FOR VISIT:  1. Family history of breast cancer   2. Family history of lung cancer     I connected with [name] on 05/19/2019 at 1:00 PM EDT by MyChart video and verified that I am speaking with the correct person using two identifiers.    Patient location: work Provider location: [facility]  HISTORY OF PRESENT ILLNESS:   Ms. Sweeney, a 54 y.o. female, was seen for a [facility] cancer genetics consultation at the request of Dr. Raenette Rover due to a family history of cancer.  Ms. Ulatowski presents to clinic today to discuss the possibility of a hereditary predisposition to cancer, genetic testing, and to further clarify her future cancer risks, as well as potential cancer risks for family members.   Ms. Polzin is a 54 y.o. female with no personal history of cancer. A recent breast biopsy revealed atypical ductal hyperplasia which she is scheduled to have lumpectomy for on 06/07/2019.  CANCER HISTORY:  Oncology History   No history exists.    RISK FACTORS:  Menarche was at age 14.  First live birth at age 60.  OCP use for approximately 14 years.  Ovaries intact: yes.  Hysterectomy: no.  Menopausal status: postmenopausal.  HRT use: 0 years. Colonoscopy: yes; reports polyps, unsure how many, 5 year recal, Mammogram within the last year: yes. Up to date with pelvic exams: yes.   Past Medical History:  Diagnosis Date  . Anemia   . Encounter for routine gynecological examination    Dr. Charlesetta Garibaldi  . Family history of breast cancer   . Family history of lung cancer   . History of blood transfusion 2009   d/t Hg ~4  . Hx of adenomatous colonic polyps 04/21/2017  . Hyperlipidemia 2014  . Prediabetes   . SVD (spontaneous vaginal delivery)    x 2  . Ulcer as a child   gastric ulcer,  unable to take aspirin  . Uterine fibroid     Past Surgical History:  Procedure Laterality Date  . BREAST BIOPSY Left 04/2019   ADH w/ calcs  . BREAST BIOPSY Right 04/2019   fibrocystic change w/ calcs  . DILATATION & CURRETTAGE/HYSTEROSCOPY WITH RESECTOCOPE N/A 10/31/2014   Procedure: DILATATION & CURETTAGE/HYSTEROSCOPY WITH RESECTOCOPE, RESECTION OF POLYP WITH MYOSURE ;  Surgeon: Crawford Givens, MD;  Location: [facility];  Service: Gynecology;  Laterality: N/A;  . ECTOPIC PREGNANCY SURGERY    . MYOMECTOMY    . TONSILLECTOMY  as a child  . TUBAL LIGATION    . WISDOM TOOTH EXTRACTION Bilateral     Social History   Socioeconomic History  . Marital status: Married    Spouse name: Not on file  . Number of children: Not on file  . Years of education: Not on file  . Highest education level: Not on file  Occupational History  . Not on file  Tobacco Use  . Smoking status: Former Smoker    Packs/day: 0.25    Years: 36.00    Pack years: 9.00    Types: Cigarettes  . Smokeless tobacco: Never Used  Substance and Sexual Activity  . Alcohol use: Yes    Comment: 2 drinks /week  . Drug use: No  . Sexual activity: Yes    Birth control/protection: Surgical  Other Topics Concern  . 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    Social Determinants of Health   Financial Resource Strain:   . Difficulty of Paying Living Expenses: Not on file  Food Insecurity:   . Worried About Charity fundraiser in the Last Year: Not on file  . Ran Out of Food in the Last Year: Not on file  Transportation Needs:   . Lack of Transportation (Medical): Not on file  . Lack of Transportation (Non-Medical): Not on file  Physical Activity:   . Days of Exercise per Week: Not on file  . Minutes of Exercise per Session: Not on file   Stress:   . Feeling of Stress : Not on file  Social Connections:   . Frequency of Communication with Friends and Family: Not on file  . Frequency of Social Gatherings with Friends and Family: Not on file  . Attends Religious Services: Not on file  . Active Member of Clubs or Organizations: Not on file  . Attends Archivist Meetings: Not on file  . Marital Status: Not on file     FAMILY HISTORY:  We obtained a detailed, 4-generation family history.  Significant diagnoses are listed below: Family History  Problem Relation Age of Onset  . Hypertension Mother   . Hepatitis Mother        Hepatitis C  . Asthma Sister   . Stroke Maternal Aunt 50       2  . Diabetes Maternal Grandmother        leg amputation  . Heart disease Maternal Grandmother   . Hypertension Maternal Grandmother   . Cancer Paternal Aunt        lung cancer  . Breast cancer Other        mat great aunts x3  . Cirrhosis Maternal Aunt   . Breast cancer Cousin        first cousins once removed x2  . Colon cancer Neg Hx   . Rectal cancer Neg Hx   . Stomach cancer Neg Hx   . Colon polyps Neg Hx    Ms. Jhaveri has 2 daughters, no cancers. She has 1 maternal half brother, 1 maternal half sister, no cancers.  Ms. Ausley mother is living at 63 and has no history of cancer. Patient had 3 maternal aunts and 2 maternal uncles. One aunt had cervical cancer. No known cancers in first cousins. Maternal grandmother passed in her 51s due to kidney issues. This grandmother had 3 sisters who all passed from breast cancer, all diagnosed older than age 33. 2 of these sisters also had daughters who had breast cancer, and one sister had a son who had cancer but patient is unsure the type. Patient's maternal grandfather is deceased, no info.   Ms. Roylance father was killed when patient was 39 months old, she has limited information about this side of the family. Her paternal aunt had lung cancer. No  other known cancers on this side of the family.  Ms. Ballman is unaware of previous family history of genetic testing for hereditary cancer risks. Patient's maternal ancestors are of African American descent, and paternal ancestors are of African American descent. There is no reported Ashkenazi Jewish ancestry. There is no known consanguinity.  GENETIC COUNSELING ASSESSMENT: Ms. Yglesias is a 54 y.o. female with a  family history which is somewhat suggestive of a hereditary cancer syndrome and predisposition to cancer. We, therefore, discussed and recommended the following at today's visit.   DISCUSSION: We discussed that 5 - 10% of breast cancer is hereditary, with most cases associated with BRCA1/BRCA2 genes.  There are other genes that can be associated with hereditary breast cancer syndromes.  We discussed that testing is beneficial for several reasons including knowing how to follow individuals for cancer screenings, and understand if other family members could be at risk for cancer and allow them to undergo genetic testing.   We reviewed the characteristics, features and inheritance patterns of hereditary cancer syndromes. We also discussed genetic testing, including the appropriate family members to test, the process of testing, insurance coverage and turn-around-time for results. We discussed the implications of a negative, positive and/or variant of uncertain significant result. We recommended Ms. Strollo pursue genetic testing for the Common Hereditary Cancers Panel gene panel.   The Common Hereditary Cancers Panel offered by [facility] includes sequencing and/or deletion duplication testing of the following 48 genes: APC, ATM, AXIN2, BARD1, BMPR1A, BRCA1, BRCA2, BRIP1, CDH1, CDKN2A (p14ARF), CDKN2A (p16INK4a), CKD4, CHEK2, CTNNA1, DICER1, EPCAM (Deletion/duplication testing only), GREM1 (promoter region deletion/duplication testing only), KIT, MEN1, MLH1, MSH2, MSH3, MSH6,  MUTYH, NBN, NF1, NHTL1, PALB2, PDGFRA, PMS2, POLD1, POLE, PTEN, RAD50, RAD51C, RAD51D, RNF43, SDHB, SDHC, SDHD, SMAD4, SMARCA4. STK11, TP53, TSC1, TSC2, and VHL.  The following genes were evaluated for sequence changes only: SDHA and HOXB13 c.251G>A variant only.  Based on Ms. Dibenedetto's family history of cancer, she meets medical criteria for genetic testing.   We discussed that some people do not want to undergo genetic testing due to fear of genetic discrimination.  A federal law called the Genetic Information Non-Discrimination Act (GINA) of 2008 helps protect individuals against genetic discrimination based on their genetic test results.  It impacts both health insurance and employment.  For health insurance, it protects against increased premiums, being kicked off insurance or being forced to take a test in order to be insured.  For employment it protects against hiring, firing and promoting decisions based on genetic test results.  Health status due to a cancer diagnosis is not protected under GINA.  This law does not protect life insurance, disability insurance, or other types of insurance.     PLAN: After considering the risks, benefits, and limitations, Ms. Leeth did not wish to pursue genetic testing at today's visit. We understand this decision and remain available to coordinate genetic testing at any time in the future. We, therefore, recommend Ms. Blau continue to follow the cancer screening guidelines given by her primary healthcare provider. We encouraged Ms. Louth to remain in contact with cancer genetics annually so that we can continuously update the family history and inform her of any changes in cancer genetics and testing that may be of benefit for this family.   Ms. Loy questions were answered to her satisfaction today. Our contact information was provided should additional questions or concerns arise. Thank you for the referral and  allowing [place] to share in the care of your patient.   Faith Rogue, MS, [facility] Genetic Counselor [email or web address] Phone: [phone]  The patient was seen for a total of 35 minutes in virtual genetic counseling. [facility] intern Kary Kos was present and assisted with this case.  Drs. Magrinat, Lindi Adie and/or Burr Medico were available for discussion regarding this case.   _______________________________________________________________________ For Office Staff:  Number of people involved in session: 2 Was  an Intern/ student involved with case: yes

## 2019-05-27 ENCOUNTER — Other Ambulatory Visit: Payer: Self-pay

## 2019-05-27 ENCOUNTER — Encounter: Payer: Self-pay | Admitting: Family Medicine

## 2019-05-27 ENCOUNTER — Ambulatory Visit (INDEPENDENT_AMBULATORY_CARE_PROVIDER_SITE_OTHER): Payer: BC Managed Care – PPO | Admitting: Family Medicine

## 2019-05-27 VITALS — BP 106/64 | HR 64 | Temp 98.2°F | Ht 67.5 in | Wt 214.6 lb

## 2019-05-27 DIAGNOSIS — E782 Mixed hyperlipidemia: Secondary | ICD-10-CM | POA: Diagnosis not present

## 2019-05-27 DIAGNOSIS — Z0000 Encounter for general adult medical examination without abnormal findings: Secondary | ICD-10-CM | POA: Diagnosis not present

## 2019-05-27 DIAGNOSIS — Z860101 Personal history of adenomatous and serrated colon polyps: Secondary | ICD-10-CM

## 2019-05-27 DIAGNOSIS — Z2821 Immunization not carried out because of patient refusal: Secondary | ICD-10-CM

## 2019-05-27 DIAGNOSIS — Z86010 Personal history of colonic polyps: Secondary | ICD-10-CM

## 2019-05-27 DIAGNOSIS — M79642 Pain in left hand: Secondary | ICD-10-CM

## 2019-05-27 DIAGNOSIS — R7303 Prediabetes: Secondary | ICD-10-CM | POA: Diagnosis not present

## 2019-05-27 MED ORDER — TRAMADOL HCL 50 MG PO TABS: 50.0000 mg | ORAL_TABLET | Freq: Three times a day (TID) | ORAL | 0 refills | Status: AC | PRN

## 2019-05-27 NOTE — Progress Notes (Signed)
Subjective:    Patient ID: [name], female    DOB: 1966-02-04, 54 y.o.   MRN: [id number]  HPI Chief Complaint  Patient presents with  . fasting cpe    fasting cpe, has obgyn, left hand pain x frequency, feels like a vein she feels under her breast   She is here for a complete physical exam.  Other providers: OB/GYN- Dr. Charlesetta Garibaldi   States she has left breast surgery next Tuesday. States cells are high risk for cancer.Dr. Brantley Stage is doing her surgery   Complains of left hand pain x 2 months. Trouble gripping and pain is now waking her up.  Using Biofreeze and Tylenol.   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  Diet: cut back on sodas  Excerise: none lately   Immunizations: flu shot - declines. Tdap in the past 5 years per patient.   Health maintenance:  Mammogram: in the past month  Colonoscopy: due for recall in 2023 Last Gynecological Exam: July 2020 Last Dental Exam: 2019  Last Eye Exam:  2020  Wears seatbelt always, smoke detectors in home and functioning, does not text while driving and feels safe in home environment.   Reviewed allergies, medications, past medical, surgical, family, and social history.     Review of Systems Review of Systems Constitutional: -fever, -chills, -sweats, -unexpected weight change,-fatigue ENT: -runny nose, -ear pain, -sore throat Cardiology:  -chest pain, -palpitations, -edema Respiratory: -cough, -shortness of breath, -wheezing Gastroenterology: -abdominal pain, -nausea, -vomiting, -diarrhea, -constipation  Hematology: -bleeding or bruising problems Musculoskeletal: -arthralgias, -myalgias, -joint swelling, -back pain Ophthalmology: -vision changes Urology: -dysuria, -difficulty urinating, -hematuria, -urinary frequency, -urgency Neurology: -headache, -weakness, -tingling, -numbness       Objective:   Physical Exam BP 106/64   Pulse 64   Temp  98.2 F (36.8 C)   Ht 5' 7.5" (1.715 m)   Wt 214 lb 9.6 oz (97.3 kg)   LMP 10/08/2011   BMI 33.12 kg/m   General Appearance:    Alert, cooperative, no distress, appears stated age  Head:    Normocephalic, without obvious abnormality, atraumatic  Eyes:    PERRL, conjunctiva/corneas clear, EOM's intact  Ears:    Normal TM's and external ear canals  Nose:   Mask   Throat:   Mask   Neck:   Supple, no lymphadenopathy;  thyroid:  no   enlargement/tenderness/nodules  Back:    Spine nontender, no curvature, ROM normal, no CVA     tenderness  Lungs:     Clear to auscultation bilaterally without wheezes, rales or     ronchi; respirations unlabored  Chest Wall:    No tenderness or deformity   Heart:    Regular rate and rhythm, S1 and S2 normal, no murmur, rub   or gallop  Breast Exam:    OB/GYN  Abdomen:     Soft, non-tender, nondistended, normoactive bowel sounds,    no masses, no hepatosplenomegaly  Genitalia:    OB/GYN  Rectal:    Normal tone, no masses or tenderness; guaiac negative stool  Extremities:   No clubbing, cyanosis or edema. Left hand with mild edema, to dorsum, TTP, no weakness   Pulses:   2+ and symmetric all extremities  Skin:   Skin color, texture, turgor normal, no rashes or lesions  Lymph nodes:   Cervical, supraclavicular, and axillary nodes normal  Neurologic:   CNII-XII intact, normal strength, sensation and gait; reflexes 2+  and symmetric throughout          Psych:   Normal mood, affect, hygiene and grooming.         Assessment & Plan:  Routine general medical examination at a health care facility - Plan: CBC with Differential, Comprehensive metabolic panel, TSH, T4, Free, Lipid Panel -Due today for fasting CPE.  Preventive health care reviewed.  She sees her OB/GYN.  Immunizations reviewed.  Discussed safety and health promotion.  Recommend healthy lifestyle including diet and exercise  Prediabetes -Hemoglobin A1c 6.0% on 03/31/2019.  She is trying to control  with diet and exercise  Mixed hyperlipidemia - Plan: Lipid Panel -Continue on statin and follow-up pending lipid panel  Left hand pain - Plan: Ambulatory referral to Hand Surgery, traMADol (ULTRAM) 50 MG tablet -She has tried conservative treatment and hand pain is worsening.  Tramadol prescribed and discussed potential side effects.  Referral to hand specialist  Hx of adenomatous colonic polyps-up-to-date on colonoscopy  Influenza vaccination declined

## 2019-05-27 NOTE — Patient Instructions (Signed)

## 2019-05-28 LAB — LIPID PANEL
Chol/HDL Ratio: 3.3 ratio (ref 0.0–4.4)
Cholesterol, Total: 158 mg/dL (ref 100–199)
HDL: 48 mg/dL (ref >39)
LDL Chol Calc (NIH): 96 mg/dL (ref 0–99)
Triglycerides: 74 mg/dL (ref 0–149)
VLDL Cholesterol Cal: 14 mg/dL (ref 5–40)

## 2019-05-28 LAB — CBC WITH DIFFERENTIAL/PLATELET
Basophils Absolute: 0 10*3/uL (ref 0.0–0.2)
Basos: 0 %
EOS (ABSOLUTE): 0.1 10*3/uL (ref 0.0–0.4)
Eos: 2 %
Hematocrit: 40 % (ref 34.0–46.6)
Hemoglobin: 13.5 g/dL (ref 11.1–15.9)
Immature Grans (Abs): 0 10*3/uL (ref 0.0–0.1)
Immature Granulocytes: 0 %
Lymphocytes Absolute: 3.4 10*3/uL — ABNORMAL HIGH (ref 0.7–3.1)
Lymphs: 53 %
MCH: 28.7 pg (ref 26.6–33.0)
MCHC: 33.8 g/dL (ref 31.5–35.7)
MCV: 85 fL (ref 79–97)
Monocytes Absolute: 0.5 10*3/uL (ref 0.1–0.9)
Monocytes: 7 %
Neutrophils Absolute: 2.5 10*3/uL (ref 1.4–7.0)
Neutrophils: 38 %
Platelets: 274 10*3/uL (ref 150–450)
RBC: 4.71 x10E6/uL (ref 3.77–5.28)
RDW: 12 % (ref 11.7–15.4)
WBC: 6.5 10*3/uL (ref 3.4–10.8)

## 2019-05-28 LAB — COMPREHENSIVE METABOLIC PANEL
ALT: 25 IU/L (ref 0–32)
AST: 21 IU/L (ref 0–40)
Albumin/Globulin Ratio: 2 (ref 1.2–2.2)
Albumin: 4.8 g/dL (ref 3.8–4.9)
Alkaline Phosphatase: 84 IU/L (ref 39–117)
BUN/Creatinine Ratio: 19 (ref 9–23)
BUN: 13 mg/dL (ref 6–24)
Bilirubin Total: 0.5 mg/dL (ref 0.0–1.2)
CO2: 23 mmol/L (ref 20–29)
Calcium: 9.7 mg/dL (ref 8.7–10.2)
Chloride: 104 mmol/L (ref 96–106)
Creatinine, Ser: 0.7 mg/dL (ref 0.57–1.00)
GFR calc Af Amer: 113 mL/min/{1.73_m2} (ref >59)
GFR calc non Af Amer: 98 mL/min/{1.73_m2} (ref >59)
Globulin, Total: 2.4 g/dL (ref 1.5–4.5)
Glucose: 88 mg/dL (ref 65–99)
Potassium: 4.4 mmol/L (ref 3.5–5.2)
Sodium: 142 mmol/L (ref 134–144)
Total Protein: 7.2 g/dL (ref 6.0–8.5)

## 2019-05-28 LAB — TSH: TSH: 1.1 u[IU]/mL (ref 0.450–4.500)

## 2019-05-28 LAB — T4, FREE: Free T4: 1.42 ng/dL (ref 0.82–1.77)

## 2019-05-31 ENCOUNTER — Other Ambulatory Visit: Payer: Self-pay

## 2019-05-31 ENCOUNTER — Encounter (HOSPITAL_BASED_OUTPATIENT_CLINIC_OR_DEPARTMENT_OTHER): Payer: Self-pay | Admitting: Surgery

## 2019-06-01 DIAGNOSIS — M79642 Pain in left hand: Secondary | ICD-10-CM | POA: Diagnosis not present

## 2019-06-01 DIAGNOSIS — G5632 Lesion of radial nerve, left upper limb: Secondary | ICD-10-CM | POA: Insufficient documentation

## 2019-06-03 ENCOUNTER — Other Ambulatory Visit (HOSPITAL_COMMUNITY)
Admission: RE | Admit: 2019-06-03 | Discharge: 2019-06-03 | Disposition: A | Discharge status: Home / Self Care | Payer: BC Managed Care – PPO | Source: Ambulatory Visit | Attending: Surgery | Admitting: Surgery

## 2019-06-03 DIAGNOSIS — Z20822 Contact with and (suspected) exposure to covid-19: Secondary | ICD-10-CM | POA: Insufficient documentation

## 2019-06-03 DIAGNOSIS — Z01812 Encounter for preprocedural laboratory examination: Secondary | ICD-10-CM | POA: Insufficient documentation

## 2019-06-03 LAB — SARS CORONAVIRUS 2 (TAT 6-24 HRS): SARS Coronavirus 2: NEGATIVE

## 2019-06-03 NOTE — Progress Notes (Addendum)
      Enhanced Recovery after Surgery for Orthopedics Enhanced Recovery after Surgery is a protocol used to improve the stress on your body and your recovery after surgery.  Patient Instructions  Drink at 0700 DOS. (G2 given for hx of pre diabetes)  . The night before surgery:  o No food after midnight. ONLY clear liquids after midnight  . The day of surgery (if you do NOT have diabetes):  o Drink ONE (1) Pre-Surgery Clear Ensure as directed.   o This drink was given to you during your hospital  pre-op appointment visit. o The pre-op nurse will instruct you on the time to drink the  Pre-Surgery Ensure depending on your surgery time. o Finish the drink at the designated time by the pre-op nurse.  o Nothing else to drink after completing the  Pre-Surgery Clear Ensure.  . The day of surgery (if you have diabetes): o Drink ONE (1) Gatorade 2 (G2) as directed. o This drink was given to you during your hospital  pre-op appointment visit.  o The pre-op nurse will instruct you on the time to drink the   Gatorade 2 (G2) depending on your surgery time. o Color of the Gatorade may vary. Red is not allowed. o Nothing else to drink after completing the  Gatorade 2 (G2).         If you have questions, please contact your surgeon's office. Surgical soap given with instructions, pt verbalized understanding.

## 2019-06-06 ENCOUNTER — Ambulatory Visit
Admission: RE | Admit: 2019-06-06 | Discharge: 2019-06-06 | Disposition: A | Discharge status: Home / Self Care | Payer: BC Managed Care – PPO | Source: Ambulatory Visit | Attending: Surgery | Admitting: Surgery

## 2019-06-06 ENCOUNTER — Other Ambulatory Visit: Payer: Self-pay

## 2019-06-06 DIAGNOSIS — N6092 Unspecified benign mammary dysplasia of left breast: Secondary | ICD-10-CM

## 2019-06-07 ENCOUNTER — Ambulatory Visit (HOSPITAL_BASED_OUTPATIENT_CLINIC_OR_DEPARTMENT_OTHER): Payer: BC Managed Care – PPO | Admitting: Certified Registered"

## 2019-06-07 ENCOUNTER — Encounter (HOSPITAL_BASED_OUTPATIENT_CLINIC_OR_DEPARTMENT_OTHER): Payer: Self-pay | Admitting: Surgery

## 2019-06-07 ENCOUNTER — Ambulatory Visit (HOSPITAL_BASED_OUTPATIENT_CLINIC_OR_DEPARTMENT_OTHER)
Admission: RE | Admit: 2019-06-07 | Discharge: 2019-06-07 | Disposition: A | Discharge status: Home / Self Care | Payer: BC Managed Care – PPO | Attending: Surgery | Admitting: Surgery

## 2019-06-07 ENCOUNTER — Ambulatory Visit
Admission: RE | Admit: 2019-06-07 | Discharge: 2019-06-07 | Disposition: A | Discharge status: Home / Self Care | Payer: BC Managed Care – PPO | Source: Ambulatory Visit | Attending: Surgery | Admitting: Surgery

## 2019-06-07 ENCOUNTER — Other Ambulatory Visit: Payer: Self-pay

## 2019-06-07 ENCOUNTER — Encounter (HOSPITAL_BASED_OUTPATIENT_CLINIC_OR_DEPARTMENT_OTHER)
Admission: RE | Disposition: A | Discharge status: Home / Self Care | Payer: Self-pay | Source: Home / Self Care | Attending: Surgery

## 2019-06-07 DIAGNOSIS — Z803 Family history of malignant neoplasm of breast: Secondary | ICD-10-CM | POA: Insufficient documentation

## 2019-06-07 DIAGNOSIS — Z79899 Other long term (current) drug therapy: Secondary | ICD-10-CM | POA: Insufficient documentation

## 2019-06-07 DIAGNOSIS — E7800 Pure hypercholesterolemia, unspecified: Secondary | ICD-10-CM | POA: Diagnosis not present

## 2019-06-07 DIAGNOSIS — N6092 Unspecified benign mammary dysplasia of left breast: Secondary | ICD-10-CM | POA: Insufficient documentation

## 2019-06-07 DIAGNOSIS — D259 Leiomyoma of uterus, unspecified: Secondary | ICD-10-CM | POA: Diagnosis not present

## 2019-06-07 DIAGNOSIS — E785 Hyperlipidemia, unspecified: Secondary | ICD-10-CM | POA: Insufficient documentation

## 2019-06-07 DIAGNOSIS — Z87891 Personal history of nicotine dependence: Secondary | ICD-10-CM | POA: Insufficient documentation

## 2019-06-07 DIAGNOSIS — M7712 Lateral epicondylitis, left elbow: Secondary | ICD-10-CM | POA: Diagnosis not present

## 2019-06-07 HISTORY — PX: BREAST EXCISIONAL BIOPSY: SUR124

## 2019-06-07 HISTORY — PX: BREAST LUMPECTOMY WITH RADIOACTIVE SEED LOCALIZATION: SHX6424

## 2019-06-07 SURGERY — BREAST LUMPECTOMY WITH RADIOACTIVE SEED LOCALIZATION
Anesthesia: General | Site: Breast | Laterality: Left

## 2019-06-07 MED ORDER — PROPOFOL 10 MG/ML IV BOLUS
INTRAVENOUS | Status: AC
  Filled 2019-06-07: qty 20

## 2019-06-07 MED ORDER — OXYCODONE HCL 5 MG PO TABS
ORAL_TABLET | ORAL | Status: AC
  Filled 2019-06-07: qty 1

## 2019-06-07 MED ORDER — MIDAZOLAM HCL 2 MG/2ML IJ SOLN
INTRAMUSCULAR | Status: AC
  Filled 2019-06-07: qty 2

## 2019-06-07 MED ORDER — CHLORHEXIDINE GLUCONATE CLOTH 2 % EX PADS: 6.0000 | MEDICATED_PAD | Freq: Once | CUTANEOUS | Status: DC

## 2019-06-07 MED ORDER — LIDOCAINE 2% (20 MG/ML) 5 ML SYRINGE
INTRAMUSCULAR | Status: DC | PRN
  Administered 2019-06-07: 60 mg via INTRAVENOUS

## 2019-06-07 MED ORDER — ACETAMINOPHEN 500 MG PO TABS
ORAL_TABLET | ORAL | Status: AC
  Filled 2019-06-07: qty 2

## 2019-06-07 MED ORDER — BUPIVACAINE HCL (PF) 0.25 % IJ SOLN
INTRAMUSCULAR | Status: DC | PRN
  Administered 2019-06-07: 19 mL

## 2019-06-07 MED ORDER — ONDANSETRON HCL 4 MG/2ML IJ SOLN: 4.0000 mg | Freq: Four times a day (QID) | INTRAMUSCULAR | Status: DC | PRN

## 2019-06-07 MED ORDER — FENTANYL CITRATE (PF) 100 MCG/2ML IJ SOLN
INTRAMUSCULAR | Status: AC
  Filled 2019-06-07: qty 2

## 2019-06-07 MED ORDER — LACTATED RINGERS IV SOLN: INTRAVENOUS | Status: DC

## 2019-06-07 MED ORDER — DEXAMETHASONE SODIUM PHOSPHATE 10 MG/ML IJ SOLN
INTRAMUSCULAR | Status: DC | PRN
  Administered 2019-06-07: 4 mg via INTRAVENOUS

## 2019-06-07 MED ORDER — ACETAMINOPHEN 500 MG PO TABS
1000.0000 mg | ORAL_TABLET | ORAL | Status: AC
  Administered 2019-06-07: 1000 mg via ORAL

## 2019-06-07 MED ORDER — ONDANSETRON HCL 4 MG/2ML IJ SOLN
INTRAMUSCULAR | Status: AC
  Filled 2019-06-07: qty 2

## 2019-06-07 MED ORDER — FENTANYL CITRATE (PF) 100 MCG/2ML IJ SOLN
25.0000 ug | INTRAMUSCULAR | Status: DC | PRN
  Administered 2019-06-07 (×2): 50 ug via INTRAVENOUS

## 2019-06-07 MED ORDER — SUCCINYLCHOLINE CHLORIDE 200 MG/10ML IV SOSY
PREFILLED_SYRINGE | INTRAVENOUS | Status: AC
  Filled 2019-06-07: qty 10

## 2019-06-07 MED ORDER — PROPOFOL 10 MG/ML IV BOLUS
INTRAVENOUS | Status: DC | PRN
  Administered 2019-06-07: 200 mg via INTRAVENOUS

## 2019-06-07 MED ORDER — ONDANSETRON HCL 4 MG/2ML IJ SOLN
INTRAMUSCULAR | Status: DC | PRN
  Administered 2019-06-07: 4 mg via INTRAVENOUS

## 2019-06-07 MED ORDER — LIDOCAINE 2% (20 MG/ML) 5 ML SYRINGE
INTRAMUSCULAR | Status: AC
  Filled 2019-06-07: qty 5

## 2019-06-07 MED ORDER — FENTANYL CITRATE (PF) 250 MCG/5ML IJ SOLN
INTRAMUSCULAR | Status: DC | PRN
  Administered 2019-06-07: 100 ug via INTRAVENOUS

## 2019-06-07 MED ORDER — DEXTROSE 5 % IV SOLN: 3.0000 g | INTRAVENOUS | Status: DC

## 2019-06-07 MED ORDER — HYDROCODONE-ACETAMINOPHEN 5-325 MG PO TABS: 1.0000 | ORAL_TABLET | Freq: Four times a day (QID) | ORAL | 0 refills | Status: DC | PRN

## 2019-06-07 MED ORDER — OXYCODONE HCL 5 MG PO TABS
5.0000 mg | ORAL_TABLET | Freq: Once | ORAL | Status: AC | PRN
  Administered 2019-06-07: 5 mg via ORAL

## 2019-06-07 MED ORDER — MIDAZOLAM HCL 5 MG/5ML IJ SOLN
INTRAMUSCULAR | Status: DC | PRN
  Administered 2019-06-07: 2 mg via INTRAVENOUS

## 2019-06-07 MED ORDER — OXYCODONE HCL 5 MG/5ML PO SOLN: 5.0000 mg | Freq: Once | ORAL | Status: AC | PRN

## 2019-06-07 MED ORDER — CEFAZOLIN SODIUM-DEXTROSE 2-4 GM/100ML-% IV SOLN
INTRAVENOUS | Status: AC
  Filled 2019-06-07: qty 100

## 2019-06-07 SURGICAL SUPPLY — 49 items
ADH SKN CLS APL DERMABOND .7 (GAUZE/BANDAGES/DRESSINGS) ×1
APL PRP STRL LF DISP 70% ISPRP (MISCELLANEOUS) ×1
APPLIER CLIP 9.375 MED OPEN (MISCELLANEOUS)
APR CLP MED 9.3 20 MLT OPN (MISCELLANEOUS)
BINDER BREAST XXLRG (GAUZE/BANDAGES/DRESSINGS) ×2 IMPLANT
BLADE SURG 15 STRL LF DISP TIS (BLADE) ×1 IMPLANT
BLADE SURG 15 STRL SS (BLADE) ×3
CANISTER SUC SOCK COL 7IN (MISCELLANEOUS) IMPLANT
CANISTER SUCT 1200ML W/VALVE (MISCELLANEOUS) IMPLANT
CHLORAPREP W/TINT 26 (MISCELLANEOUS) ×3 IMPLANT
CLIP APPLIE 9.375 MED OPEN (MISCELLANEOUS) IMPLANT
COVER BACK TABLE 60X90IN (DRAPES) ×3 IMPLANT
COVER MAYO STAND STRL (DRAPES) ×3 IMPLANT
COVER PROBE W GEL 5X96 (DRAPES) ×3 IMPLANT
DECANTER SPIKE VIAL GLASS SM (MISCELLANEOUS) IMPLANT
DERMABOND ADVANCED (GAUZE/BANDAGES/DRESSINGS) ×2
DERMABOND ADVANCED .7 DNX12 (GAUZE/BANDAGES/DRESSINGS) ×1 IMPLANT
DRAPE LAPAROTOMY 100X72 PEDS (DRAPES) ×3 IMPLANT
DRAPE UTILITY XL STRL (DRAPES) ×3 IMPLANT
ELECT COATED BLADE 2.86 ST (ELECTRODE) ×3 IMPLANT
ELECT REM PT RETURN 9FT ADLT (ELECTROSURGICAL) ×3
ELECTRODE REM PT RTRN 9FT ADLT (ELECTROSURGICAL) ×1 IMPLANT
GLOVE BIOGEL PI IND STRL 7.5 (GLOVE) IMPLANT
GLOVE BIOGEL PI IND STRL 8 (GLOVE) ×1 IMPLANT
GLOVE BIOGEL PI INDICATOR 7.5 (GLOVE) ×2
GLOVE BIOGEL PI INDICATOR 8 (GLOVE) ×2
GLOVE ECLIPSE 8.0 STRL XLNG CF (GLOVE) ×3 IMPLANT
GLOVE SURG SS PI 7.5 STRL IVOR (GLOVE) ×2 IMPLANT
GOWN STRL REUS W/ TWL LRG LVL3 (GOWN DISPOSABLE) ×2 IMPLANT
GOWN STRL REUS W/TWL LRG LVL3 (GOWN DISPOSABLE) ×6
HEMOSTAT ARISTA ABSORB 3G PWDR (HEMOSTASIS) IMPLANT
HEMOSTAT SNOW SURGICEL 2X4 (HEMOSTASIS) IMPLANT
KIT MARKER MARGIN INK (KITS) ×3 IMPLANT
NDL HYPO 25X1 1.5 SAFETY (NEEDLE) ×1 IMPLANT
NEEDLE HYPO 25X1 1.5 SAFETY (NEEDLE) ×3 IMPLANT
NS IRRIG 1000ML POUR BTL (IV SOLUTION) ×3 IMPLANT
PACK BASIN DAY SURGERY FS (CUSTOM PROCEDURE TRAY) ×3 IMPLANT
PENCIL SMOKE EVACUATOR (MISCELLANEOUS) ×3 IMPLANT
SLEEVE SCD COMPRESS KNEE MED (MISCELLANEOUS) ×3 IMPLANT
SPONGE LAP 4X18 RFD (DISPOSABLE) ×3 IMPLANT
SUT MNCRL AB 4-0 PS2 18 (SUTURE) ×3 IMPLANT
SUT SILK 2 0 SH (SUTURE) IMPLANT
SUT VICRYL 3-0 CR8 SH (SUTURE) ×3 IMPLANT
SYR CONTROL 10ML LL (SYRINGE) ×3 IMPLANT
TOWEL GREEN STERILE FF (TOWEL DISPOSABLE) ×3 IMPLANT
TRAY FAXITRON CT DISP (TRAY / TRAY PROCEDURE) ×3 IMPLANT
TUBE CONNECTING 20'X1/4 (TUBING)
TUBE CONNECTING 20X1/4 (TUBING) IMPLANT
YANKAUER SUCT BULB TIP NO VENT (SUCTIONS) IMPLANT

## 2019-06-07 NOTE — Anesthesia Procedure Notes (Signed)
Procedure Name: LMA Insertion Date/Time: 06/07/2019 11:44 AM Performed by: Myna Bright, CRNA Pre-anesthesia Checklist: Patient identified, Emergency Drugs available, Suction available and Patient being monitored Patient Re-evaluated:Patient Re-evaluated prior to induction Oxygen Delivery Method: Circle system utilized Preoxygenation: Pre-oxygenation with 100% oxygen Induction Type: IV induction Ventilation: Mask ventilation without difficulty LMA: LMA inserted LMA Size: 4.0 Number of attempts: 1 Placement Confirmation: positive ETCO2 and breath sounds checked- equal and bilateral Tube secured with: Tape Dental Injury: Teeth and Oropharynx as per pre-operative assessment

## 2019-06-07 NOTE — Op Note (Addendum)
Peoperative diagnosis: Left breast atypical ductal hyperplasia  Postoperative diagnosis: Same   Procedure: Left breast seed localized lumpectomy  Surgeon: Erroll Luna M.D.  Anesthesia: Gen. With 0.25% Sensorcaine local  EBL: 20 cc  Specimen: Left breast tissue with clip and radioactive seed in the specimen. Verified with neoprobe and radiographic image showing both seed and clip in specimen  Indications for procedure: The patient presents for left breast  lumpectomy after core biopsy showed ADH. Discussed the rationale for considering excision. Small risk of malignancy associated with  lesion after core biopsy. Discussed observation. Discussed SEED/wire localization. Patient desired excision of left breast ADH. Marland KitchenThe procedure has been discussed with the patient. Alternatives to surgery have been discussed with the patient.  Risks of surgery include bleeding,  Infection,  Seroma formation, death,  and the need for further surgery.   The patient understands and wishes to proceed.   Description of procedure: Patient underwent seed placement as an outpatient. Patient presents today for left breast seed localized lumpectomy. Patient and holding area. Questions are answered and neoprobe used to verify seed location. Patient taken back to the operating room and placed upon the OR table. After induction of general anesthesia, left breast prepped and draped in a sterile fashion. Timeout was done to verify proper  procedure. Neoprobe used and hot spot identified and left breast upper-outer quadrant. This was marked with pen. Curvilinear incision made left upper outer quadrant breast. Dissection used with the help of a neoprobe around the tissue where the seed and clip were located. Tissue removed in its entirety with gross negative  margins.. Neoprobe used and seed within specimen. Radiographs taken which show clip and seed  In specimen.Hemostasis achieved and cavity closed with 3-0 Vicryl and 4-0  Monocryl. Dermabond applied. All final counts found to be correct. Specimen transported to pathology. Patient awoke extubated taken to recovery in satisfactory condition.

## 2019-06-07 NOTE — Interval H&P Note (Signed)
History and Physical Interval Note:  06/07/2019 11:32 AM  [name]  has presented today for surgery, with the diagnosis of LEFT BREAST ATYPICAL DUCTAL HYPERPLASIA.  The various methods of treatment have been discussed with the patient and family. After consideration of risks, benefits and other options for treatment, the patient has consented to  Procedure(s): LEFT BREAST LUMPECTOMY WITH RADIOACTIVE SEED LOCALIZATION (Left) as a surgical intervention.  The patient's history has been reviewed, patient examined, no change in status, stable for surgery.  I have reviewed the patient's chart and labs.  Questions were answered to the patient's satisfaction.     [place]

## 2019-06-07 NOTE — Discharge Instructions (Signed)
[place] Office Phone Number [phone]  BREAST BIOPSY/ PARTIAL MASTECTOMY: POST OP INSTRUCTIONS  Always review your discharge instruction sheet given to you by the facility where your surgery was performed.  IF YOU HAVE DISABILITY OR FAMILY LEAVE FORMS, YOU MUST BRING THEM TO THE OFFICE FOR PROCESSING.  DO NOT GIVE THEM TO YOUR DOCTOR.  1. A prescription for pain medication may be given to you upon discharge.  Take your pain medication as prescribed, if needed.  If narcotic pain medicine is not needed, then you may take acetaminophen (Tylenol) or ibuprofen (Advil) as needed. *You had 1000 mg of tylenol at 9:52 AM 2. Take your usually prescribed medications unless otherwise directed 3. If you need a refill on your pain medication, please contact your pharmacy.  They will contact our office to request authorization.  Prescriptions will not be filled after 5pm or on week-ends. 4. You should eat very light the first 24 hours after surgery, such as soup, crackers, pudding, etc.  Resume your normal diet the day after surgery. 5. Most patients will experience some swelling and bruising in the breast.  Ice packs and a good support bra will help.  Swelling and bruising can take several days to resolve.  6. It is common to experience some constipation if taking pain medication after surgery.  Increasing fluid intake and taking a stool softener will usually help or prevent this problem from occurring.  A mild laxative (Milk of Magnesia or Miralax) should be taken according to package directions if there are no bowel movements after 48 hours. 7. Unless discharge instructions indicate otherwise, you may remove your bandages 24-48 hours after surgery, and you may shower at that time.  You may have steri-strips (small skin tapes) in place directly over the incision.  These strips should be left on the skin for 7-10 days.  If your surgeon used skin glue on the incision, you may shower in 24  hours.  The glue will flake off over the next 2-3 weeks.  Any sutures or staples will be removed at the office during your follow-up visit. 8. ACTIVITIES:  You may resume regular daily activities (gradually increasing) beginning the next day.  Wearing a good support bra or sports bra minimizes pain and swelling.  You may have sexual intercourse when it is comfortable. a. You may drive when you no longer are taking prescription pain medication, you can comfortably wear a seatbelt, and you can safely maneuver your car and apply brakes. b. RETURN TO WORK:  ______________________________________________________________________________________ 9. You should see your doctor in the office for a follow-up appointment approximately two weeks after your surgery.  Your doctor's nurse will typically make your follow-up appointment when she calls you with your pathology report.  Expect your pathology report 2-3 business days after your surgery.  You may call to check if you do not hear from [place] after three days. 10. OTHER INSTRUCTIONS: _______________________________________________________________________________________________ _____________________________________________________________________________________________________________________________________ _____________________________________________________________________________________________________________________________________ _____________________________________________________________________________________________________________________________________  WHEN TO CALL YOUR DOCTOR: 1. Fever over 101.0 2. Nausea and/or vomiting. 3. Extreme swelling or bruising. 4. Continued bleeding from incision. 5. Increased pain, redness, or drainage from the incision.  The clinic staff is available to answer your questions during regular business hours.  Please don't hesitate to call and ask to speak to one of the nurses for clinical concerns.  If you have a  medical emergency, go to the nearest emergency room or call 911.  A surgeon from [facility] is always on call at the  hospital.  For further questions, please visit [email or web address]    Post Anesthesia Home Care Instructions  Activity: Get plenty of rest for the remainder of the day. A responsible individual must stay with you for 24 hours following the procedure.  For the next 24 hours, DO NOT: -Drive a car -Paediatric nurse -Drink alcoholic beverages -Take any medication unless instructed by your physician -Make any legal decisions or sign important papers.  Meals: Start with liquid foods such as gelatin or soup. Progress to regular foods as tolerated. Avoid greasy, spicy, heavy foods. If nausea and/or vomiting occur, drink only clear liquids until the nausea and/or vomiting subsides. Call your physician if vomiting continues.  Special Instructions/Symptoms: Your throat may feel dry or sore from the anesthesia or the breathing tube placed in your throat during surgery. If this causes discomfort, gargle with warm salt water. The discomfort should disappear within 24 hours.  If you had a scopolamine patch placed behind your ear for the management of post- operative nausea and/or vomiting:  1. The medication in the patch is effective for 72 hours, after which it should be removed.  Wrap patch in a tissue and discard in the trash. Wash hands thoroughly with soap and water. 2. You may remove the patch earlier than 72 hours if you experience unpleasant side effects which may include dry mouth, dizziness or visual disturbances. 3. Avoid touching the patch. Wash your hands with soap and water after contact with the patch.

## 2019-06-07 NOTE — H&P (Signed)
[name]  Location: [facility] Patient #: [id number] DOB: [birth year] Married / Language: English / Race: Black or African American Female  History of Present Illness Marcello Moores A. Hammad Finkler MD; Patient words: Patient sent at the request of Dr. Rosana Hoes for abnormal mammogram with history of left breast mass noted by patient. The patient noticed an area in her left axilla about a month ago. She underwent bilateral diagnostic mammograms which showed calcifications in both breasts in the upper outer quadrant. The right side was biopsied and found to be fibrocystic change. A site was found to show atypical ductal hyperplasia. She has a first-degree and with breast cancer but is unclear of age. No other history of breast cancer or other cancers that she can remember. She has no other complaints.   Patient presents for evaluation of palpable abnormality within the left axilla.  EXAM: DIGITAL DIAGNOSTIC BILATERAL MAMMOGRAM WITH CAD AND TOMO  ULTRASOUND LEFT BREAST  COMPARISON: Previous exam(s).  ACR Breast Density Category a: The breast tissue is almost entirely fatty.  FINDINGS: Interval development of a 5 mm group of coarse heterogeneous calcifications within the upper-outer right breast, further evaluated with magnification views.  Interval development of an 8 mm group of coarse heterogeneous calcifications within the upper-outer left breast.  No additional masses, calcifications or distortion identified within either breast.  Mammographic images were processed with CAD.  On physical exam, no discrete mass is palpated within the left axilla.  Targeted ultrasound is performed, showing normal tissue without suspicious mass within the left axilla at the site of palpable concern. Normal left axillary lymph nodes are identified.  IMPRESSION: 1. Indeterminate calcifications within the upper-outer right breast. 2. Indeterminate calcifications  within the upper-outer left breast. 3. No suspicious abnormality at the site of palpable concern left axilla.  RECOMMENDATION: Stereotactic guided core needle biopsy right breast calcifications.  Stereotactic guided core needle biopsy left breast calcifications.  I have discussed the findings and recommendations with the patient. If applicable, a reminder letter will be sent to the patient regarding the next appointment.  BI-RADS CATEGORY 4: Suspicious.       Diagnosis 1. Breast, right, needle core biopsy, UOQ coil clip - FIBROCYSTIC CHANGE WITH CALCIFICATIONS. - NO MALIGNANCY IDENTIFIED. 2. Breast, left, needle core biopsy, UOQ - ATYPICAL DUCTAL HYPERPLASIA WITH CALCIFICATIONS. Microscopic Comment 1. The case was called to [facility] on 04/21/2019.  The patient is a 54 year old female.   Past Surgical History Sharyn Lull R. Rolena Infante, CMA;  Breast Biopsy Bilateral. Oral Surgery  Diagnostic Studies History Sharyn Lull R. Brooks, CMA; 0 11:20 AM) Colonoscopy 1-5 years ago Mammogram within last year Pap Smear 1-5 years ago  Allergies Sharyn Lull R. Brooks, CMA; 0 11:20 AM) Ibuprofen *ANALGESICS - ANTI-INFLAMMATORY* Aspirin *ANALGESICS - NonNarcotic*  Medication History (Michelle R. Brooks, CMA; 11:21 AM) Simvastatin (40MG  Tablet, Oral) Active. Tylenol (Oral) Specific strength unknown - Active. Medications Reconciled  Social History Sharyn Lull R. Brooks, CMA; 10 11:20 AM) Alcohol use Occasional alcohol use. Caffeine use Carbonated beverages. No drug use Tobacco use Former smoker.  Family History Sharyn Lull R. Brooks, CMA; 20 AM) Arthritis Mother. Breast Cancer Family Members In General. Cerebrovascular Accident Family Members In General. Cervical Cancer Family Members In General. Diabetes Mellitus Family Members In General. Heart Disease Family Members In General. Heart disease in female family member before  age 54 Hypertension Family Members In General, Mother, Sister. Kidney Disease Family Members In General. Respiratory Condition Mother, Sister. Thyroid problems Family Members In General.  Pregnancy / Birth History Sharyn Lull R. Brooks, CMA;20 AM) Age at menarche 36 years. Age of menopause <43 Contraceptive History Oral contraceptives. Gravida 4 Irregular periods Maternal age 6-20 Para 2  Other Problems Sharyn Lull R. Rolena Infante, CMA; 04/25/2019 11:20 AM) Hypercholesterolemia Oophorectomy     Review of Systems [facility] R. Brooks CMA; 04/25/2019 11:20 AM) General Not Present- Appetite Loss, Chills, Fatigue, Fever, Night Sweats, Weight Gain and Weight Loss. Skin Not Present- Change in Wart/Mole, Dryness, Hives, Jaundice, New Lesions, Non-Healing Wounds, Rash and Ulcer. HEENT Not Present- Earache, Hearing Loss, Hoarseness, Nose Bleed, Oral Ulcers, Ringing in the Ears, Seasonal Allergies, Sinus Pain, Sore Throat, Visual Disturbances, Wears glasses/contact lenses and Yellow Eyes. Respiratory Not Present- Bloody sputum, Chronic Cough, Difficulty Breathing, Snoring and Wheezing. Breast Present- Breast Pain. Not Present- Breast Mass, Nipple Discharge and Skin Changes. Cardiovascular Not Present- Chest Pain, Difficulty Breathing Lying Down, Leg Cramps, Palpitations, Rapid Heart Rate, Shortness of Breath and Swelling of Extremities. Gastrointestinal Not Present- Abdominal Pain, Bloating, Bloody Stool, Change in Bowel Habits, Chronic diarrhea, Constipation, Difficulty Swallowing, Excessive gas, Gets full quickly at meals, Hemorrhoids, Indigestion, Nausea, Rectal Pain and Vomiting. Female Genitourinary Not Present- Frequency, Nocturia, Painful Urination, Pelvic Pain and Urgency. Musculoskeletal Not Present- Back Pain, Joint Pain, Joint Stiffness, Muscle Pain, Muscle Weakness and Swelling of Extremities. Neurological Not Present- Decreased Memory, Fainting, Headaches, Numbness,  Seizures, Tingling, Tremor, Trouble walking and Weakness. Psychiatric Not Present- Anxiety, Bipolar, Change in Sleep Pattern, Depression, Fearful and Frequent crying. Endocrine Present- Hot flashes. Not Present- Cold Intolerance, Excessive Hunger, Hair Changes, Heat Intolerance and New Diabetes. Hematology Not Present- Blood Thinners, Easy Bruising, Excessive bleeding, Gland problems, HIV and Persistent Infections.  Vitals [employer] R. Brooks CMA; 04/25/2019 11:19 AM Weight: 212.38 lb Height: 66in Body Surface Area: 2.05 m Body Mass Index: 34.28 kg/m  Pulse: 89 (Regular)  BP: 110/72 (Sitting, Left Arm, Standard)        Physical Exam (Tyheem Boughner A. Cresta Riden MD;   General Mental Status-Alert. General Appearance-Consistent with stated age. Hydration-Well hydrated. Voice-Normal.  Eye Eyeball - Bilateral-Extraocular movements intact. Sclera/Conjunctiva - Bilateral-No scleral icterus.  Breast Breast - Left-Symmetric, Non Tender, No Biopsy scars, no Dimpling - Left, No Inflammation, No Lumpectomy scars, No Mastectomy scars, No Peau d' Orange. Breast - Right-Symmetric, Non Tender, No Biopsy scars, no Dimpling - Right, No Inflammation, No Lumpectomy scars, No Mastectomy scars, No Peau d' Orange. Breast Lump-No Palpable Breast Mass.  Cardiovascular Note: Normal sinus rhythm  Neurologic Neurologic evaluation reveals -alert and oriented x 3 with no impairment of recent or remote memory. Mental Status-Normal.  Musculoskeletal Normal Exam - Left-Upper Extremity Strength Normal and Lower Extremity Strength Normal. Normal Exam - Right-Upper Extremity Strength Normal and Lower Extremity Strength Normal.  Lymphatic Head & Neck  General Head & Neck Lymphatics: Bilateral - Description - Normal. Axillary  General Axillary Region: Bilateral - Description - Normal. Tenderness - Non Tender.    Assessment & Plan (Atia Haupt A. Ashlye Oviedo MD;  1  ATYPICAL DUCTAL HYPERPLASIA OF LEFT BREAST (N60.92) Impression: Discussed left breast seed localized lumpectomy. Risk of lumpectomy include bleeding, infection, seroma, more surgery, use of seed/wire, wound care, cosmetic deformity and the need for other treatments, death , blood clots, death. Pt agrees to proceed.  Refer for genetic evaluation. Lifetime risk of breast cancer calculation of over 20%  Current Plans You are being scheduled for surgery- Our schedulers will call you.  You should hear from our office's scheduling department within 5 working days about the location, date, and  time of surgery. We try to make accommodations for patient's preferences in scheduling surgery, but sometimes the OR schedule or the surgeon's schedule prevents [place] from making those accommodations.  If you have not heard from our office [phone]) in 5 working days, call the office and ask for your surgeon's nurse.  If you have other questions about your diagnosis, plan, or surgery, call the office and ask for your surgeon's nurse.  Pt Education - [facility] Breast Biopsy HCI: discussed with patient and provided information.  FAMILY HISTORY OF BREAST CANCER (Z80.3) Impression: genetic referral

## 2019-06-07 NOTE — Anesthesia Preprocedure Evaluation (Signed)
Anesthesia Evaluation  Patient identified by MRN, date of birth, ID band Patient awake    Reviewed: Allergy & Precautions, H&P , NPO status , Patient's Chart, lab work & pertinent test results  Airway Mallampati: II   Neck ROM: full    Dental   Pulmonary former smoker,    breath sounds clear to auscultation       Cardiovascular negative cardio ROS   Rhythm:regular Rate:Normal     Neuro/Psych    GI/Hepatic   Endo/Other    Renal/GU      Musculoskeletal  (+) Arthritis ,   Abdominal   Peds  Hematology   Anesthesia Other Findings   Reproductive/Obstetrics                             Anesthesia Physical Anesthesia Plan  ASA: II  Anesthesia Plan: General   Post-op Pain Management:    Induction: Intravenous  PONV Risk Score and Plan: 3 and Ondansetron, Dexamethasone, Midazolam and Treatment may vary due to age or medical condition  Airway Management Planned: LMA  Additional Equipment:   Intra-op Plan:   Post-operative Plan: Extubation in OR  Informed Consent: I have reviewed the patients History and Physical, chart, labs and discussed the procedure including the risks, benefits and alternatives for the proposed anesthesia with the patient or authorized representative who has indicated his/her understanding and acceptance.       Plan Discussed with: CRNA, Anesthesiologist and Surgeon  Anesthesia Plan Comments:         Anesthesia Quick Evaluation

## 2019-06-07 NOTE — Transfer of Care (Signed)
Immediate Anesthesia Transfer of Care Note  Patient: Raksha Najafi  Procedure(s) Performed: LEFT BREAST LUMPECTOMY WITH RADIOACTIVE SEED LOCALIZATION (Left Breast)  Patient Location: PACU  Anesthesia Type:General  Level of Consciousness: awake, alert , oriented and patient cooperative  Airway & Oxygen Therapy: Patient Spontanous Breathing and Patient connected to face mask oxygen  Post-op Assessment: Report given to RN, Post -op Vital signs reviewed and stable and Patient moving all extremities  Post vital signs: Reviewed and stable  Last Vitals:  Vitals Value Taken Time  BP    Temp 37 C 06/07/19 1225  Pulse 73 06/07/19 1228  Resp 24 06/07/19 1228  SpO2 100 % 06/07/19 1228  Vitals shown include unvalidated device data.  Last Pain:  Vitals:   06/07/19 0945  TempSrc: Tympanic  PainSc: 0-No pain         Complications: No apparent anesthesia complications

## 2019-06-08 ENCOUNTER — Encounter: Payer: Self-pay | Admitting: *Deleted

## 2019-06-08 LAB — SURGICAL PATHOLOGY

## 2019-06-08 NOTE — Anesthesia Postprocedure Evaluation (Signed)
Anesthesia Post Note  Patient: Sofee Mayne  Procedure(s) Performed: LEFT BREAST LUMPECTOMY WITH RADIOACTIVE SEED LOCALIZATION (Left Breast)     Patient location during evaluation: PACU Anesthesia Type: General Level of consciousness: awake and alert Pain management: pain level controlled Vital Signs Assessment: post-procedure vital signs reviewed and stable Respiratory status: spontaneous breathing, nonlabored ventilation, respiratory function stable and patient connected to nasal cannula oxygen Cardiovascular status: blood pressure returned to baseline and stable Postop Assessment: no apparent nausea or vomiting Anesthetic complications: no    Last Vitals:  Vitals:   06/07/19 1315 06/07/19 1348  BP: 108/66 124/71  Pulse: (!) 58 (!) 58  Resp: 18 16  Temp:  36.4 C  SpO2: 96% 96%    Last Pain:  Vitals:   06/07/19 1348  TempSrc:   PainSc: [place]

## 2019-06-09 ENCOUNTER — Telehealth: Payer: Self-pay | Admitting: Family Medicine

## 2019-06-09 NOTE — Telephone Encounter (Signed)
Requested records received from [facility] 

## 2019-06-17 ENCOUNTER — Encounter: Payer: Self-pay | Admitting: Internal Medicine

## 2019-06-28 DIAGNOSIS — R52 Pain, unspecified: Secondary | ICD-10-CM | POA: Diagnosis not present

## 2019-06-28 DIAGNOSIS — Z20822 Contact with and (suspected) exposure to covid-19: Secondary | ICD-10-CM | POA: Diagnosis not present

## 2019-06-28 DIAGNOSIS — R519 Headache, unspecified: Secondary | ICD-10-CM | POA: Diagnosis not present

## 2019-08-05 ENCOUNTER — Ambulatory Visit: Payer: BC Managed Care – PPO | Admitting: Family Medicine

## 2019-08-17 ENCOUNTER — Other Ambulatory Visit: Payer: Self-pay | Admitting: Family Medicine

## 2019-09-07 ENCOUNTER — Ambulatory Visit: Payer: BC Managed Care – PPO | Admitting: Dermatology

## 2019-09-07 ENCOUNTER — Encounter: Payer: Self-pay | Admitting: Dermatology

## 2019-09-07 ENCOUNTER — Ambulatory Visit (INDEPENDENT_AMBULATORY_CARE_PROVIDER_SITE_OTHER): Payer: BC Managed Care – PPO | Admitting: Dermatology

## 2019-09-07 ENCOUNTER — Other Ambulatory Visit: Payer: Self-pay

## 2019-09-07 DIAGNOSIS — B079 Viral wart, unspecified: Secondary | ICD-10-CM | POA: Diagnosis not present

## 2019-09-07 DIAGNOSIS — L816 Other disorders of diminished melanin formation: Secondary | ICD-10-CM

## 2019-09-07 DIAGNOSIS — L821 Other seborrheic keratosis: Secondary | ICD-10-CM | POA: Diagnosis not present

## 2019-09-07 DIAGNOSIS — D485 Neoplasm of uncertain behavior of skin: Secondary | ICD-10-CM

## 2019-09-07 NOTE — Patient Instructions (Signed)

## 2019-09-07 NOTE — Progress Notes (Addendum)
   New Patient   Subjective  [name] is a 54 y.o. female who presents for the following: Skin Problem (left preauricular- new growth - + itch).  Growth Location: Left cheek Duration: 6 months Quality: Larger Associated Signs/Symptoms: Modifying Factors:  Severity:  Timing: Context:    The following portions of the chart were reviewed this encounter and updated as appropriate: Tobacco  Allergies  Meds  Problems  Med Hx  Surg Hx  Fam Hx     Face: DPN, legs IGH Objective  Well appearing patient in no apparent distress; mood and affect are within normal limits.  A focused examination was performed including Head and neck. Relevant physical exam findings are noted in the Assessment and Plan.   Assessment & Plan  Neoplasm of uncertain behavior of skin Left Zygomatic Area  Skin / nail biopsy Type of biopsy: tangential   Informed consent: discussed and consent obtained   Timeout: patient name, date of birth, surgical site, and procedure verified   Procedure prep:  Patient was prepped and draped in usual sterile fashion Prep type:  Chlorhexidine Anesthesia: the lesion was anesthetized in a standard fashion   Anesthetic:  1% lidocaine w/ epinephrine 1-100,000 local infiltration Instrument used: flexible razor blade   Hemostasis achieved with: ferric subsulfate   Outcome: patient tolerated procedure well   Post-procedure details: sterile dressing applied and wound care instructions given   Dressing type: petrolatum   Additional details:  Patient identified lesion of concern.  Lesion identified by physician.  Specimen 1 - Surgical pathology Differential Diagnosis: r.o tag, sk Check Margins: No  Seborrheic keratosis (2) Left Malar Cheek; Right Malar Cheek  Removal not medically necessary

## 2019-09-12 ENCOUNTER — Encounter: Payer: Self-pay | Admitting: Dermatology

## 2019-11-07 ENCOUNTER — Ambulatory Visit (INDEPENDENT_AMBULATORY_CARE_PROVIDER_SITE_OTHER): Payer: BC Managed Care – PPO | Admitting: Family Medicine

## 2019-11-07 ENCOUNTER — Encounter: Payer: Self-pay | Admitting: Family Medicine

## 2019-11-07 ENCOUNTER — Other Ambulatory Visit: Payer: Self-pay

## 2019-11-07 VITALS — BP 120/60 | HR 66 | Temp 97.1°F | Wt 212.6 lb

## 2019-11-07 DIAGNOSIS — R2232 Localized swelling, mass and lump, left upper limb: Secondary | ICD-10-CM

## 2019-11-07 DIAGNOSIS — Z803 Family history of malignant neoplasm of breast: Secondary | ICD-10-CM

## 2019-11-07 NOTE — Progress Notes (Signed)
   Subjective:    Patient ID: [name], female    DOB: 1965/08/27, 54 y.o.   MRN: [id number]  HPI Chief Complaint  Patient presents with  . lump    lump under left arm. had tissues removed under arm due to high risk back in february   Here with complaints of a mass in her left axilla region. She noticed this last week. Denies tenderness or pain.   Recent breast biopsy of her left upper outer breast, benign finding. She is concerned about this being something bad.   No fever, chills, night sweats, unexplained weight loss, chest pain, shortness of breath, N/V/D.   Reviewed allergies, medications, past medical, surgical, family, and social history.    Review of Systems Pertinent positives and negatives in the history of present illness.     Objective:   Physical Exam Constitutional:      Appearance: Normal appearance.  Cardiovascular:     Rate and Rhythm: Normal rate and regular rhythm.     Pulses: Normal pulses.  Pulmonary:     Effort: Pulmonary effort is normal.     Breath sounds: Normal breath sounds.  Chest:     Chest wall: No tenderness.     Breasts:        Right: Normal.        Left: No swelling or tenderness.       Comments: Pea-sized firm, non tender, moveable mass in her left axilla. She has a scar from recent incision to her left upper outer breast.  Musculoskeletal:     Cervical back: Normal range of motion and neck supple.  Lymphadenopathy:     Cervical: No cervical adenopathy.     Upper Body:     Left upper body: No supraclavicular or axillary adenopathy.  Neurological:     Mental Status: She is alert.    BP 120/60   Pulse 66   Temp (!) 97.1 F (36.2 C)   Wt 212 lb 9.6 oz (96.4 kg)   LMP 10/08/2011   BMI 34.31 kg/m       Assessment & Plan:  Mass of left axilla - Plan: US BREAST LTD UNI LEFT INC AXILLA  Family history of breast cancer - Plan: US BREAST LTD UNI LEFT INC AXILLA  Reassured her that she has had a recent mammogram  and [place] of her left breast and axilla region with benign findings. We will get her scheduled for an US of the area of concern.

## 2019-11-18 ENCOUNTER — Ambulatory Visit
Admission: RE | Admit: 2019-11-18 | Discharge: 2019-11-18 | Disposition: A | Discharge status: Home / Self Care | Payer: BC Managed Care – PPO | Source: Ambulatory Visit | Attending: Family Medicine | Admitting: Family Medicine

## 2019-11-18 ENCOUNTER — Other Ambulatory Visit: Payer: Self-pay

## 2019-11-18 DIAGNOSIS — R2232 Localized swelling, mass and lump, left upper limb: Secondary | ICD-10-CM

## 2019-11-18 DIAGNOSIS — N6489 Other specified disorders of breast: Secondary | ICD-10-CM | POA: Diagnosis not present

## 2019-11-18 DIAGNOSIS — Z803 Family history of malignant neoplasm of breast: Secondary | ICD-10-CM

## 2019-11-27 ENCOUNTER — Other Ambulatory Visit: Payer: Self-pay | Admitting: Family Medicine

## 2019-12-01 DIAGNOSIS — Z6834 Body mass index (BMI) 34.0-34.9, adult: Secondary | ICD-10-CM | POA: Diagnosis not present

## 2019-12-01 DIAGNOSIS — Z124 Encounter for screening for malignant neoplasm of cervix: Secondary | ICD-10-CM | POA: Diagnosis not present

## 2019-12-01 DIAGNOSIS — R102 Pelvic and perineal pain: Secondary | ICD-10-CM | POA: Diagnosis not present

## 2019-12-01 DIAGNOSIS — Z01419 Encounter for gynecological examination (general) (routine) without abnormal findings: Secondary | ICD-10-CM | POA: Diagnosis not present

## 2019-12-01 LAB — HM PAP SMEAR: HM Pap smear: NEGATIVE

## 2019-12-03 ENCOUNTER — Ambulatory Visit: Payer: BC Managed Care – PPO | Attending: Family

## 2019-12-03 DIAGNOSIS — Z23 Encounter for immunization: Secondary | ICD-10-CM

## 2019-12-03 NOTE — Progress Notes (Signed)
   Covid-19 Vaccination Clinic  Name:  [name]    MRN: [id number] DOB: 04-29-1966  12/03/2019  [name] was observed post Covid-19 immunization for 15 minutes without incident. She was provided with Vaccine Information Sheet and instruction to access the V-Safe system.   [name] was instructed to call 911 with any severe reactions post vaccine: Marland Kitchen Difficulty breathing  . Swelling of face and throat  . A fast heartbeat  . A bad rash all over body  . Dizziness and weakness   Immunizations Administered    Name Date Dose VIS Date Route   Pfizer COVID-19 Vaccine 12/03/2019  6:46 PM 0.3 mL 07/06/2018 Intramuscular   Manufacturer: [employer], [employer]   Lot: [id number]   [place]: [id number]

## 2019-12-12 ENCOUNTER — Encounter: Payer: Self-pay | Admitting: Internal Medicine

## 2020-01-03 ENCOUNTER — Ambulatory Visit: Payer: BC Managed Care – PPO | Attending: Internal Medicine

## 2020-01-03 DIAGNOSIS — Z23 Encounter for immunization: Secondary | ICD-10-CM

## 2020-01-03 NOTE — Progress Notes (Signed)
° °  Covid-19 Vaccination Clinic  Name:  [name]    MRN: [id number] DOB: [birth year]  01/03/2020  [name] was observed post Covid-19 immunization for 15 minutes without incident. She was provided with Vaccine Information Sheet and instruction to access the V-Safe system.   [name] was instructed to call 911 with any severe reactions post vaccine:  Difficulty breathing   Swelling of face and throat   A fast heartbeat   A bad rash all over body   Dizziness and weakness   Immunizations Administered    Name Date Dose VIS Date Route   Pfizer COVID-19 Vaccine 01/03/2020  1:12 PM 0.3 mL 07/06/2018 Intramuscular   Manufacturer: [place]   Lot: [place]   [place]: [id number]

## 2020-02-29 ENCOUNTER — Other Ambulatory Visit: Payer: Self-pay | Admitting: Family Medicine

## 2020-03-19 ENCOUNTER — Other Ambulatory Visit: Payer: Self-pay

## 2020-03-19 ENCOUNTER — Ambulatory Visit (INDEPENDENT_AMBULATORY_CARE_PROVIDER_SITE_OTHER): Payer: BC Managed Care – PPO | Admitting: Family Medicine

## 2020-03-19 ENCOUNTER — Encounter: Payer: Self-pay | Admitting: Family Medicine

## 2020-03-19 VITALS — BP 118/70 | HR 64 | Ht 67.0 in | Wt 212.2 lb

## 2020-03-19 DIAGNOSIS — M7918 Myalgia, other site: Secondary | ICD-10-CM | POA: Diagnosis not present

## 2020-03-19 MED ORDER — METHOCARBAMOL 500 MG PO TABS: 500.0000 mg | ORAL_TABLET | Freq: Three times a day (TID) | ORAL | 0 refills | Status: DC | PRN

## 2020-03-19 MED ORDER — CYCLOBENZAPRINE HCL 10 MG PO TABS: 5.0000 mg | ORAL_TABLET | Freq: Every evening | ORAL | 0 refills | Status: DC | PRN

## 2020-03-19 NOTE — Progress Notes (Signed)
Subjective:    Patient ID: [name], female    DOB: 1966-04-14, 54 y.o.   MRN: [id number]  Chief Complaint  Patient presents with  . Back Pain    upper and mid back pain x 2 weeks that worsened this weekend. Had some neck pain this weekend as well-all of the pain is on the right side. If she sits too long, the pain tends to radiate down the middle of her back to her mid to lower back.   . Flu Vaccine    declined.   HPI   Patient [name] is a 54 year old female here for evaluation of sudden onset right sided thoracic back pain that has become progressively worse over the last 2 weeks. Describes pain as a dull ache and rates it at an 8.5/10 at it's worst. Pain radiates to mid back when sitting and intermittently radiates to right neck. 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 Extra-strength tylenol and Voltaren gel relieved pain until 2 days ago. Pain is affecting patient's sleep. She was seen in April 2020 for low back pain but reports that this is different.   Review of Systems  Constitutional: Negative for chills, fatigue, fever and unexpected weight change.  Respiratory: Negative for chest tightness and shortness of breath.        Denies pain with breathing  Cardiovascular: Negative for chest pain and palpitations.  Gastrointestinal: Negative for abdominal distention, abdominal pain, constipation, nausea and vomiting.  Genitourinary: Negative for difficulty urinating and flank pain.  Musculoskeletal: Positive for back pain, myalgias and neck pain. Negative for gait problem and joint swelling.  Neurological: Negative for dizziness, weakness, numbness and headaches.   PMH, PSH, SH reviewed    Objective:   Physical Exam Constitutional:      General: She is not in acute distress.    Appearance: Normal appearance.  Cardiovascular:     Rate and Rhythm: Normal rate and regular rhythm.     Heart sounds: Normal heart  sounds. No murmur heard.  No friction rub. No gallop.   Pulmonary:     Effort: Pulmonary effort is normal. No respiratory distress.     Breath sounds: Normal breath sounds.  Chest:     Chest wall: No tenderness.  Abdominal:     General: There is no distension.     Palpations: Abdomen is soft. There is no mass.     Tenderness: There is no abdominal tenderness. There is no right CVA tenderness or left CVA tenderness.  Musculoskeletal:        General: No swelling, deformity or signs of injury.     Cervical back: No edema, signs of trauma, bony tenderness or crepitus. Muscular tenderness present. Normal range of motion.     Thoracic back: Tenderness present. No swelling, edema, deformity, signs of trauma or bony tenderness. Normal range of motion.     Lumbar back: Normal.     Comments: Right sided rhomboid muscle tenderness and mild right sided trapezius muscle tenderness  Neurological:     General: No focal deficit present.     Mental Status: She is alert and oriented to person, place, and time. Mental status is at baseline.     Sensory: No sensory deficit.     Motor: No weakness.     Coordination: Coordination normal.     Gait: Gait is intact.     Deep Tendon Reflexes: Reflexes are normal and symmetric.     BP 118/70  Pulse 64   Ht 5\' 7"  (1.702 m)   Wt 212 lb 3.2 oz (96.3 kg)   LMP 10/08/2011   BMI 33.24 kg/m      Assessment & Plan:   Patient's pain is likely due to right rhomboid muscle strain. Thoracic radiculopathy was considered as a diagnosis but is not likely.   Patient instructed to do stretches as shown and massage after using heat.  Apply heat to area 2-3 times a day..  Continue using extra strength tylenol and Voltaren gel as needed.   Use muscle relaxer Flexeril 10mg  as needed for muscle spasm at bedtime and Robaxin 500mg  as needed for muscle spasm during the day.  - Patient instructed not to drink alcohol while taking these medications  Follow-up if no  improvement in 1-2 weeks for further treatment with anti-inflammatory combined with acid reducer like Pprilosec or Pepcid and physical therapy referral.

## 2020-03-19 NOTE — Progress Notes (Signed)
Chief Complaint  Patient presents with   Back Pain    upper and mid back pain x 2 weeks that worsened this weekend. Had some neck pain this weekend as well-all of the pain is on the right side. If she sits too long, the pain tends to radiate down the middle of her back to her mid to lower back.    Flu Vaccine    declined.   She started with R sided thoracic back pain x 2 weeks.  Radiates down the back only when seated (relieved by standing), sometimes radiates up to the neck.  Cannot tolerate aspirin or ibuprofen (h/o ulcer, GI side effects) Tylenol and Voltaren gel had been helping until 2 days ago.  She works in a daycare.  No known injury or change in activity.  Onset of pain was acute, with no identified cause.  See FNP student's note for more detailed history, reviewed by me.  PMH, PSH, SH reviewed  Outpatient Encounter Medications as of 03/19/2020  Medication Sig   ELDERBERRY PO Take by mouth.   Multiple Vitamin (MULTIVITAMIN) capsule Take 1 capsule by mouth daily.   simvastatin (ZOCOR) 40 MG tablet TAKE 1 TABLET BY MOUTH EVERYDAY AT BEDTIME   vitamin B-12 (CYANOCOBALAMIN) 100 MCG tablet Take 100 mcg by mouth daily.   [DISCONTINUED] APPLE CIDER VINEGAR PO Take by mouth.   acetaminophen (TYLENOL) 500 MG tablet Take 1,000 mg as needed by mouth for mild pain, moderate pain, fever or headache. (Patient not taking: Reported on 03/19/2020)   cyclobenzaprine (FLEXERIL) 10 MG tablet Take 0.5-1 tablets (5-10 mg total) by mouth at bedtime as needed for muscle spasms.   methocarbamol (ROBAXIN) 500 MG tablet Take 1-2 tablets (500-1,000 mg total) by mouth every 8 (eight) hours as needed for muscle spasms.   No facility-administered encounter medications on file as of 03/19/2020.   NOT taking flexeril or robaxin prior to today's visit.  Allergies  Allergen Reactions   Aspirin Nausea Only and Other (See Comments)    Stomach cramping   Ibuprofen     Pt stated, "Upsets stomach  while I am on my cholesterol medicine"    ROS:  No fever, chills, headaches, URI symptoms, numbness, tingling, weakness, chest pain, shortness of breath, GI complaints, bleeding, bruising, rash.  +neck pain per HPI. Moods are good.   PHYSICAL EXAM:  BP 118/70    Pulse 64    Ht 5\' 7"  (1.702 m)    Wt 212 lb 3.2 oz (96.3 kg)    LMP 10/08/2011    BMI 33.24 kg/m   Well-appearing, pleasant female, in mild discomfort due to pain. HEENT: conjunctiva and sclera are clear, EOMI, wearing mask Neck: no lymphadenopathy, thyromegaly or mass. No cervical motion tenderness. She is mildly tender at R trapezius at shoulder. She is tender at R rhomboid muscle. No significant spasm appreciable on exam Back: no spinal tenderness or CVA tenderness Heart: regular rate and rhythm Lungs: clear bilaterally Neuro: alert and oriented, normal strength, DTR's gait.  ASSESSMENT/PLAN:  Rhomboid muscle pain - risks/SE of muscle relaxants reviewed. Heat, massage. Stretches shown. Cont topical voltaren gel and tyl prn. PT if not improving (can consider NSAID) - Plan: methocarbamol (ROBAXIN) 500 MG tablet, cyclobenzaprine (FLEXERIL) 10 MG tablet   Please apply heat to the area at least 2-3 times/day. Do the stretches as shown (and massage, if possible) after the heat. Continue to use Voltaren gel, and extra strength tylenol as needed.  If you aren't improving with these measures,  next steps include trial of an anti-inflammatory (along with something to protect your stomach, short-term, like prilosec OTC or pepcid), and/or referral for physical therapy. Contact us within the week or two if you aren't improving and need additional treatment.

## 2020-03-19 NOTE — Patient Instructions (Signed)
Please apply heat to the area at least 2-3 times/day. Do the stretches as shown (and massage, if possible) after the heat. Continue to use Voltaren gel, and extra strength tylenol as needed.  If you aren't improving with these measures, next steps include trial of an anti-inflammatory (along with something to protect your stomach, short-term, like prilosec OTC or pepcid), and/or referral for physical therapy. Contact us within the week or two if you aren't improving and need additional treatment.

## 2020-05-03 ENCOUNTER — Other Ambulatory Visit: Payer: Self-pay | Admitting: Surgery

## 2020-05-03 DIAGNOSIS — Z1231 Encounter for screening mammogram for malignant neoplasm of breast: Secondary | ICD-10-CM

## 2020-06-03 ENCOUNTER — Other Ambulatory Visit: Payer: Self-pay | Admitting: Family Medicine

## 2020-06-04 ENCOUNTER — Ambulatory Visit: Payer: BC Managed Care – PPO | Admitting: Dermatology

## 2020-06-04 NOTE — Telephone Encounter (Signed)
Pt thinks she has enough to last her til her appt

## 2020-06-12 ENCOUNTER — Ambulatory Visit: Payer: BC Managed Care – PPO

## 2020-06-29 DIAGNOSIS — Z9189 Other specified personal risk factors, not elsewhere classified: Secondary | ICD-10-CM | POA: Diagnosis not present

## 2020-06-29 DIAGNOSIS — N6092 Unspecified benign mammary dysplasia of left breast: Secondary | ICD-10-CM | POA: Diagnosis not present

## 2020-07-12 ENCOUNTER — Encounter: Payer: BC Managed Care – PPO | Admitting: Family Medicine

## 2020-07-25 ENCOUNTER — Ambulatory Visit: Payer: BC Managed Care – PPO | Admitting: Dermatology

## 2020-07-26 ENCOUNTER — Other Ambulatory Visit: Payer: Self-pay

## 2020-07-26 ENCOUNTER — Ambulatory Visit
Admission: RE | Admit: 2020-07-26 | Discharge: 2020-07-26 | Disposition: A | Discharge status: Home / Self Care | Payer: BC Managed Care – PPO | Source: Ambulatory Visit | Attending: Surgery | Admitting: Surgery

## 2020-07-26 DIAGNOSIS — Z1231 Encounter for screening mammogram for malignant neoplasm of breast: Secondary | ICD-10-CM

## 2020-09-04 ENCOUNTER — Encounter: Payer: Self-pay | Admitting: Family Medicine

## 2020-09-04 ENCOUNTER — Other Ambulatory Visit: Payer: Self-pay

## 2020-09-04 ENCOUNTER — Ambulatory Visit (INDEPENDENT_AMBULATORY_CARE_PROVIDER_SITE_OTHER): Payer: BC Managed Care – PPO | Admitting: Family Medicine

## 2020-09-04 VITALS — BP 120/70 | HR 60 | Ht 67.0 in | Wt 207.6 lb

## 2020-09-04 DIAGNOSIS — E782 Mixed hyperlipidemia: Secondary | ICD-10-CM | POA: Diagnosis not present

## 2020-09-04 DIAGNOSIS — Z1329 Encounter for screening for other suspected endocrine disorder: Secondary | ICD-10-CM

## 2020-09-04 DIAGNOSIS — R7303 Prediabetes: Secondary | ICD-10-CM | POA: Diagnosis not present

## 2020-09-04 DIAGNOSIS — Z0000 Encounter for general adult medical examination without abnormal findings: Secondary | ICD-10-CM

## 2020-09-04 DIAGNOSIS — M7918 Myalgia, other site: Secondary | ICD-10-CM | POA: Diagnosis not present

## 2020-09-04 DIAGNOSIS — Z87891 Personal history of nicotine dependence: Secondary | ICD-10-CM | POA: Diagnosis not present

## 2020-09-04 DIAGNOSIS — Z86010 Personal history of colonic polyps: Secondary | ICD-10-CM

## 2020-09-04 MED ORDER — CYCLOBENZAPRINE HCL 10 MG PO TABS: 5.0000 mg | ORAL_TABLET | Freq: Every evening | ORAL | 0 refills | Status: DC | PRN

## 2020-09-04 MED ORDER — METHOCARBAMOL 500 MG PO TABS: 500.0000 mg | ORAL_TABLET | Freq: Three times a day (TID) | ORAL | 0 refills | Status: DC | PRN

## 2020-09-04 NOTE — Patient Instructions (Signed)
Preventive Care 84-57 Years Old, Female Preventive care refers to lifestyle choices and visits with your health care provider that can promote health and wellness. This includes:  A yearly physical exam. This is also called an annual wellness visit.  Regular dental and eye exams.  Immunizations.  Screening for certain conditions.  Healthy lifestyle choices, such as: ? Eating a healthy diet. ? Getting regular exercise. ? Not using drugs or products that contain nicotine and tobacco. ? Limiting alcohol use. What can I expect for my preventive care visit? Physical exam Your health care provider will check your:  Height and weight. These may be used to calculate your BMI (body mass index). BMI is a measurement that tells if you are at a healthy weight.  Heart rate and blood pressure.  Body temperature.  Skin for abnormal spots. Counseling Your health care provider may ask you questions about your:  Past medical problems.  Family's medical history.  Alcohol, tobacco, and drug use.  Emotional well-being.  Home life and relationship well-being.  Sexual activity.  Diet, exercise, and sleep habits.  Work and work Statistician.  Access to firearms.  Method of birth control.  Menstrual cycle.  Pregnancy history. What immunizations do I need? Vaccines are usually given at various ages, according to a schedule. Your health care provider will recommend vaccines for you based on your age, medical history, and lifestyle or other factors, such as travel or where you work.   What tests do I need? Blood tests  Lipid and cholesterol levels. These may be checked every 5 years, or more often if you are over 3 years old.  Hepatitis C test.  Hepatitis B test. Screening  Lung cancer screening. You may have this screening every year starting at age 73 if you have a 30-pack-year history of smoking and currently smoke or have quit within the past 15 years.  Colorectal cancer  screening. ? All adults should have this screening starting at age 52 and continuing until age 17. ? Your health care provider may recommend screening at age 49 if you are at increased risk. ? You will have tests every 1-10 years, depending on your results and the type of screening test.  Diabetes screening. ? This is done by checking your blood sugar (glucose) after you have not eaten for a while (fasting). ? You may have this done every 1-3 years.  Mammogram. ? This may be done every 1-2 years. ? Talk with your health care provider about when you should start having regular mammograms. This may depend on whether you have a family history of breast cancer.  BRCA-related cancer screening. This may be done if you have a family history of breast, ovarian, tubal, or peritoneal cancers.  Pelvic exam and Pap test. ? This may be done every 3 years starting at age 10. ? Starting at age 11, this may be done every 5 years if you have a Pap test in combination with an HPV test. Other tests  STD (sexually transmitted disease) testing, if you are at risk.  Bone density scan. This is done to screen for osteoporosis. You may have this scan if you are at high risk for osteoporosis. Talk with your health care provider about your test results, treatment options, and if necessary, the need for more tests. Follow these instructions at home: Eating and drinking  Eat a diet that includes fresh fruits and vegetables, whole grains, lean protein, and low-fat dairy products.  Take vitamin and mineral supplements  as recommended by your health care provider.  Do not drink alcohol if: ? Your health care provider tells you not to drink. ? You are pregnant, may be pregnant, or are planning to become pregnant.  If you drink alcohol: ? Limit how much you have to 0-1 drink a day. ? Be aware of how much alcohol is in your drink. In the [place] one drink equals one 12 oz bottle of beer (355 mL), one 5 oz glass of  wine (148 mL), or one 1 oz glass of hard liquor (44 mL).   Lifestyle  Take daily care of your teeth and gums. Brush your teeth every morning and night with fluoride toothpaste. Floss one time each day.  Stay active. Exercise for at least 30 minutes 5 or more days each week.  Do not use any products that contain nicotine or tobacco, such as cigarettes, e-cigarettes, and chewing tobacco. If you need help quitting, ask your health care provider.  Do not use drugs.  If you are sexually active, practice safe sex. Use a condom or other form of protection to prevent STIs (sexually transmitted infections).  If you do not wish to become pregnant, use a form of birth control. If you plan to become pregnant, see your health care provider for a prepregnancy visit.  If told by your health care provider, take low-dose aspirin daily starting at age 50.  Find healthy ways to cope with stress, such as: ? Meditation, yoga, or listening to music. ? Journaling. ? Talking to a trusted person. ? Spending time with friends and family. Safety  Always wear your seat belt while driving or riding in a vehicle.  Do not drive: ? If you have been drinking alcohol. Do not ride with someone who has been drinking. ? When you are tired or distracted. ? While texting.  Wear a helmet and other protective equipment during sports activities.  If you have firearms in your house, make sure you follow all gun safety procedures. What's next?  Visit your health care provider once a year for an annual wellness visit.  Ask your health care provider how often you should have your eyes and teeth checked.  Stay up to date on all vaccines. This information is not intended to replace advice given to you by your health care provider. Make sure you discuss any questions you have with your health care provider. Document Revised: 01/31/2020 Document Reviewed: 01/07/2018 Elsevier Patient Education  2021 Elsevier Inc.  

## 2020-09-04 NOTE — Progress Notes (Signed)
Subjective:    Patient ID: [name], female    DOB: [birth year], 55 y.o.   MRN: [id number]  HPI Chief Complaint  Patient presents with  . cpe    Fasting cpe, pap done recently   She is here for a complete physical exam. States she is doing well.    Other providers: OB/GYN- Dr. Charlesetta Garibaldi   HL- taking simvastatin 40 mg daily and would like to be able to stop this medication if possible.    Stopped smoking 2 years ago.  Diet: fairly healthy. Using air fryer  Excerise: active lifestyle   Immunizations: Tdap 4 years ago at [facility]  Covid   Health maintenance:  Mammogram: 07/2020 Colonoscopy: 04/2017 and due for recall in 2023 Last Gynecological Exam: in 2021  Last Dental Exam: 2022 Last Eye Exam: 2021   Wears seatbelt always, smoke detectors in home and functioning, does not text while driving and feels safe in home environment.   Reviewed allergies, medications, past medical, surgical, family, and social history.     Review of Systems Review of Systems Constitutional: -fever, -chills, -sweats, -unexpected weight change,-fatigue ENT: -runny nose, -ear pain, -sore throat Cardiology:  -chest pain, -palpitations, -edema Respiratory: -cough, -shortness of breath, -wheezing Gastroenterology: -abdominal pain, -nausea, -vomiting, -diarrhea, -constipation  Hematology: -bleeding or bruising problems Musculoskeletal: -arthralgias, -myalgias, -joint swelling, -back pain Ophthalmology: -vision changes Urology: -dysuria, -difficulty urinating, -hematuria, -urinary frequency, -urgency Neurology: -headache, -weakness, -tingling, -numbness       Objective:   Physical Exam BP 120/70   Pulse 60   Ht 5\' 7"  (1.702 m)   Wt 207 lb 9.6 oz (94.2 kg)   LMP 10/08/2011   BMI 32.51 kg/m   General Appearance:    Alert, cooperative, no distress, appears stated age  Head:    Normocephalic, without obvious abnormality, atraumatic  Eyes:    PERRL, conjunctiva/corneas  clear, EOM's intact  Ears:    Normal TM's and external ear canals  Nose:   Mask on   Throat:   Mask on   Neck:   Supple, no lymphadenopathy;  thyroid:  no   enlargement/tenderness/nodules; no JVD  Back:    Spine nontender, no curvature, ROM normal, no CVA     tenderness  Lungs:     Clear to auscultation bilaterally without wheezes, rales or     ronchi; respirations unlabored  Chest Wall:    No tenderness or deformity   Heart:    Regular rate and rhythm, S1 and S2 normal, no murmur, rub   or gallop  Breast Exam:    OB/GYN  Abdomen:     Soft, non-tender, nondistended, normoactive bowel sounds,    no masses, no hepatosplenomegaly  Genitalia:    OB/GYN      Extremities:   No clubbing, cyanosis or edema  Pulses:   2+ and symmetric all extremities  Skin:   Skin color, texture, turgor normal, no rashes or lesions  Lymph nodes:   Cervical, supraclavicular, and axillary nodes normal  Neurologic:   CNII-XII intact, normal strength, sensation and gait          Psych:   Normal mood, affect, hygiene and grooming.          Assessment & Plan:  Routine general medical examination at a health care facility - Plan: Comprehensive metabolic panel, CBC with Differential/Platelet, TSH, T4, free She is here for fasting CPE.  Preventive health care reviewed and she does see OB/GYN.  Mammogram up-to-date.  She  will be due for colonoscopy in 2023.  Counseling on healthy lifestyle including diet and exercise.  Congratulated her on stopping smoking 2 years ago.  Recommend regular dental and eye exams.  Discussed safety.  Immunizations reviewed.  She declines a COVID booster today.  Declines Shingrix.  Prediabetes - Plan: Comprehensive metabolic panel, Hemoglobin A1c -Recommend low sugar, low carbohydrate diet and getting plenty of physical activity.  Check A1c and follow-up  Mixed hyperlipidemia - Plan: Lipid panel -She would like to stop taking the statin eventually.  We will check lipid panel and see if we  can reduce the dose based on her risk factors for heart disease.  Hx of adenomatous colonic polyps -Asymptomatic.  She will be due for colonoscopy recall next year.  Rhomboid muscle pain - risks/SE of muscle relaxants reviewed. Heat, massage. Stretches shown. Cont topical voltaren gel and tyl prn. PT if not improving (can consider NSAID) - Plan: methocarbamol (ROBAXIN) 500 MG tablet, cyclobenzaprine (FLEXERIL) 10 MG tablet -I will refill her medication for the intermittent muscle pain.  Discussed if she continues needing the medication then we should consider a referral to Ortho or physical therapy.  Screening for thyroid disorder - Plan: TSH, T4, free  Former smoker -Congratulated her on being stopped smoking for 2 years.

## 2020-09-05 LAB — CBC WITH DIFFERENTIAL/PLATELET
Basophils Absolute: 0 10*3/uL (ref 0.0–0.2)
Basos: 0 %
EOS (ABSOLUTE): 0.1 10*3/uL (ref 0.0–0.4)
Eos: 2 %
Hematocrit: 40 % (ref 34.0–46.6)
Hemoglobin: 13.1 g/dL (ref 11.1–15.9)
Immature Grans (Abs): 0 10*3/uL (ref 0.0–0.1)
Immature Granulocytes: 0 %
Lymphocytes Absolute: 3.7 10*3/uL — ABNORMAL HIGH (ref 0.7–3.1)
Lymphs: 50 %
MCH: 27.4 pg (ref 26.6–33.0)
MCHC: 32.8 g/dL (ref 31.5–35.7)
MCV: 84 fL (ref 79–97)
Monocytes Absolute: 0.5 10*3/uL (ref 0.1–0.9)
Monocytes: 7 %
Neutrophils Absolute: 3 10*3/uL (ref 1.4–7.0)
Neutrophils: 41 %
Platelets: 270 10*3/uL (ref 150–450)
RBC: 4.78 x10E6/uL (ref 3.77–5.28)
RDW: 11.9 % (ref 11.7–15.4)
WBC: 7.4 10*3/uL (ref 3.4–10.8)

## 2020-09-05 LAB — LIPID PANEL
Chol/HDL Ratio: 3.3 ratio (ref 0.0–4.4)
Cholesterol, Total: 180 mg/dL (ref 100–199)
HDL: 54 mg/dL (ref >39)
LDL Chol Calc (NIH): 113 mg/dL — ABNORMAL HIGH (ref 0–99)
Triglycerides: 66 mg/dL (ref 0–149)
VLDL Cholesterol Cal: 13 mg/dL (ref 5–40)

## 2020-09-05 LAB — COMPREHENSIVE METABOLIC PANEL
ALT: 21 IU/L (ref 0–32)
AST: 18 IU/L (ref 0–40)
Albumin/Globulin Ratio: 1.7 (ref 1.2–2.2)
Albumin: 4.4 g/dL (ref 3.8–4.9)
Alkaline Phosphatase: 73 IU/L (ref 44–121)
BUN/Creatinine Ratio: 17 (ref 9–23)
BUN: 13 mg/dL (ref 6–24)
Bilirubin Total: 0.5 mg/dL (ref 0.0–1.2)
CO2: 24 mmol/L (ref 20–29)
Calcium: 9.2 mg/dL (ref 8.7–10.2)
Chloride: 102 mmol/L (ref 96–106)
Creatinine, Ser: 0.76 mg/dL (ref 0.57–1.00)
Globulin, Total: 2.6 g/dL (ref 1.5–4.5)
Glucose: 79 mg/dL (ref 65–99)
Potassium: 3.8 mmol/L (ref 3.5–5.2)
Sodium: 139 mmol/L (ref 134–144)
Total Protein: 7 g/dL (ref 6.0–8.5)
eGFR: 92 mL/min/{1.73_m2} (ref >59)

## 2020-09-05 LAB — HEMOGLOBIN A1C
Est. average glucose Bld gHb Est-mCnc: 123 mg/dL
Hgb A1c MFr Bld: 5.9 % — ABNORMAL HIGH (ref 4.8–5.6)

## 2020-09-05 LAB — TSH: TSH: 0.975 u[IU]/mL (ref 0.450–4.500)

## 2020-09-05 LAB — T4, FREE: Free T4: 1.19 ng/dL (ref 0.82–1.77)

## 2020-09-05 NOTE — Progress Notes (Signed)
Please refill simvastatin for 6 months and she will need to have fasting lipids at that time.

## 2020-09-06 ENCOUNTER — Other Ambulatory Visit: Payer: Self-pay | Admitting: Internal Medicine

## 2020-09-06 MED ORDER — SIMVASTATIN 40 MG PO TABS: ORAL_TABLET | ORAL | 1 refills | Status: DC

## 2020-10-31 ENCOUNTER — Ambulatory Visit (INDEPENDENT_AMBULATORY_CARE_PROVIDER_SITE_OTHER): Payer: BC Managed Care – PPO | Admitting: Family Medicine

## 2020-10-31 ENCOUNTER — Other Ambulatory Visit: Payer: Self-pay

## 2020-10-31 VITALS — BP 110/70 | HR 71 | Temp 97.4°F | Wt 206.8 lb

## 2020-10-31 DIAGNOSIS — M722 Plantar fascial fibromatosis: Secondary | ICD-10-CM

## 2020-10-31 MED ORDER — DICLOFENAC SODIUM 75 MG PO TBEC: 75.0000 mg | DELAYED_RELEASE_TABLET | Freq: Two times a day (BID) | ORAL | 0 refills | Status: DC

## 2020-10-31 NOTE — Patient Instructions (Signed)
Plantar Fasciitis  Plantar fasciitis is a painful foot condition that affects the heel. It occurs when the band of tissue that connects the toes to the heel bone (plantar fascia) becomes irritated. This can happen as the result of exercising too much or doing other repetitive activities (overuse injury). Plantar fasciitis can cause mild irritation to severe pain that makes it difficult to walk or move. The pain is usually worse in the morning after sleeping, or after sitting or lying down for a period of time. Pain may also beworse after long periods of walking or standing. What are the causes? This condition may be caused by: Standing for long periods of time. Wearing shoes that do not have good arch support. Doing activities that put stress on joints (high-impact activities). This includes ballet and exercise that makes your heart beat faster (aerobic exercise), such as running. Being overweight. An abnormal way of walking (gait). Tight muscles in the back of your lower leg (calf). High arches in your feet or flat feet. Starting a new athletic activity. What are the signs or symptoms? The main symptom of this condition is heel pain. Pain may get worse after the following: Taking the first steps after a time of rest, especially in the morning after awakening, or after you have been sitting or lying down for a while. Long periods of standing still. Pain may decrease after 30-45 minutes of activity, such as gentle walking. How is this diagnosed? This condition may be diagnosed based on your medical history, a physical exam, and your symptoms. Your health care provider will check for: A tender area on the bottom of your foot. A high arch in your foot or flat feet. Pain when you move your foot. Difficulty moving your foot. You may have imaging tests to confirm the diagnosis, such as: X-rays. Ultrasound. MRI. How is this treated? Treatment for plantar fasciitis depends on how severe your  condition is. Treatment may include: Rest, ice, pressure (compression), and raising (elevating) the affected foot. This is called RICE therapy. Your health care provider may recommend RICE therapy along with over-the-counter pain medicines to manage your pain. Exercises to stretch your calves and your plantar fascia. A splint that holds your foot in a stretched, upward position while you sleep (night splint). Physical therapy to relieve symptoms and prevent problems in the future. Injections of steroid medicine (cortisone) to relieve pain and inflammation. Stimulating your plantar fascia with electrical impulses (extracorporeal shock wave therapy). This is usually the last treatment option before surgery. Surgery, if other treatments have not worked after 12 months. Follow these instructions at home: Managing pain, stiffness, and swelling  If directed, put ice on the painful area. To do this: Put ice in a plastic bag, or use a frozen bottle of water. Place a towel between your skin and the bag or bottle. Roll the bottom of your foot over the bag or bottle. Do this for 20 minutes, 2-3 times a day. Wear athletic shoes that have air-sole or gel-sole cushions, or try soft shoe inserts that are designed for plantar fasciitis. Elevate your foot above the level of your heart while you are sitting or lying down.  Activity Avoid activities that cause pain. Ask your health care provider what activities are safe for you. Do physical therapy exercises and stretches as told by your health care provider. Try activities and forms of exercise that are easier on your joints (low impact). Examples include swimming, water aerobics, and biking. General instructions Take over-the-counter   and prescription medicines only as told by your health care provider. Wear a night splint while sleeping, if told by your health care provider. Loosen the splint if your toes tingle, become numb, or turn cold and blue. Maintain  a healthy weight, or work with your health care provider to lose weight as needed. Keep all follow-up visits. This is important. Contact a health care provider if you have: Symptoms that do not go away with home treatment. Pain that gets worse. Pain that affects your ability to move or do daily activities. Summary Plantar fasciitis is a painful foot condition that affects the heel. It occurs when the band of tissue that connects the toes to the heel bone (plantar fascia) becomes irritated. Heel pain is the main symptom of this condition. It may get worse after exercising too much or standing still for a long time. Treatment varies, but it usually starts with rest, ice, pressure (compression), and raising (elevating) the affected foot. This is called RICE therapy. Over-the-counter medicines can also be used to manage pain. This information is not intended to replace advice given to you by your health care provider. Make sure you discuss any questions you have with your healthcare provider. Document Revised: 08/15/2019 Document Reviewed: 08/15/2019 Elsevier Patient Education  2022 Elsevier Inc.  

## 2020-10-31 NOTE — Progress Notes (Signed)
   Subjective:    Patient ID: [name], female    DOB: 19-Nov-1965, 55 y.o.   MRN: [id number]  HPI She complains of a several month history of left heel pain.  No history of injury or overuse.  She has tried NSAIDs in the past but finds that some of them cause stomach irritation.  No other joints are involved.  The pain usually is worse when she gets up in the morning or if she sits for long periods of time and then gets up, she will feel the heel pain.   Review of Systems     Objective:   Physical Exam Exam of the left foot and ankle shows normal motion.  Slight tenderness over the calcaneal spur and to a lesser extent along the lateral heel.  No laxity noted.  Normal sensation.       Assessment & Plan:  Plantar fasciitis of left foot - Plan: diclofenac (VOLTAREN) 75 MG EC tablet Instructions given for proper care of the foot in terms of stretching and demonstrated it to her.  During also recommend arch supports and the possibility of using heel cups as well.  Explained that this would take several weeks before she would notice any improvement.  I will try her on Voltaren and she will call if she has difficulty.  Over 30 minutes spent in history, exam and demonstrating physical therapy to her.

## 2020-12-06 ENCOUNTER — Ambulatory Visit (INDEPENDENT_AMBULATORY_CARE_PROVIDER_SITE_OTHER): Payer: BC Managed Care – PPO | Admitting: Sports Medicine

## 2020-12-06 ENCOUNTER — Ambulatory Visit (INDEPENDENT_AMBULATORY_CARE_PROVIDER_SITE_OTHER): Payer: BC Managed Care – PPO

## 2020-12-06 ENCOUNTER — Other Ambulatory Visit: Payer: Self-pay

## 2020-12-06 ENCOUNTER — Encounter: Payer: Self-pay | Admitting: Sports Medicine

## 2020-12-06 ENCOUNTER — Other Ambulatory Visit: Payer: Self-pay | Admitting: Sports Medicine

## 2020-12-06 DIAGNOSIS — M79672 Pain in left foot: Secondary | ICD-10-CM | POA: Diagnosis not present

## 2020-12-06 DIAGNOSIS — M722 Plantar fascial fibromatosis: Secondary | ICD-10-CM

## 2020-12-06 MED ORDER — TRIAMCINOLONE ACETONIDE 10 MG/ML IJ SUSP
10.0000 mg | Freq: Once | INTRAMUSCULAR | Status: AC
  Administered 2020-12-06: 10 mg

## 2020-12-06 NOTE — Progress Notes (Signed)
Subjective: [name] is a 55 y.o. female patient presents to office with complaint of moderate heel pain on the left.  Patient admits to post static dyskinesia for 2 to 3 months in duration. Patient has treated this problem with rest changing insoles and diclofenac as given by PCP with no relief.  Reports that pain is constant worse with activity.  Patient runs a daycare.  Denies any other pedal complaints.   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   Uterine leiomyoma 12/10/2018   Foot pain, bilateral 12/02/2018   Chronic right-sided low back pain without sciatica 09/01/2018   Hx of adenomatous colonic polyps 04/21/2017   Prediabetes 02/18/2017   Routine general medical examination at a health care facility 07/09/2015   Smoker 07/09/2015   Hyperlipidemia 07/09/2015   23-polyvalent pneumococcal polysaccharide vaccine declined 07/09/2015   Influenza vaccination declined 07/09/2015   Screen for STD (sexually transmitted disease) 07/09/2015   Special screening for malignant neoplasms, colon 07/09/2015   Lateral epicondylitis of elbow 07/09/2015   Abdominal pain, epigastric 07/09/2015    Current Outpatient Medications on File Prior to Visit  Medication Sig Dispense Refill   acetaminophen (TYLENOL) 500 MG tablet Take 1,000 mg by mouth as needed for mild pain, moderate pain, fever or headache.     Cyanocobalamin (B-12 PO) B12     cyclobenzaprine (FLEXERIL) 10 MG tablet Take 0.5-1 tablets (5-10 mg total) by mouth at bedtime as needed for muscle spasms. (Patient not taking: Reported on 10/31/2020) 15 tablet 0   diclofenac (VOLTAREN) 75 MG EC tablet Take 1 tablet (75 mg total) by mouth 2 (two) times daily. 30 tablet 0   ELDERBERRY PO Take by mouth. (Patient not taking: No sig reported)     methocarbamol (ROBAXIN)  500 MG tablet Take 1-2 tablets (500-1,000 mg total) by mouth every 8 (eight) hours as needed for muscle spasms. (Patient not taking: Reported on 10/31/2020) 20 tablet 0   Multiple Vitamin (MULTIVITAMIN) capsule Take 1 capsule by mouth daily.     simvastatin (ZOCOR) 40 MG tablet Take 1 tablet daily 90 tablet 1   No current facility-administered medications on file prior to visit.    Allergies  Allergen Reactions   Aspirin Nausea Only and Other (See Comments)    Stomach cramping   Ibuprofen     Pt stated, "Upsets stomach while I am on my cholesterol medicine"    Objective: Physical Exam General: The patient is alert and oriented x3 in no acute distress.  Dermatology: Skin is warm, dry and supple bilateral lower extremities. Nails 1-10 are normal. There is no erythema, edema, no eccymosis, no open lesions present. Integument is otherwise unremarkable.  Vascular: Dorsalis Pedis pulse and Posterior Tibial pulse are 2/4 bilateral. Capillary fill time is immediate to all digits.  Neurological: Grossly intact to light touch bilateral.  Musculoskeletal: Tenderness to palpation at the medial calcaneal tubercale and through the insertion of the plantar fascia on the left foot. No pain with compression of calcaneus bilateral. No pain with tuning fork to calcaneus bilateral. No pain with calf compression bilateral. There is decreased Ankle joint range of motion bilateral. All other joints range of motion within normal limits bilateral. Strength 5/5 in all groups bilateral.   Gait: Unassisted, Antalgic avoid weight on left heel  Xray, left  foot Normal osseous mineralization. Joint spaces preserved. No fracture/dislocation/boney destruction.  Minimal calcaneal spur present with mild thickening of plantar fascia. No other soft tissue abnormalities or radiopaque foreign bodies.   Assessment and Plan: Problem List Items Addressed This Visit   None Visit Diagnoses     Pain in left foot    -  Primary    Relevant Orders   DG Foot Complete Left   Plantar fasciitis of left foot           -Complete examination performed.  -Xrays reviewed -Discussed with patient in detail the condition of plantar fasciitis, how this occurs and general treatment options. Explained both conservative and surgical treatments.  -After oral consent and aseptic prep, injected a mixture containing 1 ml of 2%  plain lidocaine, 1 ml 0.5% plain marcaine, 0.5 ml of kenalog 10 and 0.5 ml of dexamethasone phosphate into left heel. Post-injection care discussed with patient. - Explained in detail the use of the heel lifts which was dispensed at today's visit. -Explained and dispensed to patient daily stretching exercises. -Recommend patient to ice affected area 1-2x daily. -Patient to return to office in 4 weeks for follow up or sooner if problems or questions arise.  Landis Martins, DPM

## 2021-01-10 ENCOUNTER — Other Ambulatory Visit: Payer: Self-pay

## 2021-01-10 ENCOUNTER — Encounter: Payer: Self-pay | Admitting: Sports Medicine

## 2021-01-10 ENCOUNTER — Ambulatory Visit (INDEPENDENT_AMBULATORY_CARE_PROVIDER_SITE_OTHER): Payer: BC Managed Care – PPO | Admitting: Sports Medicine

## 2021-01-10 DIAGNOSIS — M722 Plantar fascial fibromatosis: Secondary | ICD-10-CM | POA: Diagnosis not present

## 2021-01-10 DIAGNOSIS — M79672 Pain in left foot: Secondary | ICD-10-CM | POA: Diagnosis not present

## 2021-01-10 MED ORDER — PREDNISONE 10 MG (21) PO TBPK: ORAL_TABLET | ORAL | 0 refills | Status: DC

## 2021-01-10 NOTE — Progress Notes (Signed)
Subjective: [name] is a 55 y.o. female patient returns to office with complaint of moderate heel pain on the left.  Patient reports that previous shot did not help at all pain is the same reports that she is stretching and icing but did forget about using her heel lifts.  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  Reports that she has a difficult time being able to rest her feet.  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   Uterine leiomyoma 12/10/2018   Foot pain, bilateral 12/02/2018   Chronic right-sided low back pain without sciatica 09/01/2018   Hx of adenomatous colonic polyps 04/21/2017   Prediabetes 02/18/2017   Routine general medical examination at a health care facility 07/09/2015   Smoker 07/09/2015   Hyperlipidemia 07/09/2015   23-polyvalent pneumococcal polysaccharide vaccine declined 07/09/2015   Influenza vaccination declined 07/09/2015   Screen for STD (sexually transmitted disease) 07/09/2015   Special screening for malignant neoplasms, colon 07/09/2015   Lateral epicondylitis of elbow 07/09/2015   Abdominal pain, epigastric 07/09/2015    Current Outpatient Medications on File Prior to Visit  Medication Sig Dispense Refill   acetaminophen (TYLENOL) 500 MG tablet Take 1,000 mg by mouth as needed for mild pain, moderate pain, fever or headache.     Cyanocobalamin (B-12 PO) B12     cyclobenzaprine (FLEXERIL) 10 MG tablet Take 0.5-1 tablets (5-10 mg total) by mouth at bedtime as needed for muscle spasms. 15 tablet 0   diclofenac (VOLTAREN) 75 MG EC tablet Take 1 tablet (75 mg total) by mouth 2 (two) times daily. 30 tablet 0   ELDERBERRY PO Take by mouth.      methocarbamol (ROBAXIN) 500 MG tablet Take 1-2 tablets (500-1,000 mg total) by mouth every 8 (eight) hours as needed for muscle spasms. 20 tablet 0   Multiple Vitamin (MULTIVITAMIN) capsule Take 1 capsule by mouth daily.     simvastatin (ZOCOR) 40 MG tablet Take 1 tablet daily 90 tablet 1   No current facility-administered medications on file prior to visit.    Allergies  Allergen Reactions   Aspirin Nausea Only and Other (See Comments)    Stomach cramping   Ibuprofen     Pt stated, "Upsets stomach while I am on my cholesterol medicine"    Objective: Physical Exam General: The patient is alert and oriented x3 in no acute distress.  Dermatology: Skin is warm, dry and supple bilateral lower extremities. Nails 1-10 are normal. There is no erythema, edema, no eccymosis, no open lesions present. Integument is otherwise unremarkable.  Vascular: Dorsalis Pedis pulse and Posterior Tibial pulse are 2/4 bilateral. Capillary fill time is immediate to all digits.  Neurological: Grossly intact to light touch bilateral.  Musculoskeletal: Tenderness to palpation at the medial calcaneal tubercale and through the insertion of the plantar fascia on the left foot. No pain with compression of calcaneus bilateral. No pain with tuning fork to calcaneus bilateral. No pain with calf compression bilateral. There is decreased Ankle joint range of motion bilateral. All other joints range of motion within normal limits bilateral. Strength 5/5 in all groups bilateral.   Assessment and Plan:  Problem List Items Addressed This Visit   None Visit Diagnoses     Plantar fasciitis of left foot    -  Primary   Pain in left foot           -Complete examination performed.  -Re-discussed treatment for plantar fasciitis  -Dispensed short cam boot for patient to use as directed for 2 weeks after 2 weeks if pain is much better may transition from boot to tennis shoe with heel lifts bilateral -Rx prednisone Dosepak  to take as directed. -Recommend patient to ice affected area 1-2x daily. -Patient to return to office in 3-4 weeks for follow up or sooner if problems or questions arise.  Landis Martins, DPM

## 2021-01-12 ENCOUNTER — Other Ambulatory Visit: Payer: Self-pay

## 2021-01-12 ENCOUNTER — Emergency Department (HOSPITAL_BASED_OUTPATIENT_CLINIC_OR_DEPARTMENT_OTHER): Payer: BC Managed Care – PPO

## 2021-01-12 ENCOUNTER — Emergency Department (HOSPITAL_BASED_OUTPATIENT_CLINIC_OR_DEPARTMENT_OTHER)
Admission: EM | Admit: 2021-01-12 | Discharge: 2021-01-12 | Disposition: A | Discharge status: Home / Self Care | Payer: BC Managed Care – PPO | Attending: Emergency Medicine | Admitting: Emergency Medicine

## 2021-01-12 ENCOUNTER — Encounter (HOSPITAL_BASED_OUTPATIENT_CLINIC_OR_DEPARTMENT_OTHER): Payer: Self-pay | Admitting: *Deleted

## 2021-01-12 DIAGNOSIS — K7689 Other specified diseases of liver: Secondary | ICD-10-CM | POA: Diagnosis not present

## 2021-01-12 DIAGNOSIS — Z87891 Personal history of nicotine dependence: Secondary | ICD-10-CM | POA: Diagnosis not present

## 2021-01-12 DIAGNOSIS — K529 Noninfective gastroenteritis and colitis, unspecified: Secondary | ICD-10-CM | POA: Diagnosis not present

## 2021-01-12 DIAGNOSIS — Z20822 Contact with and (suspected) exposure to covid-19: Secondary | ICD-10-CM | POA: Diagnosis not present

## 2021-01-12 DIAGNOSIS — R110 Nausea: Secondary | ICD-10-CM | POA: Diagnosis not present

## 2021-01-12 DIAGNOSIS — R1011 Right upper quadrant pain: Secondary | ICD-10-CM | POA: Diagnosis not present

## 2021-01-12 DIAGNOSIS — R1013 Epigastric pain: Secondary | ICD-10-CM | POA: Diagnosis not present

## 2021-01-12 DIAGNOSIS — R1110 Vomiting, unspecified: Secondary | ICD-10-CM | POA: Diagnosis not present

## 2021-01-12 LAB — URINALYSIS, ROUTINE W REFLEX MICROSCOPIC
Bilirubin Urine: NEGATIVE
Glucose, UA: NEGATIVE mg/dL
Ketones, ur: NEGATIVE mg/dL
Leukocytes,Ua: NEGATIVE
Nitrite: NEGATIVE
Specific Gravity, Urine: 1.033 — ABNORMAL HIGH (ref 1.005–1.030)
pH: 6 (ref 5.0–8.0)

## 2021-01-12 LAB — CBC
HCT: 43 % (ref 36.0–46.0)
Hemoglobin: 14.2 g/dL (ref 12.0–15.0)
MCH: 27.7 pg (ref 26.0–34.0)
MCHC: 33 g/dL (ref 30.0–36.0)
MCV: 83.8 fL (ref 80.0–100.0)
Platelets: 271 10*3/uL (ref 150–400)
RBC: 5.13 MIL/uL — ABNORMAL HIGH (ref 3.87–5.11)
RDW: 12 % (ref 11.5–15.5)
WBC: 9.3 10*3/uL (ref 4.0–10.5)
nRBC: 0 % (ref 0.0–0.2)

## 2021-01-12 LAB — RESP PANEL BY RT-PCR (FLU A&B, COVID) ARPGX2
Influenza A by PCR: NEGATIVE
Influenza B by PCR: NEGATIVE
SARS Coronavirus 2 by RT PCR: NEGATIVE

## 2021-01-12 LAB — COMPREHENSIVE METABOLIC PANEL
ALT: 29 U/L (ref 0–44)
AST: 22 U/L (ref 15–41)
Albumin: 4.8 g/dL (ref 3.5–5.0)
Alkaline Phosphatase: 64 U/L (ref 38–126)
Anion gap: 11 (ref 5–15)
BUN: 18 mg/dL (ref 6–20)
CO2: 25 mmol/L (ref 22–32)
Calcium: 9.6 mg/dL (ref 8.9–10.3)
Chloride: 103 mmol/L (ref 98–111)
Creatinine, Ser: 0.67 mg/dL (ref 0.44–1.00)
GFR, Estimated: >60 mL/min (ref >60)
Glucose, Bld: 103 mg/dL — ABNORMAL HIGH (ref 70–99)
Potassium: 3.6 mmol/L (ref 3.5–5.1)
Sodium: 139 mmol/L (ref 135–145)
Total Bilirubin: 0.6 mg/dL (ref 0.3–1.2)
Total Protein: 7.5 g/dL (ref 6.5–8.1)

## 2021-01-12 LAB — LIPASE, BLOOD: Lipase: 21 U/L (ref 11–51)

## 2021-01-12 LAB — PREGNANCY, URINE: Preg Test, Ur: NEGATIVE

## 2021-01-12 MED ORDER — ONDANSETRON HCL 4 MG/2ML IJ SOLN
4.0000 mg | Freq: Once | INTRAMUSCULAR | Status: AC
  Administered 2021-01-12: 4 mg via INTRAVENOUS
  Filled 2021-01-12: qty 2

## 2021-01-12 MED ORDER — PANTOPRAZOLE SODIUM 40 MG IV SOLR
40.0000 mg | Freq: Once | INTRAVENOUS | Status: AC
  Administered 2021-01-12: 40 mg via INTRAVENOUS
  Filled 2021-01-12: qty 40

## 2021-01-12 MED ORDER — ONDANSETRON HCL 4 MG PO TABS: 4.0000 mg | ORAL_TABLET | Freq: Four times a day (QID) | ORAL | 0 refills | Status: DC

## 2021-01-12 MED ORDER — SODIUM CHLORIDE 0.9 % IV BOLUS
1000.0000 mL | Freq: Once | INTRAVENOUS | Status: AC
  Administered 2021-01-12: 1000 mL via INTRAVENOUS

## 2021-01-12 NOTE — Discharge Instructions (Addendum)
Maintain a bland diet for the next 24 hours.  You do have a cyst on your liver that will need follow-up by your primary care doctor.  The radiologist is recommending an MRI of this area.  This can be arranged by your primary care doctor.  Return here as needed if you have any worsening symptoms.

## 2021-01-12 NOTE — ED Triage Notes (Addendum)
Pt started on steroids yesterday, last night developed difficulty breathing with N/V/D, pt hyperventilating in triage. Adds she also has a headache

## 2021-01-12 NOTE — ED Provider Notes (Signed)
[facility] DEPT Provider Note   CSN: [id number] Arrival date & time: 01/12/21  1118     History Chief Complaint  Patient presents with   Shortness of Breath   Emesis   Diarrhea    [name] is a 55 y.o. female.  Patient is a 55 year old female who presents with nausea vomiting and diarrhea.  She states she woke up this morning with the symptoms.  She also has some pain in her upper abdomen.  She did start some prednisone last night for Planter fasciitis.  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  She also ate some food last night and the person who also ate the food also has vomiting and diarrhea.  She has nonbloody, nonbilious emesis.  Watery, nonbloody diarrhea.  No fevers.  She has had a prior BTL but no other abdominal surgeries.      Past Medical History:  Diagnosis Date   Anemia    Encounter for routine gynecological examination    Dr. 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   Uterine leiomyoma 12/10/2018   Foot pain, bilateral 12/02/2018   Chronic right-sided low back pain without sciatica 09/01/2018   Hx of adenomatous colonic polyps 04/21/2017   Prediabetes 02/18/2017   Routine general medical examination at a health care facility 07/09/2015   Smoker 07/09/2015   Hyperlipidemia 07/09/2015   23-polyvalent pneumococcal  polysaccharide vaccine declined 07/09/2015   Influenza vaccination declined 07/09/2015   Screen for STD (sexually transmitted disease) 07/09/2015   Special screening for malignant neoplasms, colon 07/09/2015   Lateral epicondylitis of elbow 07/09/2015   Abdominal pain, epigastric 07/09/2015    Past Surgical History:  Procedure Laterality Date   BREAST BIOPSY Left 04/2019   ADH w/ calcs   BREAST BIOPSY Right 04/2019   fibrocystic change w/ calcs   BREAST EXCISIONAL BIOPSY Left 06/07/2019   atypical ductal hyperplasia (cm)    BREAST LUMPECTOMY WITH RADIOACTIVE SEED LOCALIZATION Left 06/07/2019   Procedure: LEFT BREAST LUMPECTOMY WITH RADIOACTIVE SEED LOCALIZATION;  Surgeon: Erroll Luna, MD;  Location: [facility];  Service: General;  Laterality: Left;   DILATATION & CURRETTAGE/HYSTEROSCOPY WITH RESECTOCOPE N/A 10/31/2014   Procedure: DILATATION & CURETTAGE/HYSTEROSCOPY WITH RESECTOCOPE, RESECTION OF POLYP WITH MYOSURE ;  Surgeon: Crawford Givens, MD;  Location: [facility];  Service: Gynecology;  Laterality: N/A;   ECTOPIC PREGNANCY SURGERY     MYOMECTOMY     TONSILLECTOMY  as a child   TUBAL LIGATION     WISDOM TOOTH EXTRACTION Bilateral      OB History   No obstetric history on file.     Family History  Problem Relation Age of Onset   Hypertension Mother    Hepatitis Mother  Hepatitis C   Asthma Sister    Stroke Maternal Aunt 53       2   Diabetes Maternal Grandmother        leg amputation   Heart disease Maternal Grandmother    Hypertension Maternal Grandmother    Cancer Paternal Aunt        lung cancer   Breast cancer Other        mat great aunts x3   Cirrhosis Maternal Aunt    Breast cancer Cousin        first cousins once removed x2   Colon cancer Neg Hx    Rectal cancer Neg Hx    Stomach cancer Neg Hx    Colon polyps Neg Hx     Social History   Tobacco Use   Smoking status: Former    Packs/day: 0.25    Years: 36.00    Pack years: 9.00     Types: Cigarettes    Quit date: 08/2018    Years since quitting: 2.4   Smokeless tobacco: Never  Vaping Use   Vaping Use: Never used  Substance Use Topics   Alcohol use: Yes    Comment: 2x per mo   Drug use: No    Home Medications Prior to Admission medications   Medication Sig Start Date End Date Taking? Authorizing Provider  ondansetron (ZOFRAN) 4 MG tablet Take 1 tablet (4 mg total) by mouth every 6 (six) hours. 01/12/21  Yes Malvin Johns, MD  acetaminophen (TYLENOL) 500 MG tablet Take 1,000 mg by mouth as needed for mild pain, moderate pain, fever or headache.    [provider]  Cyanocobalamin (B-12 PO) B12    [provider]  cyclobenzaprine (FLEXERIL) 10 MG tablet Take 0.5-1 tablets (5-10 mg total) by mouth at bedtime as needed for muscle spasms. 09/04/20   Henson, Vickie L, NP-C  diclofenac (VOLTAREN) 75 MG EC tablet Take 1 tablet (75 mg total) by mouth 2 (two) times daily. 10/31/20   Denita Lung, MD  ELDERBERRY PO Take by mouth.    [provider]  methocarbamol (ROBAXIN) 500 MG tablet Take 1-2 tablets (500-1,000 mg total) by mouth every 8 (eight) hours as needed for muscle spasms. 09/04/20   Henson, Vickie L, NP-C  Multiple Vitamin (MULTIVITAMIN) capsule Take 1 capsule by mouth daily.    [provider]  predniSONE (STERAPRED UNI-PAK 21 TAB) 10 MG (21) TBPK tablet Take as directed 01/10/21   Landis Martins, DPM  simvastatin (ZOCOR) 40 MG tablet Take 1 tablet daily 09/06/20   Harland Dingwall L, NP-C    Allergies    Aspirin and Ibuprofen  Review of Systems   Review of Systems  Constitutional:  Negative for chills, diaphoresis, fatigue and fever.  HENT:  Negative for congestion, rhinorrhea and sneezing.   Eyes: Negative.   Respiratory:  Negative for cough, chest tightness and shortness of breath.   Cardiovascular:  Negative for chest pain and leg swelling.  Gastrointestinal:  Positive for abdominal pain, diarrhea, nausea and vomiting.  Negative for blood in stool.  Genitourinary:  Negative for difficulty urinating, flank pain, frequency and hematuria.  Musculoskeletal:  Negative for arthralgias and back pain.  Skin:  Negative for rash.  Neurological:  Negative for dizziness, speech difficulty, weakness, numbness and headaches.   Physical Exam Updated Vital Signs BP 118/62   Pulse 72   Temp 98 F (36.7 C) (Oral)   Resp 19   Ht '5\' 6"'$  (1.676 m)  Wt 93 kg   LMP 10/08/2011   SpO2 99%   BMI 33.09 kg/m   Physical Exam Constitutional:      Appearance: She is well-developed.  HENT:     Head: Normocephalic and atraumatic.  Eyes:     Pupils: Pupils are equal, round, and reactive to light.  Cardiovascular:     Rate and Rhythm: Normal rate and regular rhythm.     Heart sounds: Normal heart sounds.  Pulmonary:     Effort: Pulmonary effort is normal. No respiratory distress.     Breath sounds: Normal breath sounds. No wheezing or rales.  Chest:     Chest wall: No tenderness.  Abdominal:     General: Bowel sounds are normal.     Palpations: Abdomen is soft.     Tenderness: There is abdominal tenderness (Tenderness to her epigastrium and right upper quadrant). There is no guarding or rebound.  Musculoskeletal:        General: Normal range of motion.     Cervical back: Normal range of motion and neck supple.  Lymphadenopathy:     Cervical: No cervical adenopathy.  Skin:    General: Skin is warm and dry.     Findings: No rash.  Neurological:     Mental Status: She is alert and oriented to person, place, and time.    ED Results / Procedures / Treatments   Labs (all labs ordered are listed, but only abnormal results are displayed) Labs Reviewed  COMPREHENSIVE METABOLIC PANEL - Abnormal; Notable for the following components:      Result Value   Glucose, Bld 103 (*)    All other components within normal limits  CBC - Abnormal; Notable for the following components:   RBC 5.13 (*)    All other components within  normal limits  URINALYSIS, ROUTINE W REFLEX MICROSCOPIC - Abnormal; Notable for the following components:   Specific Gravity, Urine 1.033 (*)    Hgb urine dipstick SMALL (*)    Protein, ur TRACE (*)    All other components within normal limits  RESP PANEL BY RT-PCR (FLU A&B, COVID) ARPGX2  LIPASE, BLOOD  PREGNANCY, URINE    EKG None  Radiology US Abdomen Limited RUQ (LIVER/GB)  Result Date: 01/12/2021 CLINICAL DATA:  Epigastric pain with nausea, vomiting, diarrhea. EXAM: ULTRASOUND ABDOMEN LIMITED RIGHT UPPER QUADRANT COMPARISON:  Abdominal radiograph dated 03/01/2012 and CT abdomen pelvis dated 07/26/2008. FINDINGS: Gallbladder: No gallstones or wall thickening visualized. No sonographic Murphy sign noted by sonographer. Common bile duct: Diameter: 2 mm Liver: Multiple benign hepatic cysts are noted. A collection of multiple adjacent cysts measures 4.9 x 3.6 x 4.1 cm in the right hepatic lobe. A hypoechoic mass in the right liver measures 1.6 x 0.9 x 1.6 cm and is not definitely cystic. Increased parenchymal echogenicity. Portal vein is patent on color Doppler imaging with normal direction of blood flow towards the liver. Other: None. IMPRESSION: 1. Hypoechoic mass in the right liver measures 1.6 cm and is not definitely cystic. When the patient is clinically stable and able to follow directions and hold their breath (preferably as an outpatient) further evaluation with dedicated abdominal MRI should be considered. 2. Increased liver echogenicity is consistent with hepatic steatosis. Electronically Signed   By: Zerita Boers M.D.   On: 01/12/2021 13:34    Procedures Procedures   Medications Ordered in ED Medications  sodium chloride 0.9 % bolus 1,000 mL (0 mLs Intravenous Stopped 01/12/21 1445)  ondansetron (ZOFRAN) injection 4  mg (4 mg Intravenous Given 01/12/21 1324)  pantoprazole (PROTONIX) injection 40 mg (40 mg Intravenous Given 01/12/21 1326)    ED Course  I have reviewed the triage  vital signs and the nursing notes.  Pertinent labs & imaging results that were available during my care of the patient were reviewed by me and considered in my medical decision making (see chart for details).    MDM Rules/Calculators/A&P                           Patient is a 55 year old female who presents with upper abdominal pain, vomiting and diarrhea.  Her labs are nonconcerning.  There is no evidence of pancreatitis.  Her ultrasound shows no evidence of cholecystitis or gallstones.  She does have a liver cyst that we will need outpatient follow-up.  I did explain this to the patient.  She was given IV fluids and antiemetics as well as Protonix.  She feels much better after this.  She has no ongoing abdominal pain.  She is able to tolerate oral fluids without ongoing vomiting.  I feel this is likely viral in nature.  She was discharged home in good condition.  She was given a prescription for Zofran.  Symptomatic care instructions were given.  Return precautions were given. Final Clinical Impression(s) / ED Diagnoses Final diagnoses:  RUQ pain  Gastroenteritis  Liver cyst    Rx / DC Orders ED Discharge Orders          Ordered    ondansetron (ZOFRAN) 4 MG tablet  Every 6 hours        01/12/21 1504             Malvin Johns, MD 01/12/21 1506

## 2021-01-15 ENCOUNTER — Telehealth: Payer: Self-pay | Admitting: Family Medicine

## 2021-01-15 NOTE — Telephone Encounter (Signed)
Left message for pt concerning ER visit. PT was advised to call the office if follow up was needed.

## 2021-01-16 ENCOUNTER — Ambulatory Visit (INDEPENDENT_AMBULATORY_CARE_PROVIDER_SITE_OTHER): Payer: BC Managed Care – PPO | Admitting: Family Medicine

## 2021-01-16 ENCOUNTER — Encounter: Payer: Self-pay | Admitting: Family Medicine

## 2021-01-16 ENCOUNTER — Other Ambulatory Visit: Payer: Self-pay

## 2021-01-16 VITALS — BP 126/82 | HR 65 | Temp 98.3°F | Wt 206.8 lb

## 2021-01-16 DIAGNOSIS — R160 Hepatomegaly, not elsewhere classified: Secondary | ICD-10-CM

## 2021-01-16 DIAGNOSIS — K760 Fatty (change of) liver, not elsewhere classified: Secondary | ICD-10-CM

## 2021-01-16 DIAGNOSIS — K769 Liver disease, unspecified: Secondary | ICD-10-CM

## 2021-01-16 NOTE — Progress Notes (Signed)
   Subjective:    Patient ID: [name], female    DOB: 1965-08-20, 55 y.o.   MRN: [id number]  HPI Chief Complaint  Patient presents with   other    Hospital f/u had stomach issues they did a [place] and found a cyst on her liver, had throwing up and diarrhea that why she went to the hospital, pt. Refused flu shot.    She is here for a ED follow up from 5 days ago. She presented to the ED with N/V/D and was diagnosed with gastroenteritis.  States she is back to baseline in regards to GI symptoms.  Incidental finding on US showed liver mass, cysts and fatty liver disease.  Liver mass warrants an MRI per radiology recommendations.   Denies regular alcohol or NSAID use.   Denies fever, chills, dizziness, chest pain, palpitations, shortness of breath, abdominal pain, N/V/D, urinary symptoms, LE edema.   Reviewed allergies, medications, past medical, surgical, family, and social history.    Review of Systems Pertinent positives and negatives in the history of present illness.     Objective:   Physical Exam Constitutional:      General: She is not in acute distress.    Appearance: Normal appearance. She is not ill-appearing.  Eyes:     Conjunctiva/sclera: Conjunctivae normal.  Cardiovascular:     Rate and Rhythm: Normal rate and regular rhythm.     Pulses: Normal pulses.  Pulmonary:     Effort: Pulmonary effort is normal.     Breath sounds: Normal breath sounds.  Abdominal:     General: Abdomen is flat. Bowel sounds are normal.     Palpations: Abdomen is soft.     Tenderness: There is generalized abdominal tenderness. There is no right CVA tenderness, left CVA tenderness, guarding or rebound. Negative signs include Murphy's sign, McBurney's sign and psoas sign.  Musculoskeletal:     Cervical back: Normal range of motion and neck supple.  Skin:    General: Skin is warm and dry.  Neurological:     General: No focal deficit present.     Mental Status: She is alert and  oriented to person, place, and time.  Psychiatric:        Mood and Affect: Mood normal.        Thought Content: Thought content normal.   BP 126/82   Pulse 65   Temp 98.3 F (36.8 C)   Wt 206 lb 12.8 oz (93.8 kg) Comment: with a boot on  LMP 10/08/2011   BMI 33.38 kg/m       Assessment & Plan:  Liver mass - Plan: MR Abdomen W Wo Contrast  Hepatic steatosis - Plan: MR Abdomen W Wo Contrast  Liver disease - Plan: MR Abdomen W Wo Contrast  No sign of an acute infectious process or any red flag symptoms.  Reviewed ED notes and results with patient. Symptoms resolved.  MRI ordered for liver mass as recommended per radiologist.  Counseling on fatty liver disease. Follow up pending result.

## 2021-01-30 ENCOUNTER — Ambulatory Visit
Admission: RE | Admit: 2021-01-30 | Discharge: 2021-01-30 | Disposition: A | Discharge status: Home / Self Care | Payer: BC Managed Care – PPO | Source: Ambulatory Visit | Attending: Family Medicine | Admitting: Family Medicine

## 2021-01-30 ENCOUNTER — Other Ambulatory Visit: Payer: Self-pay

## 2021-01-30 DIAGNOSIS — R160 Hepatomegaly, not elsewhere classified: Secondary | ICD-10-CM

## 2021-01-30 DIAGNOSIS — K769 Liver disease, unspecified: Secondary | ICD-10-CM

## 2021-01-30 DIAGNOSIS — K760 Fatty (change of) liver, not elsewhere classified: Secondary | ICD-10-CM

## 2021-01-30 DIAGNOSIS — K7689 Other specified diseases of liver: Secondary | ICD-10-CM | POA: Diagnosis not present

## 2021-01-30 MED ORDER — GADOBENATE DIMEGLUMINE 529 MG/ML IV SOLN
19.0000 mL | Freq: Once | INTRAVENOUS | Status: AC | PRN
  Administered 2021-01-30: 19 mL via INTRAVENOUS

## 2021-01-30 MED ORDER — GADOBENATE DIMEGLUMINE 529 MG/ML IV SOLN: 19.0000 mL | Freq: Once | INTRAVENOUS | Status: DC | PRN

## 2021-01-31 ENCOUNTER — Ambulatory Visit (INDEPENDENT_AMBULATORY_CARE_PROVIDER_SITE_OTHER): Payer: BC Managed Care – PPO | Admitting: Sports Medicine

## 2021-01-31 ENCOUNTER — Encounter: Payer: Self-pay | Admitting: Sports Medicine

## 2021-01-31 DIAGNOSIS — M79672 Pain in left foot: Secondary | ICD-10-CM

## 2021-01-31 DIAGNOSIS — M722 Plantar fascial fibromatosis: Secondary | ICD-10-CM | POA: Diagnosis not present

## 2021-01-31 NOTE — Progress Notes (Signed)
Subjective: [name] is a 55 y.o. female patient returns to office with complaint of heel pain on the left.  Patient reports that she feels about 50% better.  States that she still has pain worse at night states that the boot and prednisone has helped she feels also like the heel lifts have helped as well.  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   Uterine leiomyoma 12/10/2018   Foot pain, bilateral 12/02/2018   Chronic right-sided low back pain without sciatica 09/01/2018   Hx of adenomatous colonic polyps 04/21/2017   Prediabetes 02/18/2017   Routine general medical examination at a health care facility 07/09/2015   Smoker 07/09/2015   Hyperlipidemia 07/09/2015   23-polyvalent pneumococcal polysaccharide vaccine declined 07/09/2015   Influenza vaccination declined 07/09/2015   Screen for STD (sexually transmitted disease) 07/09/2015   Special screening for malignant neoplasms, colon 07/09/2015   Lateral epicondylitis of elbow 07/09/2015   Abdominal pain, epigastric 07/09/2015    Current Outpatient Medications on File Prior to Visit  Medication Sig Dispense Refill   acetaminophen (TYLENOL) 500 MG tablet Take 1,000 mg by mouth as needed for mild pain, moderate pain, fever or headache.     Cyanocobalamin (B-12 PO)      cyclobenzaprine (FLEXERIL) 10 MG tablet Take 0.5-1 tablets (5-10 mg total) by mouth at bedtime as needed for muscle spasms. 15 tablet 0   diclofenac (VOLTAREN) 75 MG EC tablet Take 1 tablet (75 mg total) by mouth 2 (two) times daily. 30 tablet 0   ELDERBERRY PO Take by mouth.     methocarbamol (ROBAXIN) 500 MG tablet Take 1-2 tablets (500-1,000 mg total) by mouth every 8 (eight) hours as needed for muscle spasms. 20 tablet 0   Multiple Vitamin  (MULTIVITAMIN) capsule Take 1 capsule by mouth daily.     ondansetron (ZOFRAN) 4 MG tablet Take 1 tablet (4 mg total) by mouth every 6 (six) hours. 12 tablet 0   predniSONE (STERAPRED UNI-PAK 21 TAB) 10 MG (21) TBPK tablet Take as directed 21 tablet 0   simvastatin (ZOCOR) 40 MG tablet Take 1 tablet daily 90 tablet 1   No current facility-administered medications on file prior to visit.    Allergies  Allergen Reactions   Aspirin Nausea Only and Other (See Comments)    Stomach cramping   Ibuprofen     Pt stated, "Upsets stomach while I am on my cholesterol medicine"    Objective: Physical Exam General: The patient is alert and oriented x3 in no acute distress.  Dermatology: Skin is warm, dry and supple bilateral lower extremities. Nails 1-10 are normal. There is no erythema, edema, no eccymosis, no open lesions present. Integument is otherwise unremarkable.  Vascular: Dorsalis Pedis pulse and Posterior Tibial pulse are 2/4 bilateral. Capillary fill time is immediate to all digits.  Neurological: Grossly intact to light touch bilateral.  Musculoskeletal: Tenderness to palpation at the medial calcaneal tubercale and through the insertion of the plantar fascia on the left foot. No pain with compression of calcaneus bilateral. No pain with tuning fork to calcaneus bilateral. No pain with calf compression bilateral. There is decreased Ankle joint range of motion bilateral. All other joints range of motion within normal limits bilateral. Strength 5/5 in all groups  bilateral.   Assessment and Plan: Problem List Items Addressed This Visit   None Visit Diagnoses     Plantar fasciitis of left foot    -  Primary   Pain in left foot           -Complete examination performed.  -Re-discussed treatment for plantar fasciitis  -Dispensed plantar fascial brace to use as directed -Continue with good supportive shoes and heel lifts as directed -Recommend patient to ice affected area 1-2x daily.   Advised patient to be more consistent with this especially in the evening ice pack provided. -Patient to return to office in 4 weeks for follow up or sooner if problems or questions arise.  Discussed with patient possibility of adding on physical therapy if she still continues with pain next visit.  Landis Martins, DPM

## 2021-02-14 NOTE — Progress Notes (Signed)
   Subjective:    Patient ID: [name], female    DOB: April 04, 1966, 55 y.o.   MRN: [id number]  HPI Chief Complaint  Patient presents with   Hyperlipidemia    Ate 1/2 sausage biscuit this morning and water   She is here to follow up on HL. She is not fasting today.  HL- states she has been taking simvastatin 40 mg daily without any concerns.    Review of Systems Pertinent positives and negatives in the history of present illness.     Objective:   Physical Exam BP 118/78 (BP Location: Right Arm, Patient Position: Sitting)   Pulse 74   Ht 5\' 6"  (1.676 m)   Wt 210 lb 3.2 oz (95.3 kg)   LMP 10/08/2011   SpO2 95%   BMI 33.93 kg/m   Alert and oriented and in no acute distress.       Assessment & Plan:  Mixed hyperlipidemia  Medication management  She will return fasting one day next week. Consider switching statin if LDL is not controlled.

## 2021-02-15 ENCOUNTER — Ambulatory Visit (INDEPENDENT_AMBULATORY_CARE_PROVIDER_SITE_OTHER): Payer: BC Managed Care – PPO | Admitting: Family Medicine

## 2021-02-15 ENCOUNTER — Other Ambulatory Visit: Payer: Self-pay

## 2021-02-15 ENCOUNTER — Encounter: Payer: Self-pay | Admitting: Family Medicine

## 2021-02-15 VITALS — BP 118/78 | HR 74 | Ht 66.0 in | Wt 210.2 lb

## 2021-02-15 DIAGNOSIS — Z79899 Other long term (current) drug therapy: Secondary | ICD-10-CM

## 2021-02-15 DIAGNOSIS — E782 Mixed hyperlipidemia: Secondary | ICD-10-CM

## 2021-02-18 ENCOUNTER — Other Ambulatory Visit: Payer: Self-pay

## 2021-02-18 ENCOUNTER — Other Ambulatory Visit: Payer: BC Managed Care – PPO

## 2021-02-18 ENCOUNTER — Other Ambulatory Visit: Payer: Self-pay | Admitting: Family Medicine

## 2021-02-18 DIAGNOSIS — Z79899 Other long term (current) drug therapy: Secondary | ICD-10-CM

## 2021-02-18 DIAGNOSIS — E782 Mixed hyperlipidemia: Secondary | ICD-10-CM

## 2021-02-19 LAB — LIPID PANEL
Chol/HDL Ratio: 3 ratio (ref 0.0–4.4)
Cholesterol, Total: 173 mg/dL (ref 100–199)
HDL: 58 mg/dL (ref >39)
LDL Chol Calc (NIH): 101 mg/dL — ABNORMAL HIGH (ref 0–99)
Triglycerides: 76 mg/dL (ref 0–149)
VLDL Cholesterol Cal: 14 mg/dL (ref 5–40)

## 2021-02-28 ENCOUNTER — Ambulatory Visit: Payer: BC Managed Care – PPO | Admitting: Sports Medicine

## 2021-03-06 ENCOUNTER — Ambulatory Visit: Payer: BC Managed Care – PPO | Admitting: Family Medicine

## 2021-03-13 ENCOUNTER — Other Ambulatory Visit: Payer: Self-pay | Admitting: Family Medicine

## 2021-03-13 ENCOUNTER — Other Ambulatory Visit: Payer: Self-pay

## 2021-03-13 DIAGNOSIS — M722 Plantar fascial fibromatosis: Secondary | ICD-10-CM

## 2021-03-13 MED ORDER — SIMVASTATIN 40 MG PO TABS: ORAL_TABLET | ORAL | 1 refills | Status: DC

## 2021-03-14 ENCOUNTER — Ambulatory Visit (INDEPENDENT_AMBULATORY_CARE_PROVIDER_SITE_OTHER): Payer: BC Managed Care – PPO | Admitting: Family Medicine

## 2021-03-14 ENCOUNTER — Encounter: Payer: Self-pay | Admitting: Family Medicine

## 2021-03-14 ENCOUNTER — Other Ambulatory Visit: Payer: Self-pay

## 2021-03-14 VITALS — BP 110/74 | HR 71 | Temp 96.9°F | Wt 213.6 lb

## 2021-03-14 DIAGNOSIS — M722 Plantar fascial fibromatosis: Secondary | ICD-10-CM | POA: Diagnosis not present

## 2021-03-14 MED ORDER — DICLOFENAC SODIUM 75 MG PO TBEC: 75.0000 mg | DELAYED_RELEASE_TABLET | Freq: Two times a day (BID) | ORAL | 0 refills | Status: DC

## 2021-03-14 NOTE — Progress Notes (Signed)
   Subjective:    Patient ID: [name], female    DOB: 05/30/1965, 55 y.o.   MRN: [id number]  HPI She is here for recheck on plantar fasciitis.  She states that the diclofenac actually did help with the discomfort.  She would like a refill on that.  Review of the record also indicates she has been seen by podiatry since then and has had steroid injection, pills, boot, brace, inserts.  She states that the pain is now down to a level 6.  She has an appointment to see podiatry in a couple weeks.   Review of Systems     Objective:   Physical Exam Alert and in no distress.  Tender to palpation over the calcaneal spur.       Assessment & Plan:  Plantar fasciitis of left foot - Plan: diclofenac (VOLTAREN) 75 MG EC tablet I again reinforced the fact that she needs to do as much stretching as possible for this and keep her appointment with podiatrist.  She now realizes that just taking the medication is not enough and she needs to be more aggressive and uses a conservative approach for much longer period of time.

## 2021-03-28 ENCOUNTER — Ambulatory Visit: Payer: BC Managed Care – PPO | Admitting: Sports Medicine

## 2021-04-25 ENCOUNTER — Other Ambulatory Visit: Payer: Self-pay

## 2021-04-25 ENCOUNTER — Ambulatory Visit (INDEPENDENT_AMBULATORY_CARE_PROVIDER_SITE_OTHER): Payer: BC Managed Care – PPO | Admitting: Sports Medicine

## 2021-04-25 DIAGNOSIS — M79672 Pain in left foot: Secondary | ICD-10-CM

## 2021-04-25 DIAGNOSIS — M722 Plantar fascial fibromatosis: Secondary | ICD-10-CM

## 2021-04-25 MED ORDER — DICLOFENAC SODIUM 75 MG PO TBEC: 75.0000 mg | DELAYED_RELEASE_TABLET | Freq: Two times a day (BID) | ORAL | 0 refills | Status: DC

## 2021-04-25 NOTE — Progress Notes (Signed)
Subjective: [name] is a 55 y.o. female patient returns to office with complaint of heel pain on the left.  Patient reports that she still has pain states that it is better but when she sits down and go to stand up has a lot of pain.  Patient reports that previous prescription of diclofenac helped.  Patient denies any other pedal complaints at this time.  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   Uterine leiomyoma 12/10/2018   Foot pain, bilateral 12/02/2018   Chronic right-sided low back pain without sciatica 09/01/2018   Hx of adenomatous colonic polyps 04/21/2017   Prediabetes 02/18/2017   Routine general medical examination at a health care facility 07/09/2015   Smoker 07/09/2015   Hyperlipidemia 07/09/2015   23-polyvalent pneumococcal polysaccharide vaccine declined 07/09/2015   Influenza vaccination declined 07/09/2015   Screen for STD (sexually transmitted disease) 07/09/2015   Special screening for malignant neoplasms, colon 07/09/2015   Lateral epicondylitis of elbow 07/09/2015   Abdominal pain, epigastric 07/09/2015    Current Outpatient Medications on File Prior to Visit  Medication Sig Dispense Refill   acetaminophen (TYLENOL) 500 MG tablet Take 1,000 mg by mouth as needed for mild pain, moderate pain, fever or headache.     Cyanocobalamin (B-12 PO)  (Patient not taking: No sig reported)     cyclobenzaprine (FLEXERIL) 10 MG tablet Take 0.5-1 tablets (5-10 mg total) by mouth at bedtime as needed for muscle spasms. (Patient not taking: Reported on 03/14/2021) 15 tablet 0   Multiple Vitamin (MULTIVITAMIN) capsule Take 1 capsule by mouth daily.     simvastatin (ZOCOR) 40 MG tablet Take 1 tablet daily 90 tablet 1   No current facility-administered  medications on file prior to visit.    Allergies  Allergen Reactions   Aspirin Nausea Only and Other (See Comments)    Stomach cramping   Ibuprofen     Pt stated, "Upsets stomach while I am on my cholesterol medicine"    Objective: Physical Exam General: The patient is alert and oriented x3 in no acute distress.  Dermatology: Skin is warm, dry and supple bilateral lower extremities. Nails 1-10 are normal. There is no erythema, edema, no eccymosis, no open lesions present. Integument is otherwise unremarkable.  Vascular: Dorsalis Pedis pulse and Posterior Tibial pulse are 2/4 bilateral. Capillary fill time is immediate to all digits.  Neurological: Grossly intact to light touch bilateral.  Musculoskeletal: Tenderness to palpation at the medial calcaneal tubercale and through the insertion of the plantar fascia on the left foot. No pain with compression of calcaneus bilateral. No pain with tuning fork to calcaneus bilateral. No pain with calf compression bilateral. There is decreased Ankle joint range of motion bilateral. All other joints range of motion within normal limits bilateral. Strength 5/5 in all groups bilateral.   Assessment and Plan: Problem List Items Addressed This Visit   None Visit Diagnoses     Pain in left foot    -  Primary   Plantar fasciitis of left foot       Relevant Medications   diclofenac (VOLTAREN) 75 MG EC tablet       -Complete examination performed.  -Re-discussed treatment for plantar fasciitis  -Refill diclofenac -Rx physical therapy -Continue with plantar  fascial bracing -Continue with good supportive shoes and heel lifts as directed -Recommend patient to continue with icing 1 to twice daily -Patient to return to office in 4 weeks for follow up or sooner if problems recur.  Landis Martins, DPM

## 2021-05-10 ENCOUNTER — Ambulatory Visit: Payer: BC Managed Care – PPO

## 2021-05-18 NOTE — Therapy (Signed)
OUTPATIENT PHYSICAL THERAPY LOWER EXTREMITY EVALUATION   Patient Name: [name] MRN: [id number] DOB:February 15, 1966, 56 y.o., female Today's Date: 05/21/2021   PT End of Session - 05/21/21 1140     Visit Number 1    Number of Visits 9    Date for PT Re-Evaluation 07/16/21    Authorization Type [employer]    Authorization Time Period FOTO v6, v10    PT Start Time 1055    PT Stop Time 1135    PT Time Calculation (min) 40 min    Activity Tolerance Patient tolerated treatment well    Behavior During Therapy WFL for tasks assessed/performed             Past Medical History:  Diagnosis Date   Anemia    Encounter for routine gynecological examination    Dr. Charlesetta Garibaldi   Family history of breast cancer    Family history of lung cancer    History of blood transfusion 2009   d/t Hg ~4   Hx of adenomatous colonic polyps 04/21/2017   Hyperlipidemia 2014   Prediabetes    SVD (spontaneous vaginal delivery)    x 2   Ulcer as a child   gastric ulcer, unable to take aspirin   Uterine fibroid    Past Surgical History:  Procedure Laterality Date   BREAST BIOPSY Left 04/2019   ADH w/ calcs   BREAST BIOPSY Right 04/2019   fibrocystic change w/ calcs   BREAST EXCISIONAL BIOPSY Left 06/07/2019   atypical ductal hyperplasia (cm)    BREAST LUMPECTOMY WITH RADIOACTIVE SEED LOCALIZATION Left 06/07/2019   Procedure: LEFT BREAST LUMPECTOMY WITH RADIOACTIVE SEED LOCALIZATION;  Surgeon: Erroll Luna, MD;  Location: [facility];  Service: General;  Laterality: Left;   DILATATION & CURRETTAGE/HYSTEROSCOPY WITH RESECTOCOPE N/A 10/31/2014   Procedure: DILATATION & CURETTAGE/HYSTEROSCOPY WITH RESECTOCOPE, RESECTION OF POLYP WITH MYOSURE ;  Surgeon: Crawford Givens, MD;  Location: [facility];  Service: Gynecology;  Laterality: N/A;   ECTOPIC PREGNANCY SURGERY     MYOMECTOMY     TONSILLECTOMY  as a child   TUBAL LIGATION     WISDOM TOOTH EXTRACTION Bilateral    Patient Active Problem  List   Diagnosis Date Noted   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   Family history of breast cancer    Family history of lung cancer    Herpes simplex 12/10/2018   History of anemia 12/10/2018   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   Uterine leiomyoma 12/10/2018   Foot pain, bilateral 12/02/2018   Chronic right-sided low back pain without sciatica 09/01/2018   Hx of adenomatous colonic polyps 04/21/2017   Prediabetes 02/18/2017   Routine general medical examination at a health care facility 07/09/2015   Smoker 07/09/2015   Hyperlipidemia 07/09/2015   23-polyvalent pneumococcal polysaccharide vaccine declined 07/09/2015   Influenza vaccination declined 07/09/2015   Screen for STD (sexually transmitted disease) 07/09/2015   Special screening for malignant neoplasms, colon 07/09/2015   Lateral epicondylitis of elbow 07/09/2015   Abdominal pain, epigastric 07/09/2015    PCP: Girtha Rm, PA-C  REFERRING PROVIDER: Landis Martins, DPM  REFERRING DIAG: M72.2 (ICD-10-CM) - Plantar fasciitis of left foot  THERAPY DIAG:  Pain in left foot  Muscle weakness (generalized)  Difficulty in walking, not elsewhere classified  ONSET DATE: Jan 2022  SUBJECTIVE:   SUBJECTIVE STATEMENT: Pt reports primary c/o Lt plantar heel and medial arch pain or insidious onset lasting about one year. She reports severe pain with her first  step when getting out of bed in the morning or when standing after prolonged sitting. She reports her pain is present when standing and walking, as well as with going up/ down stairs. She reports that the diclofenac helps to decrease the inflammation in her foot and has helped with pain. Current pain is 4/10, worst pain is 10/10, best pain is 2/10. Pt denies any unexplained weight change, nausea/ vomiting, unrelenting night pain.  PERTINENT HISTORY: N/A  PAIN:  Are you having pain? Yes VAS scale: 4/10 Pain location: Foot Pain orientation: Left, Medial, and  Posterior  PAIN TYPE: aching and sharp Pain description: constant  Aggravating factors: Standing after prolonged sitting; standing; walking; ascending/descending stairs Relieving factors: Diclofenac, rest  PRECAUTIONS: None  WEIGHT BEARING RESTRICTIONS No  FALLS:  Has patient fallen in last 6 months? 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 Joint spaces preserved. No fracture/dislocation/boney destruction.  Minimal calcaneal spur present with mild thickening of plantar fascia. No other soft tissue abnormalities or radiopaque foreign bodies.   PATIENT SURVEYS:  FOTO 49%, projected 64% in 13 visits  COGNITION:  Overall cognitive status: Within functional limits for tasks assessed     SENSATION:  Light touch: Appears intact    MUSCLE LENGTH: Gastrocnemius: Severe limitations BIL Soleus: Severe limitations BIL  OBSERVATION:  Pes cavus BIL  PALPATION: TTP to Lt plantar calcaneus, medial arch  LE AROM/PROM:  A/PROM Right 05/21/2021 Left 05/21/2021  Ankle dorsiflexion 0,5 -9,-2p!  Ankle plantarflexion 42,45 46,46  Ankle inversion 16,34 21,40  Ankle eversion 16,22 10,25   (Blank rows = not tested)  LE MMT:  MMT Right 05/21/2021 Left 05/21/2021  Ankle dorsiflexion 5/5 5/5  Ankle plantarflexion 5/5 5/5  Ankle inversion 5/5 5/5  Ankle eversion 5/5 5/5   (Blank rows = not tested)  LOWER EXTREMITY SPECIAL TESTS:   Windlass Test: (-) on Lt  FUNCTIONAL TESTS:  Squat: WNL SLS x15 seconds: WNL on Rt, 1 step error on Lt Double leg heel raise x20: WNL Single leg heel raise x10: performed with increased ankle eversion BIL, pain on Lt  GAIT: X20 feet,  increase BIL calcaneal eversion; pt walks primarily on lateral aspect of feet    TODAY'S TREATMENT: [facility] Adult PT Treatment:                                                DATE: 05/21/2021 Therapeutic Exercise: Standing gastrocnemius stretch at wall x25min on Lt Standing soleus stretch at wall x13min on Lt Standing tibialis posterior heel raise with ball under medial ankles 2x10 Seated manual plantar fascia stretch x1 min on Lt Manual Therapy: N/A Neuromuscular re-ed: N/A Therapeutic Activity: N/A Modalities: N/A Self Care: N/A    PATIENT EDUCATION:  Education details: Pt educated on probable underlying pathophysiology behind her pain presentation, prognosis, POC, and HEP Person educated: Patient Education method: Consulting civil engineer, Demonstration, and Handouts Education comprehension: verbalized understanding and returned demonstration   HOME EXERCISE PROGRAM: Access Code: [email or web address]  Exercises Standing heel raise WITH TENNIS BALL - 1 x daily - 7 x weekly - 3 sets - 10  reps Seated Plantar Fascia Stretch - 1 x daily - 7 x weekly - 2 sets - 25min hold Gastroc Stretch on Wall - 1 x daily - 7 x weekly - 2 sets - 78min hold Soleus Stretch on Wall - 1 x daily - 7 x weekly - 2 sets - 46min hold   ASSESSMENT:  CLINICAL IMPRESSION: Patient is a 56 y.o. F who was seen today for physical therapy evaluation and treatment for chronic Lt plantar heel/ medial arch pain. Upon assessment, the pt's primary impairments include pain with single leg heel raises, TTP to plantar calcaneus/ medial arch on Lt, severely limited BIL gastrocnemius and soleus extensibility, severely limited BIL ankle dorsiflexion AROM with pain on Lt with PROM, and BIL pes cavus and calcaneal eversion with gait. Ruling up plantar fasciitis due to positive first step sign, exaggerated BIL pes cavus, TTP to plantar heel and medial arch, and pain with stairs and single leg heel raises. Patient will benefit from skilled PT to address  above impairments and improve overall function.  REHAB POTENTIAL: Excellent  CLINICAL DECISION MAKING: Stable/uncomplicated  EVALUATION COMPLEXITY: Low   GOALS: Goals reviewed with patient? Yes  SHORT TERM GOALS:  STG Name Target Date Goal status  1 Pt will report understanding and adherence to his HEP in order to promote independence in the management of her primary impairments. Baseline: HEP provided at eval 06/18/2021 INITIAL   LONG TERM GOALS:   LTG Name Target Date Goal status  1 Pt will achieve a FOTO score of 64% in order to demonstrate improved functional ability as it relates to her foot pain. 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 Baseline: >4/10 pain with any amount of walking 07/16/2021 INITIAL  3 Pt will achieve BIL ankle dorsiflexion AROM >5 degrees in order to promote a WNL gait pattern. Baseline: See AROM chart in Objective findings 07/16/2021 INITIAL   PLAN: PT FREQUENCY: 1x/week  PT DURATION: 8 weeks  PLANNED INTERVENTIONS: Therapeutic exercises, Therapeutic activity, Neuro Muscular re-education, Balance training, Gait training, Patient/Family education, Joint mobilization, Dry Needling, Cryotherapy, Moist heat, Taping, Vasopneumatic device, and Manual therapy  PLAN FOR NEXT SESSION: Progress posterior tibialis reinforcement, calf stretching, weight bearing in good foot/ ankle alignment   Vanessa [place], PT, DPT 05/21/21 11:50 AM

## 2021-05-21 ENCOUNTER — Ambulatory Visit: Payer: BC Managed Care – PPO | Attending: Sports Medicine

## 2021-05-21 ENCOUNTER — Other Ambulatory Visit: Payer: Self-pay

## 2021-05-21 DIAGNOSIS — M722 Plantar fascial fibromatosis: Secondary | ICD-10-CM | POA: Insufficient documentation

## 2021-05-21 DIAGNOSIS — R262 Difficulty in walking, not elsewhere classified: Secondary | ICD-10-CM | POA: Diagnosis not present

## 2021-05-21 DIAGNOSIS — M6281 Muscle weakness (generalized): Secondary | ICD-10-CM | POA: Diagnosis not present

## 2021-05-21 DIAGNOSIS — M79672 Pain in left foot: Secondary | ICD-10-CM

## 2021-05-23 ENCOUNTER — Other Ambulatory Visit: Payer: Self-pay | Admitting: Surgery

## 2021-05-23 DIAGNOSIS — Z1231 Encounter for screening mammogram for malignant neoplasm of breast: Secondary | ICD-10-CM

## 2021-05-29 ENCOUNTER — Ambulatory Visit (INDEPENDENT_AMBULATORY_CARE_PROVIDER_SITE_OTHER): Payer: BC Managed Care – PPO | Admitting: Family Medicine

## 2021-05-29 ENCOUNTER — Other Ambulatory Visit: Payer: Self-pay

## 2021-05-29 ENCOUNTER — Encounter: Payer: Self-pay | Admitting: Family Medicine

## 2021-05-29 VITALS — BP 124/84 | HR 68 | Temp 97.5°F | Ht 66.0 in | Wt 209.8 lb

## 2021-05-29 DIAGNOSIS — B079 Viral wart, unspecified: Secondary | ICD-10-CM | POA: Diagnosis not present

## 2021-05-29 DIAGNOSIS — R2232 Localized swelling, mass and lump, left upper limb: Secondary | ICD-10-CM | POA: Diagnosis not present

## 2021-05-29 NOTE — Progress Notes (Signed)
Chief Complaint  Patient presents with   Spot    Patient has a spot (scar tissue) under her left arm area. Had some tissue removed from this area in 2021. Not painful, she still notices it when she does her self exams-feels like same spot when they removed tissue last time.    Patient presents today with concern regarding a lump in her left axilla. She previously had a excisional breast biopsy (05/2019) on the left, for atypical ductal hyperplasia. She had noted a lump in her L axilla, and had an ultrasound done 11/2019. This was normal (pt was told it was scar tissue).  Last mammogram was 07/2020, screening mammo, normal.  Currently she feels like the "same issue" in the L axilla. She doesn't think there has been any change in the lump in the L axilla--not growing, not tender. She does regular breast exams, denies any concerns.  Sister and cousin are dealing with breast cancer currently.  She became tearful in discussing this, and is very worried that this could be cancer. Sister is on chemo, recently hospitalized.  Chart reviewed, including prior imaging, pathology. FINAL MICROSCOPIC DIAGNOSIS:  A. BREAST, LEFT, LUMPECTOMY:  -  Focal atypical ductal hyperplasia  -  Previous biopsy site changes  -  No malignancy identified    She also notes a growth on the Right forearm, which has been there for at least 6 months. It is a little itchy.  She had a similar lesion removed from her chest by Dr. Denna Haggard in 08/2019, pathology showed it was a verrucca.    PMH, PSH, SH reviewed FH updated (sister's breast cancer diagnosed at 69)  Outpatient Encounter Medications as of 05/29/2021  Medication Sig Note   Multiple Vitamin (MULTIVITAMIN) capsule Take 1 capsule by mouth daily.    simvastatin (ZOCOR) 40 MG tablet Take 1 tablet daily    acetaminophen (TYLENOL) 500 MG tablet Take 1,000 mg by mouth as needed for mild pain, moderate pain, fever or headache. (Patient not taking: Reported on 05/29/2021)  05/29/2021: As needed   diclofenac (VOLTAREN) 75 MG EC tablet Take 1 tablet (75 mg total) by mouth 2 (two) times daily. (Patient not taking: Reported on 05/29/2021) 05/29/2021: As needed   [DISCONTINUED] Cyanocobalamin (B-12 PO)  (Patient not taking: Reported on 02/15/2021)    [DISCONTINUED] cyclobenzaprine (FLEXERIL) 10 MG tablet Take 0.5-1 tablets (5-10 mg total) by mouth at bedtime as needed for muscle spasms. (Patient not taking: Reported on 03/14/2021)    No facility-administered encounter medications on file as of 05/29/2021.    Allergies  Allergen Reactions   Aspirin Nausea Only and Other (See Comments)    Stomach cramping   Ibuprofen     Pt stated, "Upsets stomach while I am on my cholesterol medicine"   ROS: Denies fever, chills, URI symptoms, headaches, dizziness, shortness of breath, chest pain.  Denies nausea, vomiting, bowel changes, urinary complaints, bleeding, bruising. Bump on R forearm Lump in L axilla, per HPI   PHYSICAL EXAM:  BP 124/84    Pulse 68    Temp (!) 97.5 F (36.4 C) (Tympanic)    Ht 5\' 6"  (1.676 m)    Wt 209 lb 12.8 oz (95.2 kg)    LMP 10/08/2011    BMI 33.86 kg/m   Well-appearing female in no distress. Large breasts. No nipple discharge, inversion or skin dimpling.  Bilateral breast exams were normal, no masses or tenderness. L axilla--there is a 2-70mm mobile, superficial lump, round/smooth. Slightlythickening elsewhere in the axilla  Heart: regular rate and rhythm Lungs: clear Skin:  2-2.4mm verruccal lesion R anterior forearm.  Skin tag on chest Psych: became tearful in discussing her sister's cancer, appears anxious. Normal eye contact, speech, hygiene and grooming Neuro: alert and oriented, normal strength, gait   ASSESSMENT/PLAN:  Lump in armpit, left - prev [place] benign. Slight thickening of breast tissue also  noted at tail (suspect scar tissue). Diag mammo and [place] on L to eval - Plan: MM DIAG BREAST TOMO UNI LEFT, US BREAST LTD UNI LEFT INC  AXILLA, CANCELED: US BREAST LTD UNI RIGHT INC AXILLA, CANCELED: MM Digital Diagnostic Bilat  Verruca vulgaris - R forearm. Treated with verrucca-freeze x 3 applications.  f/u if persists. Proper care reviewed  Discussed genetic counseling--best if her sister were to get (the relative who has cancer) She can discuss this further with her OB-GYN Overall reassured that the small lump that she feels is benign.

## 2021-05-30 NOTE — Therapy (Signed)
OUTPATIENT PHYSICAL THERAPY TREATMENT NOTE   Patient Name: [name] MRN: [id number] DOB:[birth year], 56 y.o., female Today's Date: 05/31/2021  PCP: Girtha Rm, PA-C REFERRING PROVIDER: Girtha Rm, PA-C   PT End of Session - 05/31/21 1220     Visit Number 2    Number of Visits 9    Date for PT Re-Evaluation 07/16/21    Authorization Type BCBS    Authorization Time Period FOTO v6, v10    PT Start Time 3810    PT Stop Time 1250    PT Time Calculation (min) 30 min    Activity Tolerance Patient tolerated treatment well    Behavior During Therapy WFL for tasks assessed/performed             Past Medical History:  Diagnosis Date   Anemia    Encounter for routine gynecological examination    Dr. Charlesetta Garibaldi   Family history of breast cancer    Family history of lung cancer    History of blood transfusion 2009   d/t Hg ~4   Hx of adenomatous colonic polyps 04/21/2017   Hyperlipidemia 2014   Prediabetes    SVD (spontaneous vaginal delivery)    x 2   Ulcer as a child   gastric ulcer, unable to take aspirin   Uterine fibroid    Past Surgical History:  Procedure Laterality Date   BREAST BIOPSY Left 04/2019   ADH w/ calcs   BREAST BIOPSY Right 04/2019   fibrocystic change w/ calcs   BREAST EXCISIONAL BIOPSY Left 06/07/2019   atypical ductal hyperplasia (cm)    BREAST LUMPECTOMY WITH RADIOACTIVE SEED LOCALIZATION Left 06/07/2019   Procedure: LEFT BREAST LUMPECTOMY WITH RADIOACTIVE SEED LOCALIZATION;  Surgeon: Erroll Luna, MD;  Location: [facility];  Service: General;  Laterality: Left;   DILATATION & CURRETTAGE/HYSTEROSCOPY WITH RESECTOCOPE N/A 10/31/2014   Procedure: [place], RESECTION OF POLYP WITH MYOSURE ;  Surgeon: Crawford Givens, MD;  Location: [facility];  Service: Gynecology;  Laterality: N/A;   ECTOPIC PREGNANCY SURGERY     MYOMECTOMY     TONSILLECTOMY  as a child   TUBAL LIGATION      WISDOM TOOTH EXTRACTION Bilateral    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   Uterine leiomyoma 12/10/2018   Foot pain, bilateral 12/02/2018   Chronic right-sided low back pain without sciatica 09/01/2018   Hx of adenomatous colonic polyps 04/21/2017   Prediabetes 02/18/2017   Routine general medical examination at a health care facility 07/09/2015   Smoker 07/09/2015   Hyperlipidemia 07/09/2015   23-polyvalent pneumococcal polysaccharide vaccine declined 07/09/2015   Influenza vaccination declined 07/09/2015   Screen for STD (sexually transmitted disease) 07/09/2015   Special screening for malignant neoplasms, colon 07/09/2015   Lateral epicondylitis of elbow 07/09/2015   Abdominal pain, epigastric 07/09/2015    REFERRING DIAG: M72.2 (ICD-10-CM) - Plantar fasciitis of left foot  THERAPY DIAG:  Pain in left foot  Muscle weakness (generalized)  Difficulty in walking, not elsewhere classified  SUBJECTIVE: Pt reports doing her HEP about twice since her eval and adds that her foot is already starting to feel better.  PAIN:  Are you having pain? Yes NPRS scale: 4/10 Pain location: Lt  medial arch, plantar heel Pain description: sharp and aching  Aggravating factors: Standing after prolonged sitting; standing; walking; ascending/descending stairs Relieving factors: Diclofenac, rest     OBJECTIVE:  *Unless otherwise noted, objective information was collected previously*   DIAGNOSTIC FINDINGS: 12/06/2020 Xray, left foot Normal osseous mineralization. Joint spaces preserved. No fracture/dislocation/boney destruction.  Minimal calcaneal spur present with mild thickening of plantar fascia. No other soft tissue abnormalities or radiopaque foreign bodies.     PATIENT SURVEYS:  FOTO 49%, projected 64% in 13 visits   COGNITION:          Overall cognitive status: Within functional limits for tasks assessed                        SENSATION:          Light touch: Appears intact             MUSCLE LENGTH: Gastrocnemius: Severe limitations BIL Soleus: Severe limitations BIL   OBSERVATION:  Pes cavus BIL   PALPATION: TTP to Lt plantar calcaneus, medial arch   LE AROM/PROM:   A/PROM Right 05/21/2021 Left 05/21/2021  Ankle dorsiflexion 0,5 -9,-2p!  Ankle plantarflexion 42,45 46,46  Ankle inversion 16,34 21,40  Ankle eversion 16,22 10,25   (Blank rows = not tested)   LE MMT:   MMT Right 05/21/2021 Left 05/21/2021  Ankle dorsiflexion 5/5 5/5  Ankle plantarflexion 5/5 5/5  Ankle inversion 5/5 5/5  Ankle eversion 5/5 5/5   (Blank rows = not tested)   LOWER EXTREMITY SPECIAL TESTS:    Windlass Test: (-) on Lt   FUNCTIONAL TESTS:  Squat: WNL SLS x15 seconds: WNL on Rt, 1 step error on Lt Double leg heel raise x20: WNL Single leg heel raise x10: performed with increased ankle eversion BIL, pain on Lt   GAIT: X20 feet, increase BIL calcaneal eversion; pt walks primarily on lateral aspect of feet       TODAY'S TREATMENT:  [facility] Adult PT Treatment:                                                DATE: 05/31/2021 Therapeutic Exercise: Standing soleus stretch on slant board with UE support x2 minutes Standing gastroc stretch on slant board with UE support x2 minutes Heel raises with tennis ball between ankles in // bars 2x10 with 3-sec holds Forward lunge on Lt and Rt sides of BOSU ball in narrow stance 2x10 each BIL Heel/toe rocks on Bosu ball in // bars 2x20 Manual Therapy: N/A  Neuromuscular re-ed: N/A Therapeutic Activity: N/A Modalities: N/A Self Care: N/A   [facility] Adult PT Treatment:                                                DATE: 05/21/2021 Therapeutic Exercise: Standing gastrocnemius stretch at wall x55min on  Lt Standing soleus stretch at wall x2min on Lt Standing tibialis posterior heel raise with ball under medial ankles 2x10 Seated manual plantar fascia stretch x1 min on Lt Long-sitting plantar fascia stretch with towel x2 minutes Manual Therapy: N/A Neuromuscular re-ed: N/A Therapeutic Activity: N/A Modalities: N/A Self Care: N/A       PATIENT EDUCATION:  Education details: Educated on  importance of daily adherence to HEP Person educated: Patient Education method: Explanation, Demonstration, and Handouts Education comprehension: verbalized understanding and returned demonstration     HOME EXERCISE PROGRAM: Access Code: [email or web address]   Exercises Standing heel raise WITH TENNIS BALL - 1 x daily - 7 x weekly - 3 sets - 10 reps Seated Plantar Fascia Stretch - 1 x daily - 7 x weekly - 2 sets - 49min hold Gastroc Stretch on Wall - 1 x daily - 7 x weekly - 2 sets - 60min hold Soleus Stretch on Wall - 1 x daily - 7 x weekly - 2 sets - 57min hold     ASSESSMENT:   CLINICAL IMPRESSION: Pt responded well to all interventions today, demonstrating good form and no increase in pain with selected interventions. Of note, she demonstrates good foundational ankle control with BOSU ball exercises. Pt reports that she has to leave 10 minutes early due to having another appointment scheduled for 1:00pm. She will continue to benefit from skilled PT to address her primary impairments and return to her prior level of function with less limitation.   REHAB POTENTIAL: Excellent   CLINICAL DECISION MAKING: Stable/uncomplicated   EVALUATION COMPLEXITY: Low     GOALS: Goals reviewed with patient? Yes   SHORT TERM GOALS:   STG Name Target Date Goal status  1 Pt will report understanding and adherence to his HEP in order to promote independence in the management of her primary impairments. Baseline: HEP provided at eval 06/18/2021 INITIAL    LONG TERM GOALS:    LTG Name Target Date Goal status  1 Pt  will achieve a FOTO score of 64% in order to demonstrate improved functional ability as it relates to her foot pain. 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 Baseline: >4/10 pain with any amount of walking 07/16/2021 INITIAL  3 Pt will achieve BIL ankle dorsiflexion AROM >5 degrees in order to promote a WNL gait pattern. Baseline: See AROM chart in Objective findings 07/16/2021 INITIAL    PLAN: PT FREQUENCY: 1x/week   PT DURATION: 8 weeks   PLANNED INTERVENTIONS: Therapeutic exercises, Therapeutic activity, Neuro Muscular re-education, Balance training, Gait training, Patient/Family education, Joint mobilization, Dry Needling, Cryotherapy, Moist heat, Taping, Vasopneumatic device, and Manual therapy   PLAN FOR NEXT SESSION: Progress posterior tibialis reinforcement, calf stretching, weight bearing in good foot/ ankle alignment    Vanessa [place], PT, DPT 05/31/21 12:50 PM

## 2021-05-31 ENCOUNTER — Other Ambulatory Visit: Payer: Self-pay

## 2021-05-31 ENCOUNTER — Ambulatory Visit: Payer: BC Managed Care – PPO

## 2021-05-31 DIAGNOSIS — M6281 Muscle weakness (generalized): Secondary | ICD-10-CM

## 2021-05-31 DIAGNOSIS — R262 Difficulty in walking, not elsewhere classified: Secondary | ICD-10-CM

## 2021-05-31 DIAGNOSIS — M722 Plantar fascial fibromatosis: Secondary | ICD-10-CM | POA: Diagnosis not present

## 2021-05-31 DIAGNOSIS — M79672 Pain in left foot: Secondary | ICD-10-CM

## 2021-06-06 NOTE — Therapy (Incomplete)
OUTPATIENT PHYSICAL THERAPY TREATMENT NOTE   Patient Name: [name] MRN: [id number] DOB:1965/10/22, 56 y.o., female Today's Date: 06/06/2021  PCP: Girtha Rm, PA-C REFERRING PROVIDER: Landis Martins, DPM     Past Medical History:  Diagnosis Date   Anemia    Encounter for routine gynecological examination    Dr. Charlesetta Garibaldi   Family history of breast cancer    Family history of lung cancer    History of blood transfusion 2009   d/t Hg ~4   Hx of adenomatous colonic polyps 04/21/2017   Hyperlipidemia 2014   Prediabetes    SVD (spontaneous vaginal delivery)    x 2   Ulcer as a child   gastric ulcer, unable to take aspirin   Uterine fibroid    Past Surgical History:  Procedure Laterality Date   BREAST BIOPSY Left 04/2019   ADH w/ calcs   BREAST BIOPSY Right 04/2019   fibrocystic change w/ calcs   BREAST EXCISIONAL BIOPSY Left 06/07/2019   atypical ductal hyperplasia (cm)    BREAST LUMPECTOMY WITH RADIOACTIVE SEED LOCALIZATION Left 06/07/2019   Procedure: LEFT BREAST LUMPECTOMY WITH RADIOACTIVE SEED LOCALIZATION;  Surgeon: Erroll Luna, MD;  Location: [facility];  Service: General;  Laterality: Left;   DILATATION & CURRETTAGE/HYSTEROSCOPY WITH RESECTOCOPE N/A 10/31/2014   Procedure: [place], RESECTION OF POLYP WITH MYOSURE ;  Surgeon: Crawford Givens, MD;  Location: [facility];  Service: Gynecology;  Laterality: N/A;   ECTOPIC PREGNANCY SURGERY     MYOMECTOMY     TONSILLECTOMY  as a child   TUBAL LIGATION     WISDOM TOOTH EXTRACTION Bilateral    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   Uterine leiomyoma 12/10/2018   Foot pain, bilateral 12/02/2018   Chronic right-sided low back  pain without sciatica 09/01/2018   Hx of adenomatous colonic polyps 04/21/2017   Prediabetes 02/18/2017   Routine general medical examination at a health care facility 07/09/2015   Smoker 07/09/2015   Hyperlipidemia 07/09/2015   23-polyvalent pneumococcal polysaccharide vaccine declined 07/09/2015   Influenza vaccination declined 07/09/2015   Screen for STD (sexually transmitted disease) 07/09/2015   Special screening for malignant neoplasms, colon 07/09/2015   Lateral epicondylitis of elbow 07/09/2015   Abdominal pain, epigastric 07/09/2015    REFERRING DIAG: M72.2 (ICD-10-CM) - Plantar fasciitis of left foot  THERAPY DIAG:  No diagnosis found.  SUBJECTIVE: ***  PAIN:  Are you having pain? Yes NPRS scale: 4/10 Pain location: Lt medial arch, plantar heel Pain description: sharp and aching  Aggravating factors: Standing after prolonged sitting; standing; walking; ascending/descending stairs Relieving factors: Diclofenac, rest     OBJECTIVE:  *Unless otherwise noted, objective information was collected previously*   DIAGNOSTIC FINDINGS: 12/06/2020 Xray, left foot Normal osseous mineralization. Joint spaces preserved. No fracture/dislocation/boney destruction.  Minimal calcaneal spur present with mild thickening of plantar fascia. No other soft tissue abnormalities or radiopaque foreign bodies.    PATIENT SURVEYS:  FOTO 49%, projected 64% in 13 visits   COGNITION:          Overall cognitive status: Within functional limits for tasks assessed  SENSATION:          Light touch: Appears intact             MUSCLE LENGTH: Gastrocnemius: Severe limitations BIL Soleus: Severe limitations BIL   OBSERVATION:  Pes cavus BIL   PALPATION: TTP to Lt plantar calcaneus, medial arch   LE AROM/PROM:   A/PROM Right 05/21/2021 Left 05/21/2021  Ankle dorsiflexion 0,5 -9,-2p!  Ankle plantarflexion 42,45 46,46  Ankle inversion 16,34 21,40  Ankle eversion  16,22 10,25   (Blank rows = not tested)   LE MMT:   MMT Right 05/21/2021 Left 05/21/2021  Ankle dorsiflexion 5/5 5/5  Ankle plantarflexion 5/5 5/5  Ankle inversion 5/5 5/5  Ankle eversion 5/5 5/5   (Blank rows = not tested)   LOWER EXTREMITY SPECIAL TESTS:    Windlass Test: (-) on Lt   FUNCTIONAL TESTS:  Squat: WNL SLS x15 seconds: WNL on Rt, 1 step error on Lt Double leg heel raise x20: WNL Single leg heel raise x10: performed with increased ankle eversion BIL, pain on Lt   GAIT: X20 feet, increase BIL calcaneal eversion; pt walks primarily on lateral aspect of feet       TODAY'S TREATMENT:  [facility] Adult PT Treatment:                                                DATE: 06/07/2021 Therapeutic Exercise: *** Manual Therapy: *** Neuromuscular re-ed: *** Therapeutic Activity: *** Modalities: *** Self Care: ***   [facility] Adult PT Treatment:                                                DATE: 05/31/2021 Therapeutic Exercise: Standing soleus stretch on slant board with UE support x2 minutes Standing gastroc stretch on slant board with UE support x2 minutes Heel raises with tennis ball between ankles in // bars 2x10 with 3-sec holds Forward lunge on Lt and Rt sides of BOSU ball in narrow stance 2x10 each BIL Heel/toe rocks on Bosu ball in // bars 2x20 Manual Therapy: N/A  Neuromuscular re-ed: N/A Therapeutic Activity: N/A Modalities: N/A Self Care: N/A   [facility] Adult PT Treatment:                                                DATE: 05/21/2021 Therapeutic Exercise: Standing gastrocnemius stretch at wall x93min on Lt Standing soleus stretch at wall x48min on Lt Standing tibialis posterior heel raise with ball under medial ankles 2x10 Seated manual plantar fascia stretch x1 min on Lt Long-sitting plantar fascia stretch with towel x2 minutes Manual Therapy: N/A Neuromuscular re-ed: N/A Therapeutic Activity: N/A Modalities: N/A Self Care: N/A       PATIENT  EDUCATION:  Education details: Educated on importance of daily adherence to HEP Person educated: Patient Education method: Consulting civil engineer, Media planner, and Handouts Education comprehension: verbalized understanding and returned demonstration     HOME EXERCISE PROGRAM: Access Code: [email or web address]   Exercises Standing heel raise WITH TENNIS BALL - 1 x daily - 7 x weekly - 3 sets - 10 reps Seated Plantar Fascia Stretch -  1 x daily - 7 x weekly - 2 sets - 77min hold Gastroc Stretch on Wall - 1 x daily - 7 x weekly - 2 sets - 35min hold Soleus Stretch on Wall - 1 x daily - 7 x weekly - 2 sets - 30min hold     ASSESSMENT:   CLINICAL IMPRESSION: ***   REHAB POTENTIAL: Excellent   CLINICAL DECISION MAKING: Stable/uncomplicated   EVALUATION COMPLEXITY: Low     GOALS: Goals reviewed with patient? Yes   SHORT TERM GOALS:   STG Name Target Date Goal status  1 Pt will report understanding and adherence to his HEP in order to promote independence in the management of her primary impairments. Baseline: HEP provided at eval 06/18/2021 INITIAL    LONG TERM GOALS:    LTG Name Target Date Goal status  1 Pt will achieve a FOTO score of 64% in order to demonstrate improved functional ability as it relates to her foot pain. 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 Baseline: >4/10 pain with any amount of walking 07/16/2021 INITIAL  3 Pt will achieve BIL ankle dorsiflexion AROM >5 degrees in order to promote a WNL gait pattern. Baseline: See AROM chart in Objective findings 07/16/2021 INITIAL    PLAN: PT FREQUENCY: 1x/week   PT DURATION: 8 weeks   PLANNED INTERVENTIONS: Therapeutic exercises, Therapeutic activity, Neuro Muscular re-education, Balance training, Gait training, Patient/Family education, Joint mobilization, Dry Needling, Cryotherapy, Moist heat, Taping,  Vasopneumatic device, and Manual therapy   PLAN FOR NEXT SESSION: Progress posterior tibialis reinforcement, calf stretching, weight bearing in good foot/ ankle alignment    Vanessa [place], PT, DPT 06/06/21 9:34 AM

## 2021-06-07 ENCOUNTER — Ambulatory Visit: Payer: BC Managed Care – PPO

## 2021-06-12 NOTE — Therapy (Signed)
OUTPATIENT PHYSICAL THERAPY TREATMENT NOTE   Patient Name: [name] MRN: [id number] DOB:[birth year], 56 y.o., female Today's Date: 06/13/2021  PCP: Girtha Rm, PA-C REFERRING PROVIDER: Landis Martins, DPM   PT End of Session - 06/13/21 1049     Visit Number 3    Number of Visits 9    Date for PT Re-Evaluation 07/16/21    Authorization Type BCBS    Authorization Time Period FOTO v6, v10    PT Start Time 1050    PT Stop Time 1130    PT Time Calculation (min) 40 min    Activity Tolerance Patient tolerated treatment well    Behavior During Therapy WFL for tasks assessed/performed              Past Medical History:  Diagnosis Date   Anemia    Encounter for routine gynecological examination    Dr. Charlesetta Garibaldi   Family history of breast cancer    Family history of lung cancer    History of blood transfusion 2009   d/t Hg ~4   Hx of adenomatous colonic polyps 04/21/2017   Hyperlipidemia 2014   Prediabetes    SVD (spontaneous vaginal delivery)    x 2   Ulcer as a child   gastric ulcer, unable to take aspirin   Uterine fibroid    Past Surgical History:  Procedure Laterality Date   BREAST BIOPSY Left 04/2019   ADH w/ calcs   BREAST BIOPSY Right 04/2019   fibrocystic change w/ calcs   BREAST EXCISIONAL BIOPSY Left 06/07/2019   atypical ductal hyperplasia (cm)    BREAST LUMPECTOMY WITH RADIOACTIVE SEED LOCALIZATION Left 06/07/2019   Procedure: LEFT BREAST LUMPECTOMY WITH RADIOACTIVE SEED LOCALIZATION;  Surgeon: Erroll Luna, MD;  Location: [facility];  Service: General;  Laterality: Left;   DILATATION & CURRETTAGE/HYSTEROSCOPY WITH RESECTOCOPE N/A 10/31/2014   Procedure: [place], RESECTION OF POLYP WITH MYOSURE ;  Surgeon: Crawford Givens, MD;  Location: [facility];  Service: Gynecology;  Laterality: N/A;   ECTOPIC PREGNANCY SURGERY     MYOMECTOMY     TONSILLECTOMY  as a child   TUBAL LIGATION      WISDOM TOOTH EXTRACTION Bilateral    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   Uterine leiomyoma 12/10/2018   Foot pain, bilateral 12/02/2018   Chronic right-sided low back pain without sciatica 09/01/2018   Hx of adenomatous colonic polyps 04/21/2017   Prediabetes 02/18/2017   Routine general medical examination at a health care facility 07/09/2015   Smoker 07/09/2015   Hyperlipidemia 07/09/2015   23-polyvalent pneumococcal polysaccharide vaccine declined 07/09/2015   Influenza vaccination declined 07/09/2015   Screen for STD (sexually transmitted disease) 07/09/2015   Special screening for malignant neoplasms, colon 07/09/2015   Lateral epicondylitis of elbow 07/09/2015   Abdominal pain, epigastric 07/09/2015    REFERRING DIAG: M72.2 (ICD-10-CM) - Plantar fasciitis of left foot  THERAPY DIAG:  Pain in left foot  Muscle weakness (generalized)  Difficulty in walking, not elsewhere classified  SUBJECTIVE: Pt reports continued Lt medial arch pain which started getting worse yesterday. She reports varied adherence to her HEP, about twice per week, since her last visit.   PAIN:  Are you having pain?  Yes NPRS scale: 6/10 Pain location: Lt medial arch, plantar heel Pain description: sharp and aching  Aggravating factors: Standing after prolonged sitting; standing; walking; ascending/descending stairs Relieving factors: Diclofenac, rest     OBJECTIVE:  *Unless otherwise noted, objective information was collected previously*   DIAGNOSTIC FINDINGS: 12/06/2020 Xray, left foot Normal osseous mineralization. Joint spaces preserved. No fracture/dislocation/boney destruction.  Minimal calcaneal spur present with mild thickening of plantar fascia. No other soft tissue  abnormalities or radiopaque foreign bodies.    PATIENT SURVEYS:  FOTO 49%, projected 64% in 13 visits   COGNITION:          Overall cognitive status: Within functional limits for tasks assessed                        SENSATION:          Light touch: Appears intact             MUSCLE LENGTH: Gastrocnemius: Severe limitations BIL Soleus: Severe limitations BIL   OBSERVATION:  Pes cavus BIL   PALPATION: TTP to Lt plantar calcaneus, medial arch   LE AROM/PROM:   A/PROM Right 05/21/2021 Left 05/21/2021  Ankle dorsiflexion 0,5 -9,-2p!  Ankle plantarflexion 42,45 46,46  Ankle inversion 16,34 21,40  Ankle eversion 16,22 10,25   (Blank rows = not tested)   LE MMT:   MMT Right 05/21/2021 Left 05/21/2021  Ankle dorsiflexion 5/5 5/5  Ankle plantarflexion 5/5 5/5  Ankle inversion 5/5 5/5  Ankle eversion 5/5 5/5   (Blank rows = not tested)   LOWER EXTREMITY SPECIAL TESTS:    Windlass Test: (-) on Lt   FUNCTIONAL TESTS:  Squat: WNL SLS x15 seconds: WNL on Rt, 1 step error on Lt Double leg heel raise x20: WNL Single leg heel raise x10: performed with increased ankle eversion BIL, pain on Lt   GAIT: X20 feet, increase BIL calcaneal eversion; pt walks primarily on lateral aspect of feet       TODAY'S TREATMENT:  [facility] Adult PT Treatment:                                                DATE: 06/12/2021 Therapeutic Exercise: Long-sitting plantar fascia stretch with sheet 2x1 min Woodpeckers/ arch blasters at wall 3x8 Standing great toe extension stretch on towel roll 2x30 sec Heel/toe raises on Bosu ball 3x20 Forward lunge on Lt and Rt sides of BOSU ball in narrow stance 2x10 each BIL Eccentric heel raise/ slow lowering on edge of Airex pad 3x15 Gastroc stretch on slant board 2x1 min Manual Therapy: N/A Neuromuscular re-ed: N/A Therapeutic Activity: N/A Modalities: N/A Self Care: N/A   [facility] Adult PT Treatment:                                                DATE:  05/31/2021 Therapeutic Exercise: Standing soleus stretch on slant board with UE support x2 minutes Standing gastroc stretch on slant board with UE support x2 minutes Heel raises with tennis ball between ankles in // bars 2x10 with 3-sec holds Forward lunge on Lt and Rt sides of BOSU ball in narrow stance 2x10 each BIL Heel/toe rocks on Bosu ball in // bars 2x20 Manual  Therapy: N/A  Neuromuscular re-ed: N/A Therapeutic Activity: N/A Modalities: N/A Self Care: N/A   [facility] Adult PT Treatment:                                                DATE: 05/21/2021 Therapeutic Exercise: Standing gastrocnemius stretch at wall x22min on Lt Standing soleus stretch at wall x30min on Lt Standing tibialis posterior heel raise with ball under medial ankles 2x10 Seated manual plantar fascia stretch x1 min on Lt Long-sitting plantar fascia stretch with towel x2 minutes Manual Therapy: N/A Neuromuscular re-ed: N/A Therapeutic Activity: N/A Modalities: N/A Self Care: N/A       PATIENT EDUCATION:  Education details: Educated on importance of daily adherence to HEP Person educated: Patient Education method: Consulting civil engineer, Media planner, and Handouts Education comprehension: verbalized understanding and returned demonstration     HOME EXERCISE PROGRAM: Access Code: [email or web address]   Exercises Standing heel raise WITH TENNIS BALL - 1 x daily - 7 x weekly - 3 sets - 10 reps Seated Plantar Fascia Stretch - 1 x daily - 7 x weekly - 2 sets - 25min hold Gastroc Stretch on Wall - 1 x daily - 7 x weekly - 2 sets - 75min hold Soleus Stretch on Wall - 1 x daily - 7 x weekly - 2 sets - 22min hold     ASSESSMENT:   CLINICAL IMPRESSION: Pt responded well to all interventions, demonstrating good form and no increase in pain with selected exercises.  She reports a "good burn" with new woodpecker exercise. She continues to have medial arch pain, but this is alleviated with stretching. The pt will continue to benefit  from skilled PT to address her primary impairments and return to her prior level of function with less limitation.   REHAB POTENTIAL: Excellent   CLINICAL DECISION MAKING: Stable/uncomplicated   EVALUATION COMPLEXITY: Low     GOALS: Goals reviewed with patient? Yes   SHORT TERM GOALS:   STG Name Target Date Goal status  1 Pt will report understanding and adherence to his HEP in order to promote independence in the management of her primary impairments. Baseline: HEP provided at eval 06/13/2021: Pt reports varied adherence to her HEP 06/18/2021 PROGRESSING    LONG TERM GOALS:    LTG Name Target Date Goal status  1 Pt will achieve a FOTO score of 64% in order to demonstrate improved functional ability as it relates to her foot pain. 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 Baseline: >4/10 pain with any amount of walking 07/16/2021 INITIAL  3 Pt will achieve BIL ankle dorsiflexion AROM >5 degrees in order to promote a WNL gait pattern. Baseline: See AROM chart in Objective findings 07/16/2021 INITIAL    PLAN: PT FREQUENCY: 1x/week   PT DURATION: 8 weeks   PLANNED INTERVENTIONS: Therapeutic exercises, Therapeutic activity, Neuro Muscular re-education, Balance training, Gait training, Patient/Family education, Joint mobilization, Dry Needling, Cryotherapy, Moist heat, Taping, Vasopneumatic device, and Manual therapy   PLAN FOR NEXT SESSION: Progress posterior tibialis reinforcement, calf stretching, weight bearing in good foot/ ankle alignment    Vanessa [place], PT, DPT 06/13/21 11:28 AM

## 2021-06-13 ENCOUNTER — Other Ambulatory Visit: Payer: Self-pay

## 2021-06-13 ENCOUNTER — Ambulatory Visit: Payer: BC Managed Care – PPO | Attending: Sports Medicine

## 2021-06-13 DIAGNOSIS — M6281 Muscle weakness (generalized): Secondary | ICD-10-CM | POA: Insufficient documentation

## 2021-06-13 DIAGNOSIS — M79672 Pain in left foot: Secondary | ICD-10-CM | POA: Diagnosis not present

## 2021-06-13 DIAGNOSIS — R262 Difficulty in walking, not elsewhere classified: Secondary | ICD-10-CM | POA: Insufficient documentation

## 2021-06-21 ENCOUNTER — Ambulatory Visit: Payer: BC Managed Care – PPO

## 2021-06-25 ENCOUNTER — Other Ambulatory Visit: Payer: Self-pay | Admitting: Family Medicine

## 2021-06-25 ENCOUNTER — Ambulatory Visit
Admission: RE | Admit: 2021-06-25 | Discharge: 2021-06-25 | Disposition: A | Discharge status: Home / Self Care | Payer: BC Managed Care – PPO | Source: Ambulatory Visit | Attending: Family Medicine | Admitting: Family Medicine

## 2021-06-25 DIAGNOSIS — R2232 Localized swelling, mass and lump, left upper limb: Secondary | ICD-10-CM

## 2021-06-25 DIAGNOSIS — R921 Mammographic calcification found on diagnostic imaging of breast: Secondary | ICD-10-CM | POA: Diagnosis not present

## 2021-06-25 DIAGNOSIS — N6489 Other specified disorders of breast: Secondary | ICD-10-CM | POA: Diagnosis not present

## 2021-06-25 DIAGNOSIS — R922 Inconclusive mammogram: Secondary | ICD-10-CM | POA: Diagnosis not present

## 2021-06-27 NOTE — Therapy (Signed)
OUTPATIENT PHYSICAL THERAPY TREATMENT NOTE   Patient Name: [name] MRN: [id number] DOB:August 19, 1965, 56 y.o., female Today's Date: 06/28/2021  PCP: Girtha Rm, PA-C REFERRING PROVIDER: Landis Martins, DPM   PT End of Session - 06/28/21 1218     Visit Number 4    Number of Visits 9    Date for PT Re-Evaluation 07/16/21    Authorization Type BCBS    Authorization Time Period FOTO v6, v10    PT Start Time 1217    PT Stop Time 1310   10 minutes of vasopneumatic treatment   PT Time Calculation (min) 53 min    Activity Tolerance Patient tolerated treatment well    Behavior During Therapy WFL for tasks assessed/performed               Past Medical History:  Diagnosis Date   Anemia    Encounter for routine gynecological examination    Dr. Charlesetta Garibaldi   Family history of breast cancer    Family history of lung cancer    History of blood transfusion 2009   d/t Hg ~4   Hx of adenomatous colonic polyps 04/21/2017   Hyperlipidemia 2014   Prediabetes    SVD (spontaneous vaginal delivery)    x 2   Ulcer as a child   gastric ulcer, unable to take aspirin   Uterine fibroid    Past Surgical History:  Procedure Laterality Date   BREAST BIOPSY Left 04/2019   ADH w/ calcs   BREAST BIOPSY Right 04/2019   fibrocystic change w/ calcs   BREAST EXCISIONAL BIOPSY Left 06/07/2019   atypical ductal hyperplasia (cm)    BREAST LUMPECTOMY WITH RADIOACTIVE SEED LOCALIZATION Left 06/07/2019   Procedure: LEFT BREAST LUMPECTOMY WITH RADIOACTIVE SEED LOCALIZATION;  Surgeon: Erroll Luna, MD;  Location: [facility];  Service: General;  Laterality: Left;   DILATATION & CURRETTAGE/HYSTEROSCOPY WITH RESECTOCOPE N/A 10/31/2014   Procedure: DILATATION & CURETTAGE/HYSTEROSCOPY WITH RESECTOCOPE, RESECTION OF POLYP WITH MYOSURE ;  Surgeon: Crawford Givens, MD;  Location: [facility];  Service: Gynecology;  Laterality: N/A;   ECTOPIC PREGNANCY SURGERY     MYOMECTOMY      TONSILLECTOMY  as a child   TUBAL LIGATION     WISDOM TOOTH EXTRACTION Bilateral    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   Uterine leiomyoma 12/10/2018   Foot pain, bilateral 12/02/2018   Chronic right-sided low back pain without sciatica 09/01/2018   Hx of adenomatous colonic polyps 04/21/2017   Prediabetes 02/18/2017   Routine general medical examination at a health care facility 07/09/2015   Smoker 07/09/2015   Hyperlipidemia 07/09/2015   23-polyvalent pneumococcal polysaccharide vaccine declined 07/09/2015   Influenza vaccination declined 07/09/2015   Screen for STD (sexually transmitted disease) 07/09/2015   Special screening for malignant neoplasms, colon 07/09/2015   Lateral epicondylitis of elbow 07/09/2015   Abdominal pain, epigastric 07/09/2015    REFERRING DIAG: M72.2 (ICD-10-CM) - Plantar fasciitis of left foot  THERAPY DIAG:  Pain in left foot  Muscle weakness (generalized)  Difficulty in walking, not elsewhere classified  SUBJECTIVE: Pt reports continued (7/10) Lt medial arch pain today. She reports only doing her HEP about 2 days per week.   PAIN:  Are you having pain?  Yes NPRS scale: 7/10 Pain location: Lt medial arch, plantar heel Pain description: sharp and aching  Aggravating factors: Standing after prolonged sitting; standing; walking; ascending/descending stairs Relieving factors: Diclofenac, rest     OBJECTIVE:  *Unless otherwise noted, objective information was collected previously*   DIAGNOSTIC FINDINGS: 12/06/2020 Xray, left foot Normal osseous mineralization. Joint spaces preserved. No fracture/dislocation/boney destruction.  Minimal calcaneal spur present with mild thickening of plantar fascia. No other soft tissue  abnormalities or radiopaque foreign bodies.    PATIENT SURVEYS:  FOTO 49%, projected 64% in 13 visits   COGNITION:          Overall cognitive status: Within functional limits for tasks assessed                        SENSATION:          Light touch: Appears intact             MUSCLE LENGTH: Gastrocnemius: Severe limitations BIL Soleus: Severe limitations BIL   OBSERVATION:  Pes cavus BIL   PALPATION: TTP to Lt plantar calcaneus, medial arch   LE AROM/PROM:   A/PROM Right 05/21/2021 Left 05/21/2021 Right 06/28/2021 Left 06/28/2021  Ankle dorsiflexion 0,5 -9,-2p! 3, 8 -1, 8  Ankle plantarflexion 42,45 46,46    Ankle inversion 16,34 21,40    Ankle eversion 16,22 10,25     (Blank rows = not tested)   LE MMT:   MMT Right 05/21/2021 Left 05/21/2021  Ankle dorsiflexion 5/5 5/5  Ankle plantarflexion 5/5 5/5  Ankle inversion 5/5 5/5  Ankle eversion 5/5 5/5   (Blank rows = not tested)   LOWER EXTREMITY SPECIAL TESTS:    Windlass Test: (-) on Lt   FUNCTIONAL TESTS:  Squat: WNL SLS x15 seconds: WNL on Rt, 1 step error on Lt Double leg heel raise x20: WNL Single leg heel raise x10: performed with increased ankle eversion BIL, pain on Lt   GAIT: X20 feet, increase BIL calcaneal eversion; pt walks primarily on lateral aspect of feet       TODAY'S TREATMENT:  [facility] Adult PT Treatment:                                                DATE: 06/28/2021 Therapeutic Exercise: Seated plantar fascia stretch x2 minutes on Lt Standing BIL gastroc slant board stretch x2 min Standing BIL soleus slant coard stretch x2 min DL heel raise on edge of Airex pad in // bars with 3-sec lowering 3x15 Woodpeckers/ arch blasters at wall 3x8 Standing great toe extension stretch on edge of Airex pad 2x30 sec Manual Therapy: N/A Neuromuscular re-ed: N/A Therapeutic Activity: N/A Modalities: Game Ready vasopneumatic treatment at 34 degrees F, x10 minutes to Left foot/ankle Self  Care: N/A   [facility] Adult PT Treatment:                                                DATE: 06/12/2021 Therapeutic Exercise: Long-sitting plantar fascia stretch with sheet 2x1 min Woodpeckers/ arch blasters at wall 3x8 Standing great toe extension stretch on towel roll 2x30 sec Heel/toe raises on Bosu ball 3x20 Forward lunge on Lt and Rt sides of BOSU ball in narrow stance  2x10 each BIL Eccentric heel raise/ slow lowering on edge of Airex pad 3x15 Gastroc stretch on slant board 2x1 min Manual Therapy: N/A Neuromuscular re-ed: N/A Therapeutic Activity: N/A Modalities: N/A Self Care: N/A   [facility] Adult PT Treatment:                                                DATE: 05/31/2021 Therapeutic Exercise: Standing soleus stretch on slant board with UE support x2 minutes Standing gastroc stretch on slant board with UE support x2 minutes Heel raises with tennis ball between ankles in // bars 2x10 with 3-sec holds Forward lunge on Lt and Rt sides of BOSU ball in narrow stance 2x10 each BIL Heel/toe rocks on Bosu ball in // bars 2x20 Manual Therapy: N/A  Neuromuscular re-ed: N/A Therapeutic Activity: N/A Modalities: N/A Self Care: N/A        PATIENT EDUCATION:  Education details: Educated on importance of daily adherence to HEP Person educated: Patient Education method: Consulting civil engineer, Media planner, and Handouts Education comprehension: verbalized understanding and returned demonstration     HOME EXERCISE PROGRAM: Access Code: [email or web address]   Exercises Standing heel raise WITH TENNIS BALL - 1 x daily - 7 x weekly - 3 sets - 10 reps Seated Plantar Fascia Stretch - 1 x daily - 7 x weekly - 2 sets - 36min hold Gastroc Stretch on Wall - 1 x daily - 7 x weekly - 2 sets - 71min hold Soleus Stretch on Wall - 1 x daily - 7 x weekly - 2 sets - 47min hold     ASSESSMENT:   CLINICAL IMPRESSION: Upon, re-assessment of ankle DF ROM, the pt has made excellent progress bilaterally, nearly  achieving her functional goal. She tolerated all exercises well today, demonstrating good form and no increase in pain. Pt reports therapeutic response to vasopneumatic treatment, reporting decrease in pain from 7/10 to 5/10 following treatment. She will continue to benefit from skilled PT to address her primary impairments and return to her prior level of function with less limitation.   REHAB POTENTIAL: Excellent   CLINICAL DECISION MAKING: Stable/uncomplicated   EVALUATION COMPLEXITY: Low     GOALS: Goals reviewed with patient? Yes   SHORT TERM GOALS:   STG Name Target Date Goal status  1 Pt will report understanding and adherence to his HEP in order to promote independence in the management of her primary impairments. Baseline: HEP provided at eval 06/13/2021: Pt reports varied adherence to her HEP 06/18/2021 PROGRESSING    LONG TERM GOALS:    LTG Name Target Date Goal status  1 Pt will achieve a FOTO score of 64% in order to demonstrate improved functional ability as it relates to her foot pain. 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 Baseline: >4/10 pain with any amount of walking 06/28/2021: Pt reports continued (4/10) pain with any amount of walking 07/16/2021 PROGRESSING  3 Pt will achieve BIL ankle dorsiflexion AROM >5 degrees in order to promote a WNL gait pattern. Baseline: See AROM chart in Objective findings 06/28/2021: 3 degrees on Rt, -1 degrees on Lt 07/16/2021 PROGRESSING    PLAN: PT FREQUENCY: 1x/week   PT DURATION: 8 weeks   PLANNED INTERVENTIONS: Therapeutic exercises, Therapeutic  activity, Neuro Muscular re-education, Balance training, Gait training, Patient/Family education, Joint mobilization, Dry Needling, Cryotherapy, Moist heat, Taping, Vasopneumatic device, and Manual therapy   PLAN FOR NEXT SESSION: Progress posterior tibialis  reinforcement, calf stretching, weight bearing in good foot/ ankle alignment    Vanessa [place], PT, DPT 06/28/21 1:45 PM

## 2021-06-28 ENCOUNTER — Ambulatory Visit: Payer: BC Managed Care – PPO

## 2021-06-28 ENCOUNTER — Other Ambulatory Visit: Payer: Self-pay

## 2021-06-28 DIAGNOSIS — M6281 Muscle weakness (generalized): Secondary | ICD-10-CM

## 2021-06-28 DIAGNOSIS — R262 Difficulty in walking, not elsewhere classified: Secondary | ICD-10-CM | POA: Diagnosis not present

## 2021-06-28 DIAGNOSIS — M79672 Pain in left foot: Secondary | ICD-10-CM | POA: Diagnosis not present

## 2021-07-02 ENCOUNTER — Other Ambulatory Visit: Payer: BC Managed Care – PPO

## 2021-07-05 ENCOUNTER — Other Ambulatory Visit: Payer: Self-pay

## 2021-07-05 ENCOUNTER — Ambulatory Visit
Admission: RE | Admit: 2021-07-05 | Discharge: 2021-07-05 | Disposition: A | Discharge status: Home / Self Care | Payer: BC Managed Care – PPO | Source: Ambulatory Visit | Attending: Family Medicine | Admitting: Family Medicine

## 2021-07-05 DIAGNOSIS — N6092 Unspecified benign mammary dysplasia of left breast: Secondary | ICD-10-CM | POA: Diagnosis not present

## 2021-07-05 DIAGNOSIS — R921 Mammographic calcification found on diagnostic imaging of breast: Secondary | ICD-10-CM

## 2021-07-11 ENCOUNTER — Ambulatory Visit: Payer: BC Managed Care – PPO | Admitting: Sports Medicine

## 2021-07-11 NOTE — Therapy (Incomplete)
OUTPATIENT PHYSICAL THERAPY TREATMENT NOTE   Patient Name: [name] MRN: [id number] DOB:[birth year], 56 y.o., female Today's Date: 07/11/2021  PCP: Girtha Rm, PA-C REFERRING PROVIDER: Landis Martins, DPM       Past Medical History:  Diagnosis Date   Anemia    Encounter for routine gynecological examination    Dr. Charlesetta Garibaldi   Family history of breast cancer    Family history of lung cancer    History of blood transfusion 2009   d/t Hg ~4   Hx of adenomatous colonic polyps 04/21/2017   Hyperlipidemia 2014   Prediabetes    SVD (spontaneous vaginal delivery)    x 2   Ulcer as a child   gastric ulcer, unable to take aspirin   Uterine fibroid    Past Surgical History:  Procedure Laterality Date   BREAST BIOPSY Left 04/2019   ADH w/ calcs   BREAST BIOPSY Right 04/2019   fibrocystic change w/ calcs   BREAST EXCISIONAL BIOPSY Left 06/07/2019   atypical ductal hyperplasia (cm)    BREAST LUMPECTOMY WITH RADIOACTIVE SEED LOCALIZATION Left 06/07/2019   Procedure: LEFT BREAST LUMPECTOMY WITH RADIOACTIVE SEED LOCALIZATION;  Surgeon: Erroll Luna, MD;  Location: [facility];  Service: General;  Laterality: Left;   DILATATION & CURRETTAGE/HYSTEROSCOPY WITH RESECTOCOPE N/A 10/31/2014   Procedure: [place], RESECTION OF POLYP WITH MYOSURE ;  Surgeon: Crawford Givens, MD;  Location: [facility];  Service: Gynecology;  Laterality: N/A;   ECTOPIC PREGNANCY SURGERY     MYOMECTOMY     TONSILLECTOMY  as a child   TUBAL LIGATION     WISDOM TOOTH EXTRACTION Bilateral    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   Uterine leiomyoma 12/10/2018   Foot pain, bilateral 12/02/2018   Chronic right-sided low back  pain without sciatica 09/01/2018   Hx of adenomatous colonic polyps 04/21/2017   Prediabetes 02/18/2017   Routine general medical examination at a health care facility 07/09/2015   Smoker 07/09/2015   Hyperlipidemia 07/09/2015   23-polyvalent pneumococcal polysaccharide vaccine declined 07/09/2015   Influenza vaccination declined 07/09/2015   Screen for STD (sexually transmitted disease) 07/09/2015   Special screening for malignant neoplasms, colon 07/09/2015   Lateral epicondylitis of elbow 07/09/2015   Abdominal pain, epigastric 07/09/2015    REFERRING DIAG: M72.2 (ICD-10-CM) - Plantar fasciitis of left foot  THERAPY DIAG:  No diagnosis found.  SUBJECTIVE: ***  PAIN:  Are you having pain? Yes NPRS scale: 7/10 Pain location: Lt medial arch, plantar heel Pain description: sharp and aching  Aggravating factors: Standing after prolonged sitting; standing; walking; ascending/descending stairs Relieving factors: Diclofenac, rest     OBJECTIVE:  *Unless otherwise noted, objective information was collected previously*   DIAGNOSTIC FINDINGS: 12/06/2020 Xray, left foot Normal osseous mineralization. Joint spaces preserved. No fracture/dislocation/boney destruction.  Minimal calcaneal spur present with mild thickening of plantar fascia. No other soft tissue abnormalities or radiopaque foreign bodies.    PATIENT SURVEYS:  FOTO 49%, projected 64% in 13 visits   COGNITION:          Overall cognitive status: Within functional limits for tasks assessed  SENSATION:          Light touch: Appears intact             MUSCLE LENGTH: Gastrocnemius: Severe limitations BIL Soleus: Severe limitations BIL   OBSERVATION:  Pes cavus BIL   PALPATION: TTP to Lt plantar calcaneus, medial arch   LE AROM/PROM:   A/PROM Right 05/21/2021 Left 05/21/2021 Right 06/28/2021 Left 06/28/2021  Ankle dorsiflexion 0,5 -9,-2p! 3, 8 -1, 8  Ankle plantarflexion 42,45 46,46     Ankle inversion 16,34 21,40    Ankle eversion 16,22 10,25     (Blank rows = not tested)   LE MMT:   MMT Right 05/21/2021 Left 05/21/2021  Ankle dorsiflexion 5/5 5/5  Ankle plantarflexion 5/5 5/5  Ankle inversion 5/5 5/5  Ankle eversion 5/5 5/5   (Blank rows = not tested)   LOWER EXTREMITY SPECIAL TESTS:    Windlass Test: (-) on Lt   FUNCTIONAL TESTS:  Squat: WNL SLS x15 seconds: WNL on Rt, 1 step error on Lt Double leg heel raise x20: WNL Single leg heel raise x10: performed with increased ankle eversion BIL, pain on Lt   GAIT: X20 feet, increase BIL calcaneal eversion; pt walks primarily on lateral aspect of feet       TODAY'S TREATMENT:  [facility] Adult PT Treatment:                                                DATE: 07/12/2021 Therapeutic Exercise: *** Manual Therapy: *** Neuromuscular re-ed: *** Therapeutic Activity: *** Modalities: *** Self Care: ***   [facility] Adult PT Treatment:                                                DATE: 06/28/2021 Therapeutic Exercise: Seated plantar fascia stretch x2 minutes on Lt Standing BIL gastroc slant board stretch x2 min Standing BIL soleus slant coard stretch x2 min DL heel raise on edge of Airex pad in // bars with 3-sec lowering 3x15 Woodpeckers/ arch blasters at wall 3x8 Standing great toe extension stretch on edge of Airex pad 2x30 sec Manual Therapy: N/A Neuromuscular re-ed: N/A Therapeutic Activity: N/A Modalities: Game Ready vasopneumatic treatment at 34 degrees F, x10 minutes to Left foot/ankle Self Care: N/A   [facility] Adult PT Treatment:                                                DATE: 06/12/2021 Therapeutic Exercise: Long-sitting plantar fascia stretch with sheet 2x1 min Woodpeckers/ arch blasters at wall 3x8 Standing great toe extension stretch on towel roll 2x30 sec Heel/toe raises on Bosu ball 3x20 Forward lunge on Lt and Rt sides of BOSU ball in narrow stance 2x10 each BIL Eccentric heel raise/  slow lowering on edge of Airex pad 3x15 Gastroc stretch on slant board 2x1 min Manual Therapy: N/A Neuromuscular re-ed: N/A Therapeutic Activity: N/A Modalities: N/A Self Care: N/A         PATIENT EDUCATION:  Education details: Educated on importance of daily adherence to HEP Person educated: Patient Education method: Consulting civil engineer, Media planner, and Handouts Education comprehension:  verbalized understanding and returned demonstration     HOME EXERCISE PROGRAM: Access Code: [email or web address]   Exercises Standing heel raise WITH TENNIS BALL - 1 x daily - 7 x weekly - 3 sets - 10 reps Seated Plantar Fascia Stretch - 1 x daily - 7 x weekly - 2 sets - 64min hold Gastroc Stretch on Wall - 1 x daily - 7 x weekly - 2 sets - 32min hold Soleus Stretch on Wall - 1 x daily - 7 x weekly - 2 sets - 46min hold     ASSESSMENT:   CLINICAL IMPRESSION: ***   REHAB POTENTIAL: Excellent   CLINICAL DECISION MAKING: Stable/uncomplicated   EVALUATION COMPLEXITY: Low     GOALS: Goals reviewed with patient? Yes   SHORT TERM GOALS:   STG Name Target Date Goal status  1 Pt will report understanding and adherence to his HEP in order to promote independence in the management of her primary impairments. Baseline: HEP provided at eval 06/13/2021: Pt reports varied adherence to her HEP 06/18/2021 PROGRESSING    LONG TERM GOALS:    LTG Name Target Date Goal status  1 Pt will achieve a FOTO score of 64% in order to demonstrate improved functional ability as it relates to her foot pain. 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 Baseline: >4/10 pain with any amount of walking 06/28/2021: Pt reports continued (4/10) pain with any amount of walking 07/16/2021 PROGRESSING  3 Pt will achieve BIL ankle dorsiflexion AROM >5 degrees in order to promote a WNL gait pattern. Baseline: See  AROM chart in Objective findings 06/28/2021: 3 degrees on Rt, -1 degrees on Lt 07/16/2021 PROGRESSING    PLAN: PT FREQUENCY: 1x/week   PT DURATION: 8 weeks   PLANNED INTERVENTIONS: Therapeutic exercises, Therapeutic activity, Neuro Muscular re-education, Balance training, Gait training, Patient/Family education, Joint mobilization, Dry Needling, Cryotherapy, Moist heat, Taping, Vasopneumatic device, and Manual therapy   PLAN FOR NEXT SESSION: Progress posterior tibialis reinforcement, calf stretching, weight bearing in good foot/ ankle alignment    Vanessa [place], PT, DPT 07/11/21 2:22 PM

## 2021-07-12 ENCOUNTER — Ambulatory Visit: Payer: BC Managed Care – PPO | Attending: Sports Medicine

## 2021-07-12 ENCOUNTER — Telehealth: Payer: Self-pay

## 2021-07-12 DIAGNOSIS — R262 Difficulty in walking, not elsewhere classified: Secondary | ICD-10-CM | POA: Insufficient documentation

## 2021-07-12 DIAGNOSIS — M79672 Pain in left foot: Secondary | ICD-10-CM | POA: Insufficient documentation

## 2021-07-12 DIAGNOSIS — M6281 Muscle weakness (generalized): Secondary | ICD-10-CM | POA: Insufficient documentation

## 2021-07-12 NOTE — Telephone Encounter (Signed)
Spoke with pt regarding her first no-show. 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 She was informed that this would not count as a no-show due to the circumstances. She confirmed her next appointment next week. ?

## 2021-07-15 ENCOUNTER — Ambulatory Visit: Payer: Self-pay | Admitting: Surgery

## 2021-07-15 DIAGNOSIS — N6092 Unspecified benign mammary dysplasia of left breast: Secondary | ICD-10-CM

## 2021-07-15 DIAGNOSIS — Z1239 Encounter for other screening for malignant neoplasm of breast: Secondary | ICD-10-CM | POA: Diagnosis not present

## 2021-07-18 ENCOUNTER — Encounter: Payer: Self-pay | Admitting: Sports Medicine

## 2021-07-18 ENCOUNTER — Ambulatory Visit (INDEPENDENT_AMBULATORY_CARE_PROVIDER_SITE_OTHER): Payer: BC Managed Care – PPO | Admitting: Sports Medicine

## 2021-07-18 ENCOUNTER — Other Ambulatory Visit: Payer: Self-pay

## 2021-07-18 DIAGNOSIS — M79672 Pain in left foot: Secondary | ICD-10-CM

## 2021-07-18 DIAGNOSIS — M722 Plantar fascial fibromatosis: Secondary | ICD-10-CM

## 2021-07-18 DIAGNOSIS — M216X2 Other acquired deformities of left foot: Secondary | ICD-10-CM

## 2021-07-18 NOTE — Patient Instructions (Signed)
If future if need f/u appt see Dr. Blenda Mounts or Dr. Valentina Lucks  ?

## 2021-07-18 NOTE — Progress Notes (Signed)
Subjective: ?[name] is a 56 y.o. female patient returns to office with complaint of heel pain on the left.  Patient reports that she still has pain states that it is better than before but still has pain when she gets up in the morning and states that she is doing physical therapy stretching and icing.  Patient denies any other pedal complaints at this time. ? ?Also admits to some left-sided hand pain especially at her thumb joint. ? ?Patient Active Problem List  ? Diagnosis Date Noted  ? 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 Family history of breast cancer   ? Family history of lung cancer   ? Herpes simplex 12/10/2018  ? History of anemia 12/10/2018  ? 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 Uterine leiomyoma 12/10/2018  ? Foot pain, bilateral 12/02/2018  ? Chronic right-sided low back pain without sciatica 09/01/2018  ? Hx of adenomatous colonic polyps 04/21/2017  ? Prediabetes 02/18/2017  ? Routine general medical examination at a health care facility 07/09/2015  ? Smoker 07/09/2015  ? Hyperlipidemia 07/09/2015  ? 23-polyvalent pneumococcal polysaccharide vaccine declined 07/09/2015  ? Influenza vaccination declined 07/09/2015  ? Screen for STD (sexually transmitted disease) 07/09/2015  ? Special screening for malignant neoplasms, colon 07/09/2015  ? Lateral epicondylitis of elbow 07/09/2015  ? Abdominal pain, epigastric 07/09/2015  ? ? ?Current Outpatient Medications on File Prior to Visit  ?Medication Sig Dispense Refill  ? simvastatin (ZOCOR) 40 MG tablet Take 1 tablet by mouth daily.    ? acetaminophen (TYLENOL) 500 MG tablet Take 1,000 mg by mouth as needed for mild pain, moderate pain, fever or headache. (Patient not taking: Reported on 05/29/2021)    ? diclofenac (VOLTAREN) 75 MG EC tablet Take 1 tablet (75 mg total) by mouth 2 (two) times daily. (Patient not taking: Reported on 05/29/2021) 30 tablet 0  ? methocarbamol (ROBAXIN) 500 MG tablet methocarbamol 500 mg tablet ? TAKE 1-2 TABLETS  (500-1,000 MG TOTAL) BY MOUTH EVERY 8 (EIGHT) HOURS AS NEEDED FOR MUSCLE SPASMS.    ? Multiple Vitamin (MULTIVITAMIN) capsule Take 1 capsule by mouth daily.    ? ondansetron (ZOFRAN) 4 MG tablet ondansetron HCl 4 mg tablet ? TAKE 1 TABLET BY MOUTH EVERY 6 HOURS.    ? simvastatin (ZOCOR) 40 MG tablet Take 1 tablet daily 90 tablet 1  ? ?No current facility-administered medications on file prior to visit.  ? ? ?Allergies  ?Allergen Reactions  ? Aspirin Nausea Only and Other (See Comments)  ?  Stomach cramping  ? Ibuprofen   ?  Pt stated, "Upsets stomach while I am on my cholesterol medicine"  ? ? ?Objective: ?Physical Exam ?General: The patient is alert and oriented x3 in no acute distress. ? ?Dermatology: Skin is warm, dry and supple bilateral lower extremities. Nails 1-10 are normal.  Integument unremarkable. ? ?Vascular: Dorsalis Pedis pulse and Posterior Tibial pulse are 2/4 bilateral. Capillary fill time is immediate to all digits. ? ?Neurological: Grossly intact to light touch bilateral. ? ?Musculoskeletal: There is mild tenderness to palpation at the medial calcaneal tubercale and through the insertion of the plantar fascia on the left foot. No pain with compression of calcaneus bilateral.  There is decreased Ankle joint range of motion bilateral worse on the left. All other joints range of motion within normal limits bilateral. Strength 5/5 in all groups bilateral.  ? ?Assessment and Plan: ?Problem List Items Addressed This Visit   ?None ?Visit Diagnoses   ? ? Plantar fasciitis of  left foot    -  Primary  ? Acquired equinus deformity of left foot      ? Pain in left foot      ? ?  ? ? ?-Complete examination performed.  ?-Re-discussed treatment for plantar fasciitis and equinus ?-Continue with diclofenac to take sparingly as needed for flareup of pain ?-Continue with physical therapy; recommend additional treatment of iontophoresis with topical dexamethasone and aquatic therapy ?-Continue with home exercises  taught by physical therapy ?-Continue with good supportive shoes daily for foot type ?-Recommend patient to continue with icing 1 to twice daily ?-Advised patient to follow-up with PCP regarding hand pain ?-Patient to return to office as needed or sooner if symptoms worsen. ? ?Landis Martins, DPM ? ? ?

## 2021-07-18 NOTE — Therapy (Incomplete)
OUTPATIENT PHYSICAL THERAPY TREATMENT NOTE   Patient Name: [name] MRN: [id number] DOB:1965/06/07, 56 y.o., female Today's Date: 07/18/2021  PCP: Girtha Rm, PA-C REFERRING PROVIDER: Landis Martins, DPM       Past Medical History:  Diagnosis Date   Anemia    Encounter for routine gynecological examination    Dr. Charlesetta Garibaldi   Family history of breast cancer    Family history of lung cancer    History of blood transfusion 2009   d/t Hg ~4   Hx of adenomatous colonic polyps 04/21/2017   Hyperlipidemia 2014   Prediabetes    SVD (spontaneous vaginal delivery)    x 2   Ulcer as a child   gastric ulcer, unable to take aspirin   Uterine fibroid    Past Surgical History:  Procedure Laterality Date   BREAST BIOPSY Left 04/2019   ADH w/ calcs   BREAST BIOPSY Right 04/2019   fibrocystic change w/ calcs   BREAST EXCISIONAL BIOPSY Left 06/07/2019   atypical ductal hyperplasia (cm)    BREAST LUMPECTOMY WITH RADIOACTIVE SEED LOCALIZATION Left 06/07/2019   Procedure: LEFT BREAST LUMPECTOMY WITH RADIOACTIVE SEED LOCALIZATION;  Surgeon: Erroll Luna, MD;  Location: [place];  Service: General;  Laterality: Left;   DILATATION & CURRETTAGE/HYSTEROSCOPY WITH RESECTOCOPE N/A 10/31/2014   Procedure: [place], RESECTION OF POLYP WITH MYOSURE ;  Surgeon: Crawford Givens, MD;  Location: [facility];  Service: Gynecology;  Laterality: N/A;   ECTOPIC PREGNANCY SURGERY     MYOMECTOMY     TONSILLECTOMY  as a child   TUBAL LIGATION     WISDOM TOOTH EXTRACTION Bilateral    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   Uterine leiomyoma 12/10/2018   Foot pain, bilateral 12/02/2018   Chronic right-sided low back  pain without sciatica 09/01/2018   Hx of adenomatous colonic polyps 04/21/2017   Prediabetes 02/18/2017   Routine general medical examination at a health care facility 07/09/2015   Smoker 07/09/2015   Hyperlipidemia 07/09/2015   23-polyvalent pneumococcal polysaccharide vaccine declined 07/09/2015   Influenza vaccination declined 07/09/2015   Screen for STD (sexually transmitted disease) 07/09/2015   Special screening for malignant neoplasms, colon 07/09/2015   Lateral epicondylitis of elbow 07/09/2015   Abdominal pain, epigastric 07/09/2015    REFERRING DIAG: M72.2 (ICD-10-CM) - Plantar fasciitis of left foot  THERAPY DIAG:  No diagnosis found.  SUBJECTIVE: ***  PAIN:  Are you having pain? Yes NPRS scale: 7/10 Pain location: Lt medial arch, plantar heel Pain description: sharp and aching  Aggravating factors: Standing after prolonged sitting; standing; walking; ascending/descending stairs Relieving factors: Diclofenac, rest     OBJECTIVE:  *Unless otherwise noted, objective information was collected previously*   DIAGNOSTIC FINDINGS: 12/06/2020 Xray, left foot Normal osseous mineralization. Joint spaces preserved. No fracture/dislocation/boney destruction.  Minimal calcaneal spur present with mild thickening of plantar fascia. No other soft tissue abnormalities or radiopaque foreign bodies.    PATIENT SURVEYS:  FOTO 49%, projected 64% in 13 visits   COGNITION:          Overall cognitive status: Within functional limits for tasks assessed  SENSATION:          Light touch: Appears intact             MUSCLE LENGTH: Gastrocnemius: Severe limitations BIL Soleus: Severe limitations BIL   OBSERVATION:  Pes cavus BIL   PALPATION: TTP to Lt plantar calcaneus, medial arch   LE AROM/PROM:   A/PROM Right 05/21/2021 Left 05/21/2021 Right 06/28/2021 Left 06/28/2021  Ankle dorsiflexion 0,5 -9,-2p! 3, 8 -1, 8  Ankle plantarflexion 42,45 46,46     Ankle inversion 16,34 21,40    Ankle eversion 16,22 10,25     (Blank rows = not tested)   LE MMT:   MMT Right 05/21/2021 Left 05/21/2021  Ankle dorsiflexion 5/5 5/5  Ankle plantarflexion 5/5 5/5  Ankle inversion 5/5 5/5  Ankle eversion 5/5 5/5   (Blank rows = not tested)   LOWER EXTREMITY SPECIAL TESTS:    Windlass Test: (-) on Lt   FUNCTIONAL TESTS:  Squat: WNL SLS x15 seconds: WNL on Rt, 1 step error on Lt Double leg heel raise x20: WNL Single leg heel raise x10: performed with increased ankle eversion BIL, pain on Lt   GAIT: X20 feet, increase BIL calcaneal eversion; pt walks primarily on lateral aspect of feet       TODAY'S TREATMENT:  [facility] Adult PT Treatment:                                                DATE: 07/19/2021 Therapeutic Exercise: *** Manual Therapy: *** Neuromuscular re-ed: *** Therapeutic Activity: *** Modalities: *** Self Care: ***   [facility] Adult PT Treatment:                                                DATE: 06/28/2021 Therapeutic Exercise: Seated plantar fascia stretch x2 minutes on Lt Standing BIL gastroc slant board stretch x2 min Standing BIL soleus slant coard stretch x2 min DL heel raise on edge of Airex pad in // bars with 3-sec lowering 3x15 Woodpeckers/ arch blasters at wall 3x8 Standing great toe extension stretch on edge of Airex pad 2x30 sec Manual Therapy: N/A Neuromuscular re-ed: N/A Therapeutic Activity: N/A Modalities: Game Ready vasopneumatic treatment at 34 degrees F, x10 minutes to Left foot/ankle Self Care: N/A   [facility] Adult PT Treatment:                                                DATE: 06/12/2021 Therapeutic Exercise: Long-sitting plantar fascia stretch with sheet 2x1 min Woodpeckers/ arch blasters at wall 3x8 Standing great toe extension stretch on towel roll 2x30 sec Heel/toe raises on Bosu ball 3x20 Forward lunge on Lt and Rt sides of BOSU ball in narrow stance 2x10 each BIL Eccentric heel raise/  slow lowering on edge of Airex pad 3x15 Gastroc stretch on slant board 2x1 min Manual Therapy: N/A Neuromuscular re-ed: N/A Therapeutic Activity: N/A Modalities: N/A Self Care: N/A         PATIENT EDUCATION:  Education details: Educated on importance of daily adherence to HEP Person educated: Patient Education method: Consulting civil engineer, Media planner, and Handouts Education comprehension:  verbalized understanding and returned demonstration     HOME EXERCISE PROGRAM: Access Code: [email or web address]   Exercises Standing heel raise WITH TENNIS BALL - 1 x daily - 7 x weekly - 3 sets - 10 reps Seated Plantar Fascia Stretch - 1 x daily - 7 x weekly - 2 sets - 40mn hold Gastroc Stretch on Wall - 1 x daily - 7 x weekly - 2 sets - 19m hold Soleus Stretch on Wall - 1 x daily - 7 x weekly - 2 sets - 36m4mhold     ASSESSMENT:   CLINICAL IMPRESSION: ***   REHAB POTENTIAL: Excellent   CLINICAL DECISION MAKING: Stable/uncomplicated   EVALUATION COMPLEXITY: Low     GOALS: Goals reviewed with patient? Yes   SHORT TERM GOALS:   STG Name Target Date Goal status  1 Pt will report understanding and adherence to his HEP in order to promote independence in the management of her primary impairments. Baseline: HEP provided at eval 06/13/2021: Pt reports varied adherence to her HEP 06/18/2021 PROGRESSING    LONG TERM GOALS:    LTG Name Target Date Goal status  1 Pt will achieve a FOTO score of 64% in order to demonstrate improved functional ability as it relates to her foot pain. 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 Baseline: >4/10 pain with any amount of walking 06/28/2021: Pt reports continued (4/10) pain with any amount of walking 07/16/2021 PROGRESSING  3 Pt will achieve BIL ankle dorsiflexion AROM >5 degrees in order to promote a WNL gait pattern. Baseline: See  AROM chart in Objective findings 06/28/2021: 3 degrees on Rt, -1 degrees on Lt 07/16/2021 PROGRESSING    PLAN: PT FREQUENCY: 1x/week   PT DURATION: 8 weeks   PLANNED INTERVENTIONS: Therapeutic exercises, Therapeutic activity, Neuro Muscular re-education, Balance training, Gait training, Patient/Family education, Joint mobilization, Dry Needling, Cryotherapy, Moist heat, Taping, Vasopneumatic device, and Manual therapy   PLAN FOR NEXT SESSION: Progress posterior tibialis reinforcement, calf stretching, weight bearing in good foot/ ankle alignment    YarVanessa DurhamT, DPT 07/18/21 1:48 PM

## 2021-07-19 ENCOUNTER — Telehealth: Payer: Self-pay

## 2021-07-19 ENCOUNTER — Telehealth: Payer: Self-pay | Admitting: Licensed Clinical Social Worker

## 2021-07-19 ENCOUNTER — Ambulatory Visit: Payer: BC Managed Care – PPO

## 2021-07-19 NOTE — Telephone Encounter (Signed)
Spoke with pt regarding her first no-show appointment. She reports she forgot and will call the front to schedule her next appointment. Discussed the clinic attendance policy, to which she reports understanding. ?

## 2021-07-19 NOTE — Telephone Encounter (Signed)
Scheduled appt per 3/9 referral. Pt is aware of appt date and time. Pt is aware to arrive 15 mins prior to appt time and to bring and updated insurance card. Pt is aware of appt location.   ?

## 2021-07-22 ENCOUNTER — Inpatient Hospital Stay: Payer: BC Managed Care – PPO | Attending: Sports Medicine | Admitting: Licensed Clinical Social Worker

## 2021-07-22 ENCOUNTER — Other Ambulatory Visit: Payer: Self-pay

## 2021-07-22 ENCOUNTER — Inpatient Hospital Stay: Payer: BC Managed Care – PPO

## 2021-07-22 DIAGNOSIS — Z801 Family history of malignant neoplasm of trachea, bronchus and lung: Secondary | ICD-10-CM

## 2021-07-22 DIAGNOSIS — Z803 Family history of malignant neoplasm of breast: Secondary | ICD-10-CM

## 2021-07-22 DIAGNOSIS — Z8049 Family history of malignant neoplasm of other genital organs: Secondary | ICD-10-CM | POA: Diagnosis not present

## 2021-07-22 NOTE — Progress Notes (Signed)
REFERRING PROVIDER: Erroll Luna, MD [address] [place],  [place] [address]  PRIMARY PROVIDER:  Raenette Rover, Laurian Brim, PA-C  PRIMARY REASON FOR VISIT:  1. Family history of breast cancer   2. Family history of lung cancer      HISTORY OF PRESENT ILLNESS:   [name], a 56 y.o. female, was seen for a [facility] cancer genetics consultation at the request of Dr. Brantley Stage due to a family history of cancer and personal history of atypical ductal hyperplasia.  [name] presents to clinic today to discuss the possibility of a hereditary predisposition to cancer, genetic testing, and to further clarify her future cancer risks, as well as potential cancer risks for family members.   [name] is a 56 y.o. female with no personal history of cancer. A breast biopsy in 2021 revealed atypical ductal hyperplasia, she had lumpectomy 06/07/2019. She saw [place] for genetic counseling at that time and declined genetic testing. A recent breast biopsy in 2023 revealed atypical ductal hyperplasia which she plans lumpectomy for.   CANCER HISTORY:  Oncology History   No history exists.     RISK FACTORS:  Menarche was at age 56.  First live birth at age 56.  OCP use for approximately 14 years.  Ovaries intact: yes.  Hysterectomy: no.  Menopausal status: postmenopausal.  HRT use: 0 years. Colonoscopy: yes; reports polyps, unsure how many, 5 year recal, Mammogram within the last year: yes. Up to date with pelvic exams: yes.  Past Medical History:  Diagnosis Date   Anemia    Encounter for routine gynecological examination    Dr. Charlesetta Garibaldi   Family history of breast cancer    Family history of lung cancer    History of blood transfusion 2009   d/t Hg ~4   Hx of adenomatous colonic polyps 04/21/2017   Hyperlipidemia 2014   Prediabetes    SVD (spontaneous vaginal delivery)    x 2   Ulcer as a child   gastric ulcer, unable to take aspirin   Uterine fibroid      Past Surgical History:  Procedure Laterality Date   BREAST BIOPSY Left 04/2019   ADH w/ calcs   BREAST BIOPSY Right 04/2019   fibrocystic change w/ calcs   BREAST EXCISIONAL BIOPSY Left 06/07/2019   atypical ductal hyperplasia (cm)    BREAST LUMPECTOMY WITH RADIOACTIVE SEED LOCALIZATION Left 06/07/2019   Procedure: LEFT BREAST LUMPECTOMY WITH RADIOACTIVE SEED LOCALIZATION;  Surgeon: Erroll Luna, MD;  Location: [facility];  Service: General;  Laterality: Left;   DILATATION & CURRETTAGE/HYSTEROSCOPY WITH RESECTOCOPE N/A 10/31/2014   Procedure: DILATATION & CURETTAGE/HYSTEROSCOPY WITH RESECTOCOPE, RESECTION OF POLYP WITH MYOSURE ;  Surgeon: Crawford Givens, MD;  Location: [facility];  Service: Gynecology;  Laterality: N/A;   ECTOPIC PREGNANCY SURGERY     MYOMECTOMY     TONSILLECTOMY  as a child   TUBAL LIGATION     WISDOM TOOTH EXTRACTION Bilateral     Social History   Socioeconomic History   Marital status: Married    Spouse name: Not on file   Number of children: Not on file   Years of education: Not on file   Highest education level: Not on file  Occupational History   Not on file  Tobacco Use   Smoking status: Former    Packs/day: 0.25    Years: 36.00    Pack years: 9.00    Types: Cigarettes    Quit date: 08/2018  Years since quitting: 2.9   Smokeless tobacco: Never  Vaping Use   Vaping Use: Never used  Substance and Sexual Activity   Alcohol use: Yes    Comment: 2x per mo   Drug use: No   Sexual activity: Yes    Birth control/protection: Surgical  Other Topics Concern   Not on file  Social History Narrative   Married, has 3 grandchildren, has 2 daughters (2 and 47), 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    Social Determinants of Health   Financial Resource Strain: Not on file  Food Insecurity: Not on file  Transportation Needs: Not on file  Physical  Activity: Not on file  Stress: Not on file  Social Connections: Not on file     FAMILY HISTORY:  We obtained a detailed, 4-generation family history.  Significant diagnoses are listed below: Family History  Problem Relation Age of Onset   Hypertension Mother    Hepatitis Mother        Hepatitis C   Asthma Sister    Breast cancer Sister 58   Diabetes Maternal Grandmother        leg amputation   Heart disease Maternal Grandmother    Hypertension Maternal Grandmother    Stroke Maternal Aunt 50       2   Cirrhosis Maternal Aunt    Cancer Paternal Aunt        lung cancer   Breast cancer Cousin        first cousins once removed x2   Breast cancer Other        mat great aunts x3   Colon cancer Neg Hx    Rectal cancer Neg Hx    Stomach cancer Neg Hx    Colon polyps Neg Hx    [name] has 2 daughters, no cancers. She has 1 maternal half brother, 1 maternal half sister. Her sister was diagnosed with breast cancer last year at 13.  [name] mother is living at 46 and has no history of cancer. Patient had 3 maternal aunts and 2 maternal uncles. One aunt had cervical cancer. No known cancers in first cousins. Maternal grandmother passed in her 8s due to kidney issues. This grandmother had 3 sisters who all passed from breast cancer, all diagnosed older than age 91. 2 of these sisters also had daughters who had breast cancer, and one sister had a son who had cancer but patient is unsure the type. Patient's maternal grandfather is deceased, no info.    [name] father was killed when patient was 66 months old, she has limited information about this side of the family. Her paternal aunt had lung cancer. No other known cancers on this side of the family.   [name] is unaware of previous family history of genetic testing for hereditary cancer risks. Patient's maternal ancestors are of African American descent, and paternal ancestors are of African  American descent. There is no reported Ashkenazi Jewish ancestry. There is no known consanguinity.     GENETIC COUNSELING ASSESSMENT: [name] is a 56 y.o. female with a family history of breast cancer which is somewhat suggestive of a hereditary cancer syndrome and predisposition to cancer. We, therefore, discussed and recommended the following at today's visit.   DISCUSSION: We discussed that 10% of breast cancer is hereditary, with most cases associated with BRCA1/BRCA2 genes.  There are other genes that  can be associated with hereditary breast cancer syndromes.  We discussed that testing is beneficial for several reasons including knowing how to follow individuals for cancer screenings, and understand if other family members could be at risk for cancer and allow them to undergo genetic testing.    We reviewed the characteristics, features and inheritance patterns of hereditary cancer syndromes. We also discussed genetic testing, including the appropriate family members to test, the process of testing, insurance coverage and turn-around-time for results. We discussed the implications of a negative, positive and/or variant of uncertain significant result. We recommended [name] pursue genetic testing for the [facility] CancerNext-Expanded+RNA panel.   The CancerNext-Expanded + RNAinsight gene panel offered by [employer] and includes sequencing and rearrangement analysis for the following 77 genes: IP, ALK, APC*, ATM*, AXIN2, BAP1, BARD1, BLM, BMPR1A, BRCA1*, BRCA2*, BRIP1*, CDC73, CDH1*,CDK4, CDKN1B, CDKN2A, CHEK2*, CTNNA1, DICER1, FANCC, FH, FLCN, GALNT12, KIF1B, LZTR1, MAX, MEN1, MET, MLH1*, MSH2*, MSH3, MSH6*, MUTYH*, NBN, NF1*, NF2, NTHL1, PALB2*, PHOX2B, PMS2*, POT1, PRKAR1A, PTCH1, PTEN*, RAD51C*, RAD51D*,RB1, RECQL, RET, SDHA, SDHAF2, SDHB, SDHC, SDHD, SMAD4, SMARCA4, SMARCB1, SMARCE1, STK11, SUFU, TMEM127, TP53*,TSC1, TSC2, VHL and XRCC2 (sequencing and  deletion/duplication); EGFR, EGLN1, HOXB13, KIT, MITF, PDGFRA, POLD1 and POLE (sequencing only); EPCAM and GREM1 (deletion/duplication only).  Based on [name]'s family history of cancer, she meets medical criteria for genetic testing. Despite that she meets criteria, she may still have an out of pocket cost. We discussed that if her out of pocket cost for testing is over $100, the laboratory will call and confirm whether she wants to proceed with testing.  If the out of pocket cost of testing is less than $100 she will be billed by the genetic testing laboratory.   PLAN: After considering the risks, benefits, and limitations, [name] provided informed consent to pursue genetic testing and the blood sample was sent to [facility] for analysis of the CancerNext-Expanded+RNA panel. Results should be available within approximately 2-3 weeks' time, at which point they will be disclosed by telephone to [name], as will any additional recommendations warranted by these results. [name] will receive a summary of her genetic counseling visit and a copy of her results once available. This information will also be available in Epic.   [name] questions were answered to her satisfaction today. Our contact information was provided should additional questions or concerns arise. Thank you for the referral and allowing [place] to share in the care of your patient.   Faith Rogue, MS, [facility] Genetic Counselor [email or web address] Phone: [phone]  The patient was seen for a total of 20 minutes in face-to-face genetic counseling.  Patient was seen with her daughter. Dr. Grayland Ormond was available for discussion regarding this case.   _______________________________________________________________________ For Office Staff:  Number of people involved in session: 2 Was an Intern/ student involved with case: no

## 2021-07-26 NOTE — Therapy (Signed)
?OUTPATIENT PHYSICAL THERAPY TREATMENT NOTE/ RE-CERTIFICATION/ RE_EVALUATION ? ? ?Patient Name: [name] ?MRN: [id number] ?DOB:03-15-1966, 56 y.o., female ?Today's Date: 07/27/2021 ? ?PCP: Irene Pap, PA-C ?REFERRING PROVIDER: Landis Martins, DPM ? ? PT End of Session - 07/27/21 1116   ? ? Visit Number 5   ? Number of Visits 9   ? Date for PT Re-Evaluation 09/07/21   ? Authorization Type BCBS   ? Authorization Time Period FOTO v6, v10   ? PT Start Time 1118   ? PT Stop Time 5009   ? PT Time Calculation (min) 40 min   ? Activity Tolerance Patient tolerated treatment well   ? Behavior During Therapy [facility] for tasks assessed/performed   ? ?  ?  ? ?  ? ? ? ? ? ?Past Medical History:  ?Diagnosis Date  ? Anemia   ? Encounter for routine gynecological examination   ? Dr. Charlesetta Garibaldi  ? Family history of breast cancer   ? Family history of lung cancer   ? History of blood transfusion 2009  ? d/t Hg ~4  ? Hx of adenomatous colonic polyps 04/21/2017  ? Hyperlipidemia 2014  ? Prediabetes   ? SVD (spontaneous vaginal delivery)   ? x 2  ? Ulcer as a child  ? gastric ulcer, unable to take aspirin  ? Uterine fibroid   ? ?Past Surgical History:  ?Procedure Laterality Date  ? BREAST BIOPSY Left 04/2019  ? ADH w/ calcs  ? BREAST BIOPSY Right 04/2019  ? fibrocystic change w/ calcs  ? BREAST EXCISIONAL BIOPSY Left 06/07/2019  ? atypical ductal hyperplasia (cm)   ? BREAST LUMPECTOMY WITH RADIOACTIVE SEED LOCALIZATION Left 06/07/2019  ? Procedure: LEFT BREAST LUMPECTOMY WITH RADIOACTIVE SEED LOCALIZATION;  Surgeon: Erroll Luna, MD;  Location: [facility];  Service: General;  Laterality: Left;  ? DILATATION & CURRETTAGE/HYSTEROSCOPY WITH RESECTOCOPE N/A 10/31/2014  ? Procedure: DILATATION & CURETTAGE/HYSTEROSCOPY WITH RESECTOCOPE, RESECTION OF POLYP WITH MYOSURE ;  Surgeon: Crawford Givens, MD;  Location: [facility];  Service: Gynecology;  Laterality: N/A;  ? ECTOPIC PREGNANCY SURGERY    ? MYOMECTOMY    ?  TONSILLECTOMY  as a child  ? TUBAL LIGATION    ? WISDOM TOOTH EXTRACTION Bilateral   ? ?Patient Active Problem List  ? Diagnosis Date Noted  ? 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 Family history of breast cancer   ? Family history of lung cancer   ? Herpes simplex 12/10/2018  ? History of anemia 12/10/2018  ? 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 Uterine leiomyoma 12/10/2018  ? Foot pain, bilateral 12/02/2018  ? Chronic right-sided low back pain without sciatica 09/01/2018  ? Hx of adenomatous colonic polyps 04/21/2017  ? Prediabetes 02/18/2017  ? Routine general medical examination at a health care facility 07/09/2015  ? Smoker 07/09/2015  ? Hyperlipidemia 07/09/2015  ? 23-polyvalent pneumococcal polysaccharide vaccine declined 07/09/2015  ? Influenza vaccination declined 07/09/2015  ? Screen for STD (sexually transmitted disease) 07/09/2015  ? Special screening for malignant neoplasms, colon 07/09/2015  ? Lateral epicondylitis of elbow 07/09/2015  ? Abdominal pain, epigastric 07/09/2015  ? ? ?REFERRING DIAG: M72.2 (ICD-10-CM) - Plantar fasciitis of left foot ? ?THERAPY DIAG:  ?Pain in left foot ? ?Muscle weakness (generalized) ? ?Difficulty in walking, not elsewhere classified ? ?SUBJECTIVE: Pt reports continued 3/10 L medial arch/ plantar heel pain today. She adds that she has been doing her HEP regularly. ? ?PAIN:  ?Are you having pain? Yes ?NPRS scale:  3/10 ?Pain location: Lt medial arch, plantar heel ?Pain description: sharp and aching  ?Aggravating factors: Standing after prolonged sitting; standing; walking; ascending/descending stairs ?Relieving factors: Diclofenac, rest ? ? ? ? ?OBJECTIVE:  ?*Unless otherwise noted, objective information was collected previously*  ? ?DIAGNOSTIC FINDINGS: 12/06/2020 Xray, left foot ?Normal osseous mineralization. Joint spaces preserved. No fracture/dislocation/boney destruction.  Minimal calcaneal spur present with mild thickening of plantar fascia. No other soft tissue  abnormalities or radiopaque foreign bodies.  ?  ?PATIENT SURVEYS:  ?FOTO 49%, projected 64% in 13 visits ?  ?COGNITION: ?         Overall cognitive status: Within functional limits for tasks assessed              ?          ?SENSATION: ?         Light touch: Appears intact ?          ?  ?MUSCLE LENGTH: ?Gastrocnemius: Severe limitations BIL ?Soleus: Severe limitations BIL ?  ?OBSERVATION:  ?Pes cavus BIL ?  ?PALPATION: ?TTP to Lt plantar calcaneus, medial arch ?  ?LE AROM/PROM: ?  ?A/PROM Right ?05/21/2021 Left ?05/21/2021 Right ?06/28/2021 Left ?06/28/2021 Right ?07/27/2021 Left ?07/27/2021  ?Ankle dorsiflexion 0,5 -9,-2p! 3, 8 -1, 8 4 AROM 2 AROM  ?Ankle plantarflexion 42,45 46,46      ?Ankle inversion 16,34 21,40      ?Ankle eversion 16,22 10,25      ? (Blank rows = not tested) ?  ?LE MMT: ?  ?MMT Right ?05/21/2021 Left ?05/21/2021  ?Ankle dorsiflexion 5/5 5/5  ?Ankle plantarflexion 5/5 5/5  ?Ankle inversion 5/5 5/5  ?Ankle eversion 5/5 5/5  ? (Blank rows = not tested) ?  ?LOWER EXTREMITY SPECIAL TESTS:  ?  ?Windlass Test: (-) on Lt ?  ?FUNCTIONAL TESTS:  ?Squat: WNL ?SLS x15 seconds: WNL on Rt, 1 step error on Lt ?Double leg heel raise x20: WNL ?Single leg heel raise x10: performed with increased ankle eversion BIL, pain on Lt ?  ?GAIT: ?X20 feet, increase BIL calcaneal eversion; pt walks primarily on lateral aspect of feet ?  ?  ?  ?TODAY'S TREATMENT: ? ?[facility] Adult PT Treatment:                                                DATE: 07/27/2021 ?Therapeutic Exercise: ?Bouncing heel raises on Cybex leg press machine 3x20 ?Standing gastroc slant board stretch x2 minutes BIL ?SLS with heel on Airex pad and handing off 5# kettlebell side to side 3x10 hand-offs ? ?Manual Therapy: ?IASTM to Lt plantar fascia x10 minutes with pt education on side effects/ efficacy of treatment ?Neuromuscular re-ed: ?N/A ?Therapeutic Activity: ?N/A ?Modalities: ?Iontophoresis 6-hour patch with '4mg'$ /ml dexamethasone; pt instruction for use,  including side effects and efficacy of treatment ?Self Care: ?N/A ? ? ?[facility] Adult PT Treatment:                                                DATE: 06/28/2021 ?Therapeutic Exercise: ?Seated plantar fascia stretch x2 minutes on Lt ?Standing BIL gastroc slant board stretch x2 min ?Standing BIL soleus slant coard stretch x2 min ?DL heel raise on edge of Airex pad in // bars with  3-sec lowering 3x15 ?Woodpeckers/ arch blasters at wall 3x8 ?Standing great toe extension stretch on edge of Airex pad 2x30 sec ?Manual Therapy: ?N/A ?Neuromuscular re-ed: ?N/A ?Therapeutic Activity: ?N/A ?Modalities: ?Game Ready vasopneumatic treatment at 34 degrees F, x10 minutes to Left foot/ankle ?Self Care: ?N/A ? ? ?[facility] Adult PT Treatment:                                                DATE: 06/12/2021 ?Therapeutic Exercise: ?Long-sitting plantar fascia stretch with sheet 2x1 min ?Woodpeckers/ arch blasters at wall 3x8 ?Standing great toe extension stretch on towel roll 2x30 sec ?Heel/toe raises on Bosu ball 3x20 ?Forward lunge on Lt and Rt sides of BOSU ball in narrow stance 2x10 each BIL ?Eccentric heel raise/ slow lowering on edge of Airex pad 3x15 ?Gastroc stretch on slant board 2x1 min ?Manual Therapy: ?N/A ?Neuromuscular re-ed: ?N/A ?Therapeutic Activity: ?N/A ?Modalities: ?N/A ?Self Care: ?N/A ? ? ?  ?  ?  ?PATIENT EDUCATION:  ?Education details: Educated on importance of daily adherence to HEP ?Person educated: Patient ?Education method: Explanation, Demonstration, and Handouts ?Education comprehension: verbalized understanding and returned demonstration ?  ?  ?HOME EXERCISE PROGRAM: ?Access Code: [email or web address] ?  ?Exercises ?Standing heel raise WITH TENNIS BALL - 1 x daily - 7 x weekly - 3 sets - 10 reps ?Seated Plantar Fascia Stretch - 1 x daily - 7 x weekly - 2 sets - 16mn hold ?Gastroc Stretch on Wall - 1 x daily - 7 x weekly - 2 sets - 164m hold ?Soleus Stretch on Wall - 1 x daily - 7 x weekly - 2 sets - 42m38mhold ?  ?   ?ASSESSMENT: ?  ?CLINICAL IMPRESSION: ?Pt responded well to all interventions today. IASTM was performed to the Lt plantar fascia prior to administration of iontophoresis patch. Pt reports understanding of common side effects an

## 2021-07-27 ENCOUNTER — Other Ambulatory Visit: Payer: Self-pay

## 2021-07-27 ENCOUNTER — Ambulatory Visit: Payer: BC Managed Care – PPO

## 2021-07-27 DIAGNOSIS — R262 Difficulty in walking, not elsewhere classified: Secondary | ICD-10-CM | POA: Diagnosis not present

## 2021-07-27 DIAGNOSIS — M79672 Pain in left foot: Secondary | ICD-10-CM | POA: Diagnosis not present

## 2021-07-27 DIAGNOSIS — M6281 Muscle weakness (generalized): Secondary | ICD-10-CM | POA: Diagnosis not present

## 2021-07-31 NOTE — Therapy (Addendum)
?OUTPATIENT PHYSICAL THERAPY TREATMENT NOTE/ RE-CERTIFICATION/ RE-EVALUATION/ DISCHARGE SUMMARY ? ? ?Patient Name: [name] ?MRN: [id number] ?DOB:[birth year], 56 y.o., female ?Today's Date: 08/01/2021 ? ?PCP: Irene Pap, PA-C ?REFERRING PROVIDER: Irene Pap, PA-C ? ? PT End of Session - 08/01/21 1540   ? ? Visit Number 6   ? Number of Visits 9   ? Date for PT Re-Evaluation 09/07/21   ? Authorization Type BCBS   ? Authorization Time Period FOTO v6, v10   ? PT Start Time 1537   Pt arrived 7 minutes late to her appointment.  ? PT Stop Time 1615   ? PT Time Calculation (min) 38 min   ? Activity Tolerance Patient tolerated treatment well   ? Behavior During Therapy [facility] for tasks assessed/performed   ? ?  ?  ? ?  ? ? ? ? ? ? ?Past Medical History:  ?Diagnosis Date  ? Anemia   ? Encounter for routine gynecological examination   ? Dr. Charlesetta Garibaldi  ? Family history of breast cancer   ? Family history of lung cancer   ? History of blood transfusion 2009  ? d/t Hg ~4  ? Hx of adenomatous colonic polyps 04/21/2017  ? Hyperlipidemia 2014  ? Prediabetes   ? SVD (spontaneous vaginal delivery)   ? x 2  ? Ulcer as a child  ? gastric ulcer, unable to take aspirin  ? Uterine fibroid   ? ?Past Surgical History:  ?Procedure Laterality Date  ? BREAST BIOPSY Left 04/2019  ? ADH w/ calcs  ? BREAST BIOPSY Right 04/2019  ? fibrocystic change w/ calcs  ? BREAST EXCISIONAL BIOPSY Left 06/07/2019  ? atypical ductal hyperplasia (cm)   ? BREAST LUMPECTOMY WITH RADIOACTIVE SEED LOCALIZATION Left 06/07/2019  ? Procedure: LEFT BREAST LUMPECTOMY WITH RADIOACTIVE SEED LOCALIZATION;  Surgeon: Erroll Luna, MD;  Location: [facility];  Service: General;  Laterality: Left;  ? DILATATION & CURRETTAGE/HYSTEROSCOPY WITH RESECTOCOPE N/A 10/31/2014  ? Procedure: DILATATION & CURETTAGE/HYSTEROSCOPY WITH RESECTOCOPE, RESECTION OF POLYP WITH MYOSURE ;  Surgeon: Crawford Givens, MD;  Location: [facility];  Service: Gynecology;   Laterality: N/A;  ? ECTOPIC PREGNANCY SURGERY    ? MYOMECTOMY    ? TONSILLECTOMY  as a child  ? TUBAL LIGATION    ? WISDOM TOOTH EXTRACTION Bilateral   ? ?Patient Active Problem List  ? Diagnosis Date Noted  ? 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 Family history of breast cancer   ? Family history of lung cancer   ? Herpes simplex 12/10/2018  ? History of anemia 12/10/2018  ? 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 Uterine leiomyoma 12/10/2018  ? Foot pain, bilateral 12/02/2018  ? Chronic right-sided low back pain without sciatica 09/01/2018  ? Hx of adenomatous colonic polyps 04/21/2017  ? Prediabetes 02/18/2017  ? Routine general medical examination at a health care facility 07/09/2015  ? Smoker 07/09/2015  ? Hyperlipidemia 07/09/2015  ? 23-polyvalent pneumococcal polysaccharide vaccine declined 07/09/2015  ? Influenza vaccination declined 07/09/2015  ? Screen for STD (sexually transmitted disease) 07/09/2015  ? Special screening for malignant neoplasms, colon 07/09/2015  ? Lateral epicondylitis of elbow 07/09/2015  ? Abdominal pain, epigastric 07/09/2015  ? ? ?REFERRING DIAG: M72.2 (ICD-10-CM) - Plantar fasciitis of left foot ? ?THERAPY DIAG:  ?Pain in left foot ? ?Muscle weakness (generalized) ? ?Difficulty in walking, not elsewhere classified ? ?SUBJECTIVE: Pt reports that iontophoresis and IASTM helped her pain last time, although her pain is currently elevated due  to being on her feet all day.  Pt reports feeling a little under the weather earlier this week, which led her to rest more and not do her HEP as regularly. ? ?PAIN:  ?Are you having pain? Yes ?NPRS scale: 5/10 ?Pain location: Lt medial arch, plantar heel ?Pain description: sharp and aching  ?Aggravating factors: Standing after prolonged sitting; standing; walking; ascending/descending stairs ?Relieving factors: Diclofenac, rest ? ? ? ? ?OBJECTIVE:  ?*Unless otherwise noted, objective information was collected previously*  ? ?DIAGNOSTIC FINDINGS:  12/06/2020 Xray, left foot ?Normal osseous mineralization. Joint spaces preserved. No fracture/dislocation/boney destruction.  Minimal calcaneal spur present with mild thickening of plantar fascia. No other soft tissue abnormalities or radiopaque foreign bodies.  ?  ?PATIENT SURVEYS:  ?FOTO 49%, projected 64% in 13 visits ?08/01/2021: 67% ?  ?COGNITION: ?         Overall cognitive status: Within functional limits for tasks assessed              ?          ?SENSATION: ?         Light touch: Appears intact ?          ?  ?MUSCLE LENGTH: ?Gastrocnemius: Severe limitations BIL ?Soleus: Severe limitations BIL ?  ?OBSERVATION:  ?Pes cavus BIL ?  ?PALPATION: ?TTP to Lt plantar calcaneus, medial arch ?  ?LE AROM/PROM: ?  ?A/PROM Right ?05/21/2021 Left ?05/21/2021 Right ?06/28/2021 Left ?06/28/2021 Right ?07/27/2021 Left ?07/27/2021  ?Ankle dorsiflexion 0,5 -9,-2p! 3, 8 -1, 8 4 AROM 2 AROM  ?Ankle plantarflexion 42,45 46,46      ?Ankle inversion 16,34 21,40      ?Ankle eversion 16,22 10,25      ? (Blank rows = not tested) ?  ?LE MMT: ?  ?MMT Right ?05/21/2021 Left ?05/21/2021  ?Ankle dorsiflexion 5/5 5/5  ?Ankle plantarflexion 5/5 5/5  ?Ankle inversion 5/5 5/5  ?Ankle eversion 5/5 5/5  ? (Blank rows = not tested) ?  ?LOWER EXTREMITY SPECIAL TESTS:  ?  ?Windlass Test: (-) on Lt ?  ?FUNCTIONAL TESTS:  ?Squat: WNL ?SLS x15 seconds: WNL on Rt, 1 step error on Lt ?Double leg heel raise x20: WNL ?Single leg heel raise x10: performed with increased ankle eversion BIL, pain on Lt ?  ?GAIT: ?X20 feet, increase BIL calcaneal eversion; pt walks primarily on lateral aspect of feet ?  ?  ?  ?TODAY'S TREATMENT: ? ?[facility] Adult PT Treatment:                                                DATE: 08/01/2021 ?Therapeutic Exercise: ?Kickstand stance on Lt on Airex pad with lateral tilting of Tidal Tank 3x20 ?Forward lunge with Lt foot on each edge of Airex pad with forward/backward Tidal tank perturbations 2x10 on each side of Airex pad ?Standing great toe  extension (mid-foot stretch) with great toe on edge of Airex pad x2 minutes on Lt ?Standing slant board gastroc stretch x2 min BIL ?Manual Therapy: ?IASTM to Lt plantar fascia x10 minutes with pt education on side effects/ efficacy of treatment ?Neuromuscular re-ed: ?N/A ?Therapeutic Activity: ?N/A ?Modalities: ?Iontophoresis 6-hour patch with '4mg'$ /ml dexamethasone; pt instruction for use, including side effects and efficacy of treatment ?Self Care: ?N/A ? ? ?[facility] Adult PT Treatment:  DATE: 07/27/2021 ?Therapeutic Exercise: ?Bouncing heel raises on Cybex leg press machine 3x20 ?Standing gastroc slant board stretch x2 minutes BIL ?SLS with heel on Airex pad and handing off 5# kettlebell side to side 3x10 hand-offs ? ?Manual Therapy: ?IASTM to Lt plantar fascia x10 minutes with pt education on side effects/ efficacy of treatment ?Neuromuscular re-ed: ?N/A ?Therapeutic Activity: ?N/A ?Modalities: ?Iontophoresis 6-hour patch with '4mg'$ /ml dexamethasone; pt instruction for use, including side effects and efficacy of treatment ?Self Care: ?N/A ? ? ?[facility] Adult PT Treatment:                                                DATE: 06/28/2021 ?Therapeutic Exercise: ?Seated plantar fascia stretch x2 minutes on Lt ?Standing BIL gastroc slant board stretch x2 min ?Standing BIL soleus slant coard stretch x2 min ?DL heel raise on edge of Airex pad in // bars with 3-sec lowering 3x15 ?Woodpeckers/ arch blasters at wall 3x8 ?Standing great toe extension stretch on edge of Airex pad 2x30 sec ?Manual Therapy: ?N/A ?Neuromuscular re-ed: ?N/A ?Therapeutic Activity: ?N/A ?Modalities: ?Game Ready vasopneumatic treatment at 34 degrees F, x10 minutes to Left foot/ankle ?Self Care: ?N/A ? ?  ?  ?  ?PATIENT EDUCATION:  ?Education details: Educated on importance of daily adherence to HEP ?Person educated: Patient ?Education method: Explanation, Demonstration, and Handouts ?Education comprehension:  verbalized understanding and returned demonstration ?  ?  ?HOME EXERCISE PROGRAM: ?Access Code: [email or web address] ?  ?Exercises ?Standing heel raise WITH TENNIS BALL - 1 x daily - 7 x weekly - 3 sets - 10 reps ?Seated Plantar

## 2021-08-01 ENCOUNTER — Ambulatory Visit: Payer: BC Managed Care – PPO

## 2021-08-01 ENCOUNTER — Other Ambulatory Visit: Payer: Self-pay

## 2021-08-01 ENCOUNTER — Telehealth: Payer: Self-pay | Admitting: Licensed Clinical Social Worker

## 2021-08-01 DIAGNOSIS — M79672 Pain in left foot: Secondary | ICD-10-CM | POA: Diagnosis not present

## 2021-08-01 DIAGNOSIS — R262 Difficulty in walking, not elsewhere classified: Secondary | ICD-10-CM

## 2021-08-01 DIAGNOSIS — M6281 Muscle weakness (generalized): Secondary | ICD-10-CM

## 2021-08-08 ENCOUNTER — Ambulatory Visit: Payer: Self-pay | Admitting: Licensed Clinical Social Worker

## 2021-08-08 ENCOUNTER — Encounter: Payer: Self-pay | Admitting: Licensed Clinical Social Worker

## 2021-08-08 DIAGNOSIS — Z1379 Encounter for other screening for genetic and chromosomal anomalies: Secondary | ICD-10-CM | POA: Insufficient documentation

## 2021-08-08 NOTE — Progress Notes (Signed)
HPI:  [name] was previously seen in the [facility] clinic due to a family history of cancer and concerns regarding a hereditary predisposition to cancer. Please refer to our prior cancer genetics clinic note for more information regarding our discussion, assessment and recommendations, at the time. [name] recent genetic test results were disclosed to her, as were recommendations warranted by these results. These results and recommendations are discussed in more detail below. ? ?CANCER HISTORY:  ?Oncology History  ? No history exists.  ? ? ?FAMILY HISTORY:  ?We obtained a detailed, 4-generation family history.  Significant diagnoses are listed below: ?Family History  ?Problem Relation Age of Onset  ? Hypertension Mother   ? Hepatitis Mother   ?     Hepatitis C  ? Asthma Sister   ? Breast cancer Sister 102  ? Diabetes Maternal Grandmother   ?     leg amputation  ? Heart disease Maternal Grandmother   ? Hypertension Maternal Grandmother   ? Stroke Maternal Aunt 50  ?     2  ? Cirrhosis Maternal Aunt   ? Cancer Paternal Aunt   ?     lung cancer  ? Breast cancer Cousin   ?     first cousins once removed x2  ? Breast cancer Other   ?     mat great aunts x3  ? Colon cancer Neg Hx   ? Rectal cancer Neg Hx   ? Stomach cancer Neg Hx   ? Colon polyps Neg Hx   ? ? ?[name] has 2 daughters, no cancers. She has 1 maternal half brother, 1 maternal half sister. Her sister was diagnosed with breast cancer last year at 73. ?  ?[name]'s mother is living at 51 and has no history of cancer. Patient had 3 maternal aunts and 2 maternal uncles. One aunt had cervical cancer. No known cancers in first cousins. Maternal grandmother passed in her 85s due to kidney issues. This grandmother had 3 sisters who all passed from breast cancer, all diagnosed older than age 74. 2 of these sisters also had daughters who had breast cancer, and one sister had a son who had cancer but  patient is unsure the type. Patient's maternal grandfather is deceased, no info.  ?  ?[name]'s father was killed when patient was 82 months old, she has limited information about this side of the family. Her paternal aunt had lung cancer. No other known cancers on this side of the family. ?  ?[name] is unaware of previous family history of genetic testing for hereditary cancer risks. Patient's maternal ancestors are of African American descent, and paternal ancestors are of African American descent. There is no reported Ashkenazi Jewish ancestry. There is no known consanguinity. ?  ?  ? ?GENETIC TEST RESULTS: Genetic testing reported out on 07/31/2021 through the [employer] CancerNext-Expanded+RNA cancer panel found no pathogenic mutations.  ? ?The CancerNext-Expanded + RNAinsight gene panel offered by [employer] and includes sequencing and rearrangement analysis for the following 77 genes: IP, ALK, APC*, ATM*, AXIN2, BAP1, BARD1, BLM, BMPR1A, BRCA1*, BRCA2*, BRIP1*, CDC73, CDH1*,CDK4, CDKN1B, CDKN2A, CHEK2*, CTNNA1, DICER1, FANCC, FH, FLCN, GALNT12, KIF1B, LZTR1, MAX, MEN1, MET, MLH1*, MSH2*, MSH3, MSH6*, MUTYH*, NBN, NF1*, NF2, NTHL1, PALB2*, PHOX2B, PMS2*, POT1, PRKAR1A, PTCH1, PTEN*, RAD51C*, RAD51D*,RB1, RECQL, RET, SDHA, SDHAF2, SDHB, SDHC, SDHD, SMAD4, SMARCA4, SMARCB1, SMARCE1, STK11, SUFU, TMEM127, TP53*,TSC1, TSC2, VHL and XRCC2 (sequencing and deletion/duplication); EGFR, EGLN1, HOXB13, KIT, MITF, PDGFRA, POLD1 and  POLE (sequencing only); EPCAM and GREM1 (deletion/duplication only). ? ?The test report has been scanned into EPIC and is located under the Molecular Pathology section of the Results Review tab.  A portion of the result report is included below for reference.  ? ? ? ?We discussed that because current genetic testing is not perfect, it is possible there may be a gene mutation in one of these genes that current testing cannot detect, but that chance is small.  There could  be another gene that has not yet been discovered, or that we have not yet tested, that is responsible for the cancer diagnoses in the family. It is also possible there is a hereditary cause for the cancer in the family that [name] did not inherit and therefore was not identified in her testing.  Therefore, it is important to remain in touch with cancer genetics in the future so that we can continue to offer [name] the most up to date genetic testing.  ? ?ADDITIONAL GENETIC TESTING: We discussed with [name] that her genetic testing was fairly extensive.  If there are genes identified to increase cancer risk that can be analyzed in the future, we would be happy to discuss and coordinate this testing at that time.   ? ?CANCER SCREENING RECOMMENDATIONS: [name] test result is considered negative (normal).  This means that we have not identified a hereditary cause for her family history of cancer at this time.  ? ?While reassuring, this does not definitively rule out a hereditary predisposition to cancer. It is still possible that there could be genetic mutations that are undetectable by current technology. There could be genetic mutations in genes that have not been tested or identified to increase cancer risk.  Therefore, it is recommended she continue to follow the cancer management and screening guidelines provided by her primary healthcare provider.  ? ?An individual's cancer risk and medical management are not determined by genetic test results alone. Overall cancer risk assessment incorporates additional factors, including personal medical history, family history, and any available genetic information that may result in a personalized plan for cancer prevention and surveillance. ? ?Based on [name]'s personal and family history of cancer as well as her genetic test results, risk model [name] was used to estimate her risk of developing breast  cancer. This estimates her lifetime risk of developing breast cancer to be approximately 27%.  The patient's lifetime breast cancer risk is a preliminary estimate based on available information using one of several models endorsed by the [employer]  (NCCN). The NCCN recommends consideration of breast MRI screening as an adjunct to mammography for patients at high risk (defined as 20% or greater lifetime risk).  This risk estimate can change over time and may be repeated to reflect new information in her personal or family history in the future. ? ? Ms. Richwine has been determined to be at high risk for breast cancer. her Tyrer-Cuzick risk score is 27%.  For women with a greater than 20% lifetime risk of breast cancer, the [employer]  (NCCN) recommends the following:  ? 1.      Clinical encounter every 6-12 months to begin when identified as being at increased risk, but not before age 86  ?2.      Annual mammograms. Tomosynthesis is recommended starting 10 years earlier than the youngest breast cancer diagnosis in the family or at age 44 (whichever comes first), but not before age 78   ?  3.      Annual breast MRI starting 10 years earlier than the youngest breast cancer diagnosis in the family or at age 34 (whichever comes first), but not before age 43  ? ? ?RECOMMENDATIONS FOR FAMILY MEMBERS:  Relatives in this family might be at some increased risk of developing cancer, over the general population risk, simply due to the family history of cancer.  We recommended female relatives in this family have a yearly mammogram beginning at age 73, or 36 years younger than the earliest onset of cancer, an annual clinical breast exam, and perform monthly breast self-exams. Female relatives in this family should also have a gynecological exam as recommended by their primary provider.  All family members should be referred for colonoscopy starting at age 56.  ? ? It is  also possible there is a hereditary cause for the cancer in [name]'s family that she did not inherit and therefore was not identified in her.  Based on [name]'s family history, we

## 2021-08-08 NOTE — Telephone Encounter (Signed)
Revealed negative genetic testing.  This normal result is reassuring.  It is unlikely that there is an increased risk of cancer due to a mutation in one of these genes.  However, genetic testing is not perfect, and cannot definitively rule out a hereditary cause.  It will be important for her to keep in contact with genetics to learn if any additional testing may be needed in the future.      

## 2021-08-19 ENCOUNTER — Encounter: Payer: Self-pay | Admitting: Family Medicine

## 2021-08-19 ENCOUNTER — Telehealth (INDEPENDENT_AMBULATORY_CARE_PROVIDER_SITE_OTHER): Payer: BC Managed Care – PPO | Admitting: Family Medicine

## 2021-08-19 VITALS — Ht 66.0 in | Wt 205.0 lb

## 2021-08-19 DIAGNOSIS — R051 Acute cough: Secondary | ICD-10-CM

## 2021-08-19 DIAGNOSIS — J302 Other seasonal allergic rhinitis: Secondary | ICD-10-CM | POA: Diagnosis not present

## 2021-08-19 NOTE — Progress Notes (Signed)
Start time: 2:42 ?End time: 3:01 ? ?Virtual Visit via Video Note ? ?I connected with [name] on 08/19/21 by a video enabled telemedicine application and verified that I am speaking with the correct person using two identifiers. ? ?Location: ?Patient: in car, not driving. ?Provider: office ?  ?I discussed the limitations of evaluation and management by telemedicine and the availability of in person appointments. The patient expressed understanding and agreed to proceed. ? ?History of Present Illness: ? ?Chief Complaint  ?Patient presents with  ? Cough  ?  VIRTUAL cough and HA. Started April 1st. Home covid test 08/14/21 negative.   ? ?4/1 she started with a frontal headache, described as pressure. She doesn't usually get headaches. She also has drainage in her throat and cough. ?Denies nasal drainage.  Nose feels stuffy at night. ?Feels like something is in her throat that tickles.  Feels like there is dust or pollen in her throat. ?Cough is worse at night--leaks urine and makes her head hurt. ?Denies shortness of breath/wheezing. ?Cough is nonproductive. ? ?She has no h/o allergies. ?She started taking zyrtec last week, taking sporadically.  Last dose was yesterday. ?It helps some. ? ?No fever or chills. 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 ?She is outside a lot. ?+watery eyes when she lays down at night. ? ?No h/o HTN, no way to take BP ? ?BP Readings from Last 3 Encounters:  ?05/29/21 124/84  ?03/14/21 110/74  ?02/15/21 118/78  ? ? ?PMH, PSH, SH reviewed ?  ?Outpatient Encounter Medications as of 08/19/2021  ?Medication Sig Note  ? acetaminophen (TYLENOL) 325 MG tablet Take 650 mg by mouth every 6 (six) hours as needed. 08/19/2021: Last dose 11pm  ? Multiple Vitamin (MULTIVITAMIN) capsule Take 1 capsule by mouth daily.   ? simvastatin (ZOCOR) 40 MG tablet Take 1 tablet daily   ? cetirizine (ZYRTEC) 10 MG tablet Take 10 mg by mouth daily. (Patient not taking: Reported on 08/19/2021)   ? diclofenac  (VOLTAREN) 75 MG EC tablet Take 1 tablet (75 mg total) by mouth 2 (two) times daily. (Patient not taking: Reported on 05/29/2021) 05/29/2021: As needed  ? methocarbamol (ROBAXIN) 500 MG tablet methocarbamol 500 mg tablet ? TAKE 1-2 TABLETS (500-1,000 MG TOTAL) BY MOUTH EVERY 8 (EIGHT) HOURS AS NEEDED FOR MUSCLE SPASMS. (Patient not taking: Reported on 08/19/2021) 08/19/2021: As needed  ? ondansetron (ZOFRAN) 4 MG tablet ondansetron HCl 4 mg tablet ? TAKE 1 TABLET BY MOUTH EVERY 6 HOURS. (Patient not taking: Reported on 08/19/2021) 08/19/2021: As needed  ? [DISCONTINUED] acetaminophen (TYLENOL) 500 MG tablet Take 1,000 mg by mouth as needed for mild pain, moderate pain, fever or headache. (Patient not taking: Reported on 05/29/2021)   ? [DISCONTINUED] simvastatin (ZOCOR) 40 MG tablet Take 1 tablet by mouth daily.   ? ?No facility-administered encounter medications on file as of 08/19/2021.  ? ?Allergies  ?Allergen Reactions  ? Aspirin Nausea Only and Other (See Comments)  ?  Stomach cramping  ? Ibuprofen   ?  Pt stated, "Upsets stomach while I am on my cholesterol medicine"  ? ? ?ROS: no fever, n/v/d, rashes. ?+headache, congestion, PND and cough, per HPI.  Watery eyes. ?No chest pain, palpitations, shortness of breath. ? ? ? ?Observations/Objective: ? ?Ht '5\' 6"'$  (1.676 m)   Wt 205 lb (93 kg)   LMP 10/08/2011   BMI 33.09 kg/m?  ? ?Pleasant, well-appearing female in no distress ?She is alert and oriented.  ?Cranial nerve are grossly intact. ?No sniffling,  throat-clearing or coughing during the visit. ?She is speaking comfortably, in no distress ?Exam is limited due to virtual nature of the visit. ?At one point, was walking from car to building, speaking comfortably. ? ? ?Assessment and Plan: ? ?Seasonal allergic rhinitis, unspecified trigger - with sinus headache. Some improvement with zyrtec. To add sudafed prn, and start Flonase. s/sx infection reviewed, contact us if worsens ? ?Acute cough - recommended mucinex  DM ? ?Stay well hydrated. ?I recommend guaifenesin (expectorant, found in Mucinex or Robitussin); you may prefer to take the DM version, which has dextromethorphan (cough suppressant). ?Restart Zyrtec and take it daily. ?Start Flonase nasal spray (over-the-counter). Use 2 gentle sniffs into each nostril, once daily. ?You can also try sudafed as needed for sinus headache. ?You may continue tylenol as needed for headache as well. ? ?Contact us if you develop fever, discolored mucus, worsening cough or headache,  or shortness of breath. ? ? ?Follow Up Instructions: ? ?  ?I discussed the assessment and treatment plan with the patient. The patient was provided an opportunity to ask questions and all were answered. The patient agreed with the plan and demonstrated an understanding of the instructions. ?  ?The patient was advised to call back or seek an in-person evaluation if the symptoms worsen or if the condition fails to improve as anticipated. ? ?I spent 22 minutes dedicated to the care of this patient, including pre-visit review of records, face to face time, post-visit ordering of testing and documentation. ? ? ? ?Vikki Ports, MD ?

## 2021-08-19 NOTE — Patient Instructions (Addendum)
?  Stay well hydrated. ?I recommend guaifenesin (expectorant, found in Mucinex or Robitussin); you may prefer to take the DM version, which has dextromethorphan (cough suppressant). ?Restart Zyrtec and take it daily. ?Start Flonase nasal spray (over-the-counter). Use 2 gentle sniffs into each nostril, once daily. ?You can also try sudafed as needed for sinus headache. ?You may continue tylenol as needed for headache as well. ? ?Contact us if you develop fever, discolored mucus, worsening cough or headache,  or shortness of breath. ?

## 2021-08-23 ENCOUNTER — Ambulatory Visit (INDEPENDENT_AMBULATORY_CARE_PROVIDER_SITE_OTHER): Payer: BC Managed Care – PPO | Admitting: Physician Assistant

## 2021-08-23 ENCOUNTER — Encounter: Payer: Self-pay | Admitting: Physician Assistant

## 2021-08-23 VITALS — BP 110/70 | HR 84 | Temp 98.0°F | Ht 66.0 in | Wt 207.0 lb

## 2021-08-23 DIAGNOSIS — J302 Other seasonal allergic rhinitis: Secondary | ICD-10-CM | POA: Diagnosis not present

## 2021-08-23 DIAGNOSIS — J9801 Acute bronchospasm: Secondary | ICD-10-CM

## 2021-08-23 DIAGNOSIS — J40 Bronchitis, not specified as acute or chronic: Secondary | ICD-10-CM

## 2021-08-23 DIAGNOSIS — R051 Acute cough: Secondary | ICD-10-CM | POA: Diagnosis not present

## 2021-08-23 DIAGNOSIS — R058 Other specified cough: Secondary | ICD-10-CM

## 2021-08-23 MED ORDER — BENZONATATE 200 MG PO CAPS
200.0000 mg | ORAL_CAPSULE | Freq: Two times a day (BID) | ORAL | 0 refills | Status: DC | PRN
Start: 2021-08-23 — End: 2021-09-23

## 2021-08-23 MED ORDER — ALBUTEROL SULFATE HFA 108 (90 BASE) MCG/ACT IN AERS: 2.0000 | INHALATION_SPRAY | Freq: Four times a day (QID) | RESPIRATORY_TRACT | 0 refills | Status: DC | PRN

## 2021-08-23 MED ORDER — METHYLPREDNISOLONE 4 MG PO TBPK: ORAL_TABLET | ORAL | 0 refills | Status: DC

## 2021-08-23 MED ORDER — DOXYCYCLINE HYCLATE 100 MG PO TABS: 100.0000 mg | ORAL_TABLET | Freq: Two times a day (BID) | ORAL | 0 refills | Status: AC

## 2021-08-23 NOTE — Patient Instructions (Signed)
You can walk in to get the chest x-ray: ?Diagnostic Radiology and Imaging ? ?[facility] ?[address] ?[address] ?[place], [place] [address] ? ?Phone [phone] ?Fax [phone] ? ?Hours of Operation ?General hours of operation are Monday - Friday, 8 am-5 pm  ?

## 2021-08-23 NOTE — Progress Notes (Addendum)
? ?Acute Office Visit ? ?Subjective:  ? ? Patient ID: [name], female    DOB: 13-Dec-1965, 56 y.o.   MRN: [id number] ? ?Chief Complaint  ?Patient presents with  ? Acute Visit  ?  Coughing and wheezing, headaches. She has had these symptoms for about 2 weeks.  ? ? ?HPI ?Patient is in today for follow up, states she is coughing a lot and heard wheezing when lying down last night; cough is productive; has a frontal headache that is now better; states she is using nasal steroids, mucinex dm, and taking Zyrtec but isn't getting any better; denies fever / chills / nausea / vomiting / diarrhea / constipation; denies sore throat; feels like chest is burning when she coughs; denies recent travel; denies sick contacts; denies hx/o pneumoniaor asthma; quit smoking years ago. ? ? ?Outpatient Medications Prior to Visit  ?Medication Sig Dispense Refill  ? acetaminophen (TYLENOL) 325 MG tablet Take 650 mg by mouth every 6 (six) hours as needed.    ? cetirizine (ZYRTEC) 10 MG tablet Take 10 mg by mouth daily.    ? Multiple Vitamin (MULTIVITAMIN) capsule Take 1 capsule by mouth daily.    ? simvastatin (ZOCOR) 40 MG tablet Take 1 tablet daily 90 tablet 1  ? diclofenac (VOLTAREN) 75 MG EC tablet Take 1 tablet (75 mg total) by mouth 2 (two) times daily. (Patient not taking: Reported on 05/29/2021) 30 tablet 0  ? methocarbamol (ROBAXIN) 500 MG tablet methocarbamol 500 mg tablet ? TAKE 1-2 TABLETS (500-1,000 MG TOTAL) BY MOUTH EVERY 8 (EIGHT) HOURS AS NEEDED FOR MUSCLE SPASMS. (Patient not taking: Reported on 08/19/2021)    ? ondansetron (ZOFRAN) 4 MG tablet ondansetron HCl 4 mg tablet ? TAKE 1 TABLET BY MOUTH EVERY 6 HOURS. (Patient not taking: Reported on 08/19/2021)    ? ?No facility-administered medications prior to visit.  ? ? ?Allergies  ?Allergen Reactions  ? Aspirin Nausea Only and Other (See Comments)  ?  Stomach cramping  ? Ibuprofen   ?  Pt stated, "Upsets stomach while I am on my cholesterol medicine"  ? ? ?Review  of Systems  ?Constitutional:  Negative for activity change and chills.  ?HENT:  Positive for postnasal drip, rhinorrhea and sinus pressure. Negative for congestion, sinus pain and voice change.   ?Eyes:  Negative for pain and redness.  ?Respiratory:  Positive for cough and wheezing. Negative for shortness of breath.   ?Cardiovascular:  Negative for chest pain.  ?Gastrointestinal:  Negative for constipation, diarrhea, nausea and vomiting.  ?Endocrine: Negative for polyuria.  ?Genitourinary:  Negative for frequency.  ?Skin:  Negative for color change and rash.  ?Allergic/Immunologic: Negative for immunocompromised state.  ?Neurological:  Positive for headaches. Negative for dizziness.  ?Psychiatric/Behavioral:  Negative for agitation.   ? ?   ?Objective:  ?  ?Physical Exam ?Vitals and nursing note reviewed.  ?Constitutional:   ?   General: She is not in acute distress. ?   Appearance: Normal appearance. She is not ill-appearing.  ?HENT:  ?   Head: Normocephalic and atraumatic.  ?   Right Ear: Tympanic membrane, ear canal and external ear normal.  ?   Left Ear: Tympanic membrane, ear canal and external ear normal.  ?   Nose: Congestion present.  ?Eyes:  ?   Extraocular Movements: Extraocular movements intact.  ?   Conjunctiva/sclera: Conjunctivae normal.  ?   Pupils: Pupils are equal, round, and reactive to light.  ?Cardiovascular:  ?   Rate and Rhythm:  Normal rate and regular rhythm.  ?   Pulses: Normal pulses.  ?   Heart sounds: Normal heart sounds.  ?Pulmonary:  ?   Effort: Pulmonary effort is normal.  ?   Breath sounds: Normal breath sounds. No wheezing.  ?Abdominal:  ?   General: Bowel sounds are normal.  ?   Palpations: Abdomen is soft.  ?Musculoskeletal:     ?   General: Normal range of motion.  ?   Cervical back: Normal range of motion and neck supple.  ?   Right lower leg: No edema.  ?   Left lower leg: No edema.  ?Skin: ?   General: Skin is warm and dry.  ?   Findings: No bruising.  ?Neurological:  ?    General: No focal deficit present.  ?   Mental Status: She is alert and oriented to person, place, and time.  ?Psychiatric:     ?   Mood and Affect: Mood normal.     ?   Behavior: Behavior normal.     ?   Thought Content: Thought content normal.  ? ? ?BP 110/70   Pulse 84   Temp 98 ?F (36.7 ?C)   Wt 207 lb (93.9 kg)   LMP 10/08/2011   SpO2 93%   BMI 33.41 kg/m?  ? ?Wt Readings from Last 3 Encounters:  ?08/23/21 207 lb (93.9 kg)  ?08/19/21 205 lb (93 kg)  ?05/29/21 209 lb 12.8 oz (95.2 kg)  ? ? ?Results for orders placed or performed in visit on 02/18/21  ?Lipid panel  ?Result Value Ref Range  ? Cholesterol, Total 173 100 - 199 mg/dL  ? Triglycerides 76 0 - 149 mg/dL  ? HDL 58 >39 mg/dL  ? VLDL Cholesterol Cal 14 5 - 40 mg/dL  ? LDL Chol Calc (NIH) 101 (H) 0 - 99 mg/dL  ? Chol/HDL Ratio 3.0 0.0 - 4.4 ratio  ? ? ?   ?Assessment & Plan:  ?1. Acute cough ?- DG Chest 2 View; Future ? ?2. Bronchitis ?- DG Chest 2 View; Future ? ?3. Bronchospasm ?- DG Chest 2 View; Future ? ?4. Seasonal allergic rhinitis, unspecified trigger ? ?Continue Zyrtec, nasal steroids ?Doxycycline 100 mg and Medol dosepak for bronchitis, tessalon perles 200 mg for cough, albuterol  mdi for wheezing ?Chest x-ray ordered ? ?If symptoms get worse, please call the office for a follow up appointment if available, otherwise go to Urgent Care, call 911 / EMS, or go to the Emergency Department for further evaluation. ? ? ? ?Meds ordered this encounter  ?Medications  ? benzonatate (TESSALON) 200 MG capsule  ?  Sig: Take 1 capsule (200 mg total) by mouth 2 (two) times daily as needed for cough.  ?  Dispense:  20 capsule  ?  Refill:  0  ?  Order Specific Question:   Supervising Provider  ?  Answer:   Denita Lung [[id number]]  ? doxycycline (VIBRA-TABS) 100 MG tablet  ?  Sig: Take 1 tablet (100 mg total) by mouth 2 (two) times daily for 10 days.  ?  Dispense:  20 tablet  ?  Refill:  0  ?  Order Specific Question:   Supervising Provider  ?  Answer:    Denita Lung [[id number]]  ? methylPREDNISolone (MEDROL DOSEPAK) 4 MG TBPK tablet  ?  Sig: Use as directed  ?  Dispense:  21 tablet  ?  Refill:  0  ?  Order Specific Question:   Supervising  Provider  ?  Answer:   Denita Lung [[id number]]  ? albuterol (VENTOLIN HFA) 108 (90 Base) MCG/ACT inhaler  ?  Sig: Inhale 2 puffs into the lungs every 6 (six) hours as needed for wheezing or shortness of breath.  ?  Dispense:  8 g  ?  Refill:  0  ?  Order Specific Question:   Supervising Provider  ?  Answer:   Denita Lung [[id number]]  ? ? ?Return in about 3 months (around 11/22/2021) for Return for Annual Exam with PCP Jimmye Norman. ? ?Irene Pap, PA-C ?

## 2021-08-29 DIAGNOSIS — N898 Other specified noninflammatory disorders of vagina: Secondary | ICD-10-CM | POA: Diagnosis not present

## 2021-08-29 DIAGNOSIS — Z01419 Encounter for gynecological examination (general) (routine) without abnormal findings: Secondary | ICD-10-CM | POA: Diagnosis not present

## 2021-08-29 DIAGNOSIS — Z6834 Body mass index (BMI) 34.0-34.9, adult: Secondary | ICD-10-CM | POA: Diagnosis not present

## 2021-09-05 ENCOUNTER — Other Ambulatory Visit: Payer: Self-pay | Admitting: Surgery

## 2021-09-05 ENCOUNTER — Other Ambulatory Visit: Payer: Self-pay | Admitting: Medical

## 2021-09-05 DIAGNOSIS — N6092 Unspecified benign mammary dysplasia of left breast: Secondary | ICD-10-CM

## 2021-09-19 ENCOUNTER — Other Ambulatory Visit: Payer: Self-pay | Admitting: Physician Assistant

## 2021-09-19 DIAGNOSIS — R062 Wheezing: Secondary | ICD-10-CM

## 2021-09-19 MED ORDER — ALBUTEROL SULFATE HFA 108 (90 BASE) MCG/ACT IN AERS: 2.0000 | INHALATION_SPRAY | Freq: Four times a day (QID) | RESPIRATORY_TRACT | 0 refills | Status: DC | PRN

## 2021-09-19 NOTE — Telephone Encounter (Signed)
Pt is requesting a refill on Albuterol inhaler  ?

## 2021-09-19 NOTE — Telephone Encounter (Signed)
Last refill. If symptoms persist will need evaluation by Pulmonologist for COPD  ?

## 2021-09-23 ENCOUNTER — Encounter (HOSPITAL_BASED_OUTPATIENT_CLINIC_OR_DEPARTMENT_OTHER): Payer: Self-pay | Admitting: Surgery

## 2021-09-23 ENCOUNTER — Other Ambulatory Visit: Payer: Self-pay

## 2021-10-01 ENCOUNTER — Ambulatory Visit
Admission: RE | Admit: 2021-10-01 | Discharge: 2021-10-01 | Disposition: A | Discharge status: Home / Self Care | Payer: BC Managed Care – PPO | Source: Ambulatory Visit | Attending: Surgery | Admitting: Surgery

## 2021-10-01 DIAGNOSIS — R928 Other abnormal and inconclusive findings on diagnostic imaging of breast: Secondary | ICD-10-CM | POA: Diagnosis not present

## 2021-10-01 DIAGNOSIS — N6092 Unspecified benign mammary dysplasia of left breast: Secondary | ICD-10-CM

## 2021-10-01 NOTE — Progress Notes (Signed)

## 2021-10-02 ENCOUNTER — Ambulatory Visit (HOSPITAL_BASED_OUTPATIENT_CLINIC_OR_DEPARTMENT_OTHER): Payer: BC Managed Care – PPO | Admitting: Anesthesiology

## 2021-10-02 ENCOUNTER — Encounter (HOSPITAL_BASED_OUTPATIENT_CLINIC_OR_DEPARTMENT_OTHER)
Admission: RE | Disposition: A | Discharge status: Home / Self Care | Payer: Self-pay | Source: Home / Self Care | Attending: Surgery

## 2021-10-02 ENCOUNTER — Other Ambulatory Visit: Payer: Self-pay

## 2021-10-02 ENCOUNTER — Ambulatory Visit
Admission: RE | Admit: 2021-10-02 | Discharge: 2021-10-02 | Disposition: A | Discharge status: Home / Self Care | Payer: BC Managed Care – PPO | Source: Ambulatory Visit | Attending: Surgery | Admitting: Surgery

## 2021-10-02 ENCOUNTER — Ambulatory Visit (HOSPITAL_BASED_OUTPATIENT_CLINIC_OR_DEPARTMENT_OTHER)
Admission: RE | Admit: 2021-10-02 | Discharge: 2021-10-02 | Disposition: A | Discharge status: Home / Self Care | Payer: BC Managed Care – PPO | Attending: Surgery | Admitting: Surgery

## 2021-10-02 ENCOUNTER — Encounter (HOSPITAL_BASED_OUTPATIENT_CLINIC_OR_DEPARTMENT_OTHER): Payer: Self-pay | Admitting: Surgery

## 2021-10-02 DIAGNOSIS — Z87891 Personal history of nicotine dependence: Secondary | ICD-10-CM | POA: Diagnosis not present

## 2021-10-02 DIAGNOSIS — N6092 Unspecified benign mammary dysplasia of left breast: Secondary | ICD-10-CM

## 2021-10-02 DIAGNOSIS — Z803 Family history of malignant neoplasm of breast: Secondary | ICD-10-CM | POA: Diagnosis not present

## 2021-10-02 DIAGNOSIS — N6489 Other specified disorders of breast: Secondary | ICD-10-CM | POA: Diagnosis not present

## 2021-10-02 DIAGNOSIS — R7303 Prediabetes: Secondary | ICD-10-CM

## 2021-10-02 DIAGNOSIS — E785 Hyperlipidemia, unspecified: Secondary | ICD-10-CM | POA: Insufficient documentation

## 2021-10-02 DIAGNOSIS — R928 Other abnormal and inconclusive findings on diagnostic imaging of breast: Secondary | ICD-10-CM | POA: Diagnosis not present

## 2021-10-02 HISTORY — PX: BREAST LUMPECTOMY WITH RADIOACTIVE SEED LOCALIZATION: SHX6424

## 2021-10-02 SURGERY — BREAST LUMPECTOMY WITH RADIOACTIVE SEED LOCALIZATION
Anesthesia: General | Site: Breast | Laterality: Left

## 2021-10-02 MED ORDER — CEFAZOLIN SODIUM-DEXTROSE 2-4 GM/100ML-% IV SOLN
INTRAVENOUS | Status: AC
Start: 2021-10-02 — End: ?
  Filled 2021-10-02: qty 100

## 2021-10-02 MED ORDER — FENTANYL CITRATE (PF) 100 MCG/2ML IJ SOLN
INTRAMUSCULAR | Status: AC
  Filled 2021-10-02: qty 2

## 2021-10-02 MED ORDER — PROPOFOL 10 MG/ML IV BOLUS
INTRAVENOUS | Status: AC
  Filled 2021-10-02: qty 20

## 2021-10-02 MED ORDER — PHENYLEPHRINE HCL (PRESSORS) 10 MG/ML IV SOLN
INTRAVENOUS | Status: DC | PRN
  Administered 2021-10-02: 80 ug via INTRAVENOUS
  Administered 2021-10-02 (×3): 160 ug via INTRAVENOUS

## 2021-10-02 MED ORDER — CHLORHEXIDINE GLUCONATE CLOTH 2 % EX PADS: 6.0000 | MEDICATED_PAD | Freq: Once | CUTANEOUS | Status: DC

## 2021-10-02 MED ORDER — OXYCODONE HCL 5 MG PO TABS: 5.0000 mg | ORAL_TABLET | Freq: Four times a day (QID) | ORAL | 0 refills | Status: DC | PRN

## 2021-10-02 MED ORDER — MIDAZOLAM HCL 5 MG/5ML IJ SOLN
INTRAMUSCULAR | Status: DC | PRN
  Administered 2021-10-02: 2 mg via INTRAVENOUS

## 2021-10-02 MED ORDER — DEXAMETHASONE SODIUM PHOSPHATE 10 MG/ML IJ SOLN
INTRAMUSCULAR | Status: DC | PRN
  Administered 2021-10-02: 5 mg via INTRAVENOUS

## 2021-10-02 MED ORDER — SODIUM CHLORIDE 0.9 % IV SOLN
INTRAVENOUS | Status: AC
  Filled 2021-10-02: qty 10

## 2021-10-02 MED ORDER — FENTANYL CITRATE (PF) 100 MCG/2ML IJ SOLN
INTRAMUSCULAR | Status: DC | PRN
  Administered 2021-10-02: 50 ug via INTRAVENOUS

## 2021-10-02 MED ORDER — BUPIVACAINE-EPINEPHRINE (PF) 0.25% -1:200000 IJ SOLN
INTRAMUSCULAR | Status: DC | PRN
  Administered 2021-10-02: 20 mL

## 2021-10-02 MED ORDER — LACTATED RINGERS IV SOLN: INTRAVENOUS | Status: DC

## 2021-10-02 MED ORDER — OXYCODONE HCL 5 MG PO TABS
5.0000 mg | ORAL_TABLET | Freq: Once | ORAL | Status: AC
  Administered 2021-10-02: 5 mg via ORAL

## 2021-10-02 MED ORDER — SODIUM CHLORIDE 0.9 % IV SOLN
INTRAVENOUS | Status: DC | PRN
  Administered 2021-10-02: 400 mL

## 2021-10-02 MED ORDER — ONDANSETRON HCL 4 MG/2ML IJ SOLN
INTRAMUSCULAR | Status: DC | PRN
  Administered 2021-10-02: 4 mg via INTRAVENOUS

## 2021-10-02 MED ORDER — DEXAMETHASONE SODIUM PHOSPHATE 10 MG/ML IJ SOLN
INTRAMUSCULAR | Status: AC
  Filled 2021-10-02: qty 1

## 2021-10-02 MED ORDER — FENTANYL CITRATE (PF) 100 MCG/2ML IJ SOLN
25.0000 ug | INTRAMUSCULAR | Status: DC | PRN
  Administered 2021-10-02 (×2): 50 ug via INTRAVENOUS

## 2021-10-02 MED ORDER — OXYCODONE HCL 5 MG PO TABS
ORAL_TABLET | ORAL | Status: AC
  Filled 2021-10-02: qty 1

## 2021-10-02 MED ORDER — ACETAMINOPHEN 500 MG PO TABS
ORAL_TABLET | ORAL | Status: AC
  Filled 2021-10-02: qty 2

## 2021-10-02 MED ORDER — GABAPENTIN 300 MG PO CAPS
ORAL_CAPSULE | ORAL | Status: AC
  Filled 2021-10-02: qty 1

## 2021-10-02 MED ORDER — GABAPENTIN 300 MG PO CAPS
300.0000 mg | ORAL_CAPSULE | ORAL | Status: AC
  Administered 2021-10-02: 300 mg via ORAL

## 2021-10-02 MED ORDER — LIDOCAINE 2% (20 MG/ML) 5 ML SYRINGE
INTRAMUSCULAR | Status: AC
  Filled 2021-10-02: qty 5

## 2021-10-02 MED ORDER — MIDAZOLAM HCL 2 MG/2ML IJ SOLN
INTRAMUSCULAR | Status: AC
  Filled 2021-10-02: qty 2

## 2021-10-02 MED ORDER — PROPOFOL 10 MG/ML IV BOLUS
INTRAVENOUS | Status: DC | PRN
  Administered 2021-10-02: 200 mg via INTRAVENOUS

## 2021-10-02 MED ORDER — CEFAZOLIN SODIUM-DEXTROSE 2-4 GM/100ML-% IV SOLN
2.0000 g | INTRAVENOUS | Status: AC
  Administered 2021-10-02: 2 g via INTRAVENOUS

## 2021-10-02 MED ORDER — ACETAMINOPHEN 500 MG PO TABS: 1000.0000 mg | ORAL_TABLET | ORAL | Status: DC

## 2021-10-02 MED ORDER — ACETAMINOPHEN 500 MG PO TABS
1000.0000 mg | ORAL_TABLET | Freq: Once | ORAL | Status: AC
  Administered 2021-10-02: 1000 mg via ORAL

## 2021-10-02 MED ORDER — LIDOCAINE HCL (CARDIAC) PF 100 MG/5ML IV SOSY
PREFILLED_SYRINGE | INTRAVENOUS | Status: DC | PRN
  Administered 2021-10-02: 80 mg via INTRATRACHEAL

## 2021-10-02 SURGICAL SUPPLY — 48 items
ADH SKN CLS APL DERMABOND .7 (GAUZE/BANDAGES/DRESSINGS) ×1
APL PRP STRL LF DISP 70% ISPRP (MISCELLANEOUS) ×1
APPLIER CLIP 9.375 MED OPEN (MISCELLANEOUS)
APR CLP MED 9.3 20 MLT OPN (MISCELLANEOUS)
BINDER BREAST XXLRG (GAUZE/BANDAGES/DRESSINGS) ×1 IMPLANT
BLADE SURG 15 STRL LF DISP TIS (BLADE) ×2 IMPLANT
BLADE SURG 15 STRL SS (BLADE) ×2
CANISTER SUC SOCK COL 7IN (MISCELLANEOUS) IMPLANT
CANISTER SUCT 1200ML W/VALVE (MISCELLANEOUS) ×1 IMPLANT
CHLORAPREP W/TINT 26 (MISCELLANEOUS) ×3 IMPLANT
CLIP APPLIE 9.375 MED OPEN (MISCELLANEOUS) IMPLANT
COVER BACK TABLE 60X90IN (DRAPES) ×3 IMPLANT
COVER MAYO STAND STRL (DRAPES) ×3 IMPLANT
COVER PROBE W GEL 5X96 (DRAPES) ×3 IMPLANT
DERMABOND ADVANCED (GAUZE/BANDAGES/DRESSINGS) ×1
DERMABOND ADVANCED .7 DNX12 (GAUZE/BANDAGES/DRESSINGS) ×2 IMPLANT
DRAPE LAPAROTOMY 100X72 PEDS (DRAPES) ×3 IMPLANT
DRAPE UTILITY XL STRL (DRAPES) ×3 IMPLANT
ELECT COATED BLADE 2.86 ST (ELECTRODE) ×3 IMPLANT
ELECT REM PT RETURN 9FT ADLT (ELECTROSURGICAL) ×2
ELECTRODE REM PT RTRN 9FT ADLT (ELECTROSURGICAL) ×2 IMPLANT
GLOVE BIO SURGEON STRL SZ 6.5 (GLOVE) ×1 IMPLANT
GLOVE BIOGEL PI IND STRL 6.5 (GLOVE) IMPLANT
GLOVE BIOGEL PI IND STRL 8 (GLOVE) ×2 IMPLANT
GLOVE BIOGEL PI INDICATOR 6.5 (GLOVE) ×1
GLOVE BIOGEL PI INDICATOR 8 (GLOVE) ×1
GLOVE ECLIPSE 8.0 STRL XLNG CF (GLOVE) ×3 IMPLANT
GOWN STRL REUS W/ TWL LRG LVL3 (GOWN DISPOSABLE) ×4 IMPLANT
GOWN STRL REUS W/ TWL XL LVL3 (GOWN DISPOSABLE) ×2 IMPLANT
GOWN STRL REUS W/TWL LRG LVL3 (GOWN DISPOSABLE) ×4
GOWN STRL REUS W/TWL XL LVL3 (GOWN DISPOSABLE) ×2
HEMOSTAT ARISTA ABSORB 3G PWDR (HEMOSTASIS) IMPLANT
HEMOSTAT SNOW SURGICEL 2X4 (HEMOSTASIS) IMPLANT
KIT MARKER MARGIN INK (KITS) ×3 IMPLANT
NDL HYPO 25X1 1.5 SAFETY (NEEDLE) ×2 IMPLANT
NEEDLE HYPO 25X1 1.5 SAFETY (NEEDLE) ×2 IMPLANT
NS IRRIG 1000ML POUR BTL (IV SOLUTION) ×3 IMPLANT
PACK BASIN DAY SURGERY FS (CUSTOM PROCEDURE TRAY) ×3 IMPLANT
PENCIL SMOKE EVACUATOR (MISCELLANEOUS) ×3 IMPLANT
SLEEVE SCD COMPRESS KNEE MED (STOCKING) ×3 IMPLANT
SPONGE T-LAP 4X18 ~~LOC~~+RFID (SPONGE) ×3 IMPLANT
SUT MNCRL AB 4-0 PS2 18 (SUTURE) ×3 IMPLANT
SUT VICRYL 3-0 CR8 SH (SUTURE) ×3 IMPLANT
SYR CONTROL 10ML LL (SYRINGE) ×3 IMPLANT
TOWEL GREEN STERILE FF (TOWEL DISPOSABLE) ×3 IMPLANT
TRAY FAXITRON CT DISP (TRAY / TRAY PROCEDURE) ×3 IMPLANT
TUBE CONNECTING 20X1/4 (TUBING) ×1 IMPLANT
YANKAUER SUCT BULB TIP NO VENT (SUCTIONS) ×1 IMPLANT

## 2021-10-02 NOTE — Transfer of Care (Signed)
Immediate Anesthesia Transfer of Care Note  Patient: [name]  Procedure(s) Performed: LEFT BREAST LUMPECTOMY WITH RADIOACTIVE SEED LOCALIZATION (Left: Breast)  Patient Location: PACU  Anesthesia Type:General  Level of Consciousness: drowsy and patient cooperative  Airway & Oxygen Therapy: Patient Spontanous Breathing and Patient connected to face mask oxygen  Post-op Assessment: Report given to RN and Post -op Vital signs reviewed and stable  Post vital signs: Reviewed and stable  Last Vitals:  Vitals Value Taken Time  BP    Temp    Pulse 83 10/02/21 1019  Resp 28 10/02/21 1019  SpO2 98 % 10/02/21 1019  Vitals shown include unvalidated device data.  Last Pain:  Vitals:   10/02/21 0831  TempSrc: Oral  PainSc: 0-No pain         Complications: No notable events documented.

## 2021-10-02 NOTE — H&P (Signed)
Chief Complaint: Breast Problem   History of Present Illness: [name] is a 56 y.o. female who is seen today as an office consultation for evaluation of Breast Problem .   Patient presents for evaluation of left breast mammographic abnormality. Of note she has a history of left breast lumpectomy in 2019 for atypical ductal hyperplasia. She was counseled about her condition at that time and genetics was recommended due to family history of breast cancer. She opted not to pursue that. On her most recent mammogram she had a cluster of left breast microcalcifications upper outer quadrant. Core biopsy showed atypical ductal hyperplasia. She has no complaints of breast pain, nipple discharge or changes in either breast.  Review of Systems: A complete review of systems was obtained from the patient. I have reviewed this information and discussed as appropriate with the patient. See HPI as well for other ROS.  Review of Systems  Constitutional: Negative.  Eyes: Negative.  Respiratory: Negative.  Cardiovascular: Negative.  Gastrointestinal: Negative.  Genitourinary: Negative.  Musculoskeletal: Negative.  Skin: Negative.  Neurological: Negative.  Endo/Heme/Allergies: Negative.  Psychiatric/Behavioral: Negative.    Medical History: History reviewed. No pertinent past medical history.  There is no problem list on file for this patient.  Past Surgical History:  Procedure Laterality Date   breast surgery N/A    Allergies  Allergen Reactions   Aspirin Nausea and Other (See Comments)  Stomach cramping Stomach cramping   Ibuprofen Other (See Comments)  Pt stated, "Upsets stomach while I am on my cholesterol medicine" Pt stated, "Upsets stomach while I am on my cholesterol medicine"   Current Outpatient Medications on File Prior to Visit  Medication Sig Dispense Refill   simvastatin (ZOCOR) 40 MG tablet Take 1 tablet by mouth once daily   No current facility-administered  medications on file prior to visit.   Family History  Problem Relation Age of Onset   High blood pressure (Hypertension) Mother   Hyperlipidemia (Elevated cholesterol) Mother   High blood pressure (Hypertension) Sister   Hyperlipidemia (Elevated cholesterol) Sister   Breast cancer Sister   High blood pressure (Hypertension) Brother   Hyperlipidemia (Elevated cholesterol) Brother    Social History   Tobacco Use  Smoking Status Former   Types: Cigarettes  Smokeless Tobacco Never    Social History   Socioeconomic History   Marital status: Unknown  Tobacco Use   Smoking status: Former  Types: Cigarettes   Smokeless tobacco: Never  Vaping Use   Vaping Use: Never used  Substance and Sexual Activity   Alcohol use: Yes   Drug use: Never   Objective:   Vitals:  07/15/21 0944  BP: 130/86  Pulse: 71  Temp: 36.8 C (98.3 F)  SpO2: 99%  Weight: 93.7 kg (206 lb 9.6 oz)  Height: 167.6 cm ('5\' 6"'$ )   Body mass index is 33.35 kg/m.  Physical Exam Constitutional:  Appearance: Normal appearance.  HENT:  Head: Normocephalic.  Eyes:  Pupils: Pupils are equal, round, and reactive to light.  Cardiovascular:  Rate and Rhythm: Normal rate.  Pulmonary:  Effort: Pulmonary effort is normal.  Breath sounds: No stridor.  Chest:  Breasts: Right: Normal. No inverted nipple, mass or nipple discharge.  Left: Normal. No inverted nipple, mass or nipple discharge.   Musculoskeletal:  General: Normal range of motion.  Cervical back: Normal range of motion.  Lymphadenopathy:  Upper Body:  Right upper body: No supraclavicular or axillary adenopathy.  Left upper body: No supraclavicular or axillary adenopathy.  Skin: General: Skin is warm and dry.  Neurological:  General: No focal deficit present.  Mental Status: She is alert and oriented to person, place, and time.  Psychiatric:  Mood and Affect: Mood normal.  Behavior: Behavior normal.     Labs, Imaging and Diagnostic  Testing: CLINICAL DATA: 2-3 millimeter mobile superficial palpable mass in the LEFT axilla. Patient has history stereotactic guided core biopsy of LEFT breast calcifications performed in December of 2020. Pathology showed atypical ductal hyperplasia with calcifications. Patient went on to have excision by Dr. Brantley Stage, showing focal atypical ductal hyperplasia without malignancy in January of 2021. Family history of breast cancer in her maternal cousin and her sister.   EXAM: DIGITAL DIAGNOSTIC BILATERAL MAMMOGRAM WITH TOMOSYNTHESIS AND CAD; [place] AXILLARY LEFT   TECHNIQUE: Bilateral digital diagnostic mammography and breast tomosynthesis was performed. The images were evaluated with computer-aided detection.; Targeted ultrasound examination of the left axilla was performed.   COMPARISON: Previous exam(s).   ACR Breast Density Category b: There are scattered areas of fibroglandular density.   FINDINGS: RIGHT breast is negative.   Within the UPPER-OUTER QUADRANT of the LEFT breast, there is a group of linear, pleomorphic calcifications spanning 7 millimeters. These are similar in appearance and in a similar location to the previously biopsied calcifications associated with atypical ductal hyperplasia.   Spot tangential view of the LEFT axilla shows normal appearing lymph nodes and fibrofatty tissue.   On physical exam, I palpate no discrete mass in the LOWER anterior aspect of the LEFT axilla, in the area of concern   Targeted ultrasound is performed, showing lymph nodes with normal morphology in the LOWER LEFT axilla. No mass identified.   IMPRESSION: 1. 7 millimeter group of indeterminate calcifications in the UPPER-OUTER QUADRANT of the LEFT breast warranting tissue diagnosis. 2. LEFT axilla is negative in the area of concern.   RECOMMENDATION: Stereotactic biopsy LEFT breast   I have discussed the findings and recommendations with the patient. If applicable, a  reminder letter will be sent to the patient regarding the next appointment.   BI-RADS CATEGORY 4: Suspicious.     Electronically Signed By: Nolon Nations M.D. On: 06/25/2021 16:50   Diagnosis Breast, left, needle core biopsy, left breast UOQ calcifications - ATYPICAL DUCTAL HYPERPLASIA WITH CALCIFICATIONS  Assessment and Plan:   Diagnoses and all orders for this visit:  Atypical ductal hyperplasia of left breast - Ambulatory Referral to Genetics  Breast cancer screening, high risk patient    Discussed left breast lumpectomy with seed localization due to atypical ductal hyperplasia.  The procedure has been discussed with the patient. Alternatives to surgery have been discussed with the patient. Risks of surgery include bleeding, Infection, Seroma formation, death, and the need for further surgery. The patient understands and wishes to proceed.  Discussed high risk state recommend genetic counseling again. Also recommended yearly MRIs and discuss possible use of tamoxifen low risk. She will think about the other things but we will make referral for genetics.  No follow-ups on file.  Kennieth Francois, MD   I spent a total of 35 minutes in both face-to-face and non-face-to-face activities, excluding procedures performed, for this visit on the date of this encounter.

## 2021-10-02 NOTE — Interval H&P Note (Signed)
History and Physical Interval Note:  10/02/2021 9:16 AM  [name]  has presented today for surgery, with the diagnosis of LEFT BREAST ATYPICAL DUCTAL HYPERPLASIA.  The various methods of treatment have been discussed with the patient and family. After consideration of risks, benefits and other options for treatment, the patient has consented to  Procedure(s): LEFT BREAST LUMPECTOMY WITH RADIOACTIVE SEED LOCALIZATION (Left) as a surgical intervention.  The patient's history has been reviewed, patient examined, no change in status, stable for surgery.  I have reviewed the patient's chart and labs.  Questions were answered to the patient's satisfaction.     [place]

## 2021-10-02 NOTE — Anesthesia Postprocedure Evaluation (Signed)
Anesthesia Post Note  Patient: [name]  Procedure(s) Performed: LEFT BREAST LUMPECTOMY WITH RADIOACTIVE SEED LOCALIZATION (Left: Breast)     Patient location during evaluation: PACU Anesthesia Type: General Level of consciousness: awake and alert Pain management: pain level controlled Vital Signs Assessment: post-procedure vital signs reviewed and stable Respiratory status: spontaneous breathing, nonlabored ventilation, respiratory function stable and patient connected to nasal cannula oxygen Cardiovascular status: blood pressure returned to baseline and stable Postop Assessment: no apparent nausea or vomiting Anesthetic complications: no   No notable events documented.  Last Vitals:  Vitals:   10/02/21 1100 10/02/21 1157  BP: 106/63 116/90  Pulse: (!) 59 64  Resp: 14 18  Temp:  (!) 36.1 C  SpO2: 98% 95%    Last Pain:  Vitals:   10/02/21 1157  TempSrc:   PainSc: 2                  Carlin Mamone L Kimberle Stanfill

## 2021-10-02 NOTE — Anesthesia Preprocedure Evaluation (Addendum)
Anesthesia Evaluation  Patient identified by MRN, date of birth, ID band Patient awake    Reviewed: Allergy & Precautions, NPO status , Patient's Chart, lab work & pertinent test results  Airway Mallampati: II  TM Distance: >3 FB Neck ROM: Full    Dental no notable dental hx. (+) Teeth Intact, Dental Advisory Given   Pulmonary neg pulmonary ROS, former smoker,    Pulmonary exam normal breath sounds clear to auscultation       Cardiovascular Normal cardiovascular exam Rhythm:Regular Rate:Normal  HLD   Neuro/Psych negative neurological ROS  negative psych ROS   GI/Hepatic negative GI ROS, Neg liver ROS,   Endo/Other  negative endocrine ROS  Renal/GU negative Renal ROS  negative genitourinary   Musculoskeletal  (+) Arthritis ,   Abdominal   Peds  Hematology negative hematology ROS (+)   Anesthesia Other Findings   Reproductive/Obstetrics                            Anesthesia Physical Anesthesia Plan  ASA: 2  Anesthesia Plan: General   Post-op Pain Management: Tylenol PO (pre-op)*   Induction: Intravenous  PONV Risk Score and Plan: Ondansetron, Dexamethasone and Midazolam  Airway Management Planned: LMA  Additional Equipment:   Intra-op Plan:   Post-operative Plan: Extubation in OR  Informed Consent: I have reviewed the patients History and Physical, chart, labs and discussed the procedure including the risks, benefits and alternatives for the proposed anesthesia with the patient or authorized representative who has indicated his/her understanding and acceptance.     Dental advisory given  Plan Discussed with: CRNA  Anesthesia Plan Comments:         Anesthesia Quick Evaluation

## 2021-10-02 NOTE — Anesthesia Procedure Notes (Signed)
Procedure Name: LMA Insertion Date/Time: 10/02/2021 9:36 AM Performed by: Glory Buff, CRNA Pre-anesthesia Checklist: Patient identified, Emergency Drugs available, Suction available and Patient being monitored Patient Re-evaluated:Patient Re-evaluated prior to induction Oxygen Delivery Method: Circle system utilized Preoxygenation: Pre-oxygenation with 100% oxygen Induction Type: IV induction LMA: LMA inserted LMA Size: 4.0 Number of attempts: 1 Placement Confirmation: positive ETCO2 Tube secured with: Tape Dental Injury: Teeth and Oropharynx as per pre-operative assessment

## 2021-10-02 NOTE — Op Note (Signed)
Preoperative diagnosis: Left breast atypical ductal hyperplasia upper outer quadrant  Postoperative diagnosis: Same  Procedure: Left breast seed localized lumpectomy  Surgeon: Erroll Luna, MD  Anesthesia: General with 0.25% Marcaine plain  EBL: Minimal  Specimen: Left breast tissue with seed and clip verified with the Faxitron  Drains: None  IV fluids: Per anesthesia record  Indications for procedure: The patient presents for excision of left breast abnormality noted on recent mammogram and core biopsy.  She had an area of atypical ductal hyperplasia and excision was recommended to exclude potential upgrade risk of over 20% malignancy.  Risks and benefits as well as nonoperative management strategies discussed and the pathophysiology and natural history of atypical ductal hyperplasia discussed.The procedure has been discussed with the patient. Alternatives to surgery have been discussed with the patient.  Risks of surgery include bleeding,  Infection,  Seroma formation, death,  and the need for further surgery.   The patient understands and wishes to proceed.     Description of procedure: The patient was met in the holding area and questions were answered.  The left breast was marked as the correct site and films were available for review.  Of note the seed was placed as an outpatient.  All questions were answered.  She was taken back to the operating.  She was placed supine upon the OR table.  After induction of general esthesia, left breast was prepped and draped in sterile fashion and timeout performed.  Proper patient, site and procedure verified.  Films were available for review in the operating room.  Neoprobe used to identify the seed in the upper outer quadrant left breast.  A curvilinear incision was made.  All tissue and the seed and clip were excised with a grossly negative margin.  Hemostasis was achieved.  Irrigation was used.  Local anesthetic infiltrated.  The wound was closed  in layers with a deep layer 3-0 Vicryl.  4 Monocryl was used to close the skin in a subcuticular fashion.  Dermabond applied.  All counts found to be correct.  Breast binder placed.  Patient was then awoke extubated taken to recovery in satisfactory condition.

## 2021-10-02 NOTE — Discharge Instructions (Addendum)
[facility] Office Phone Number [phone]  BREAST BIOPSY/ PARTIAL MASTECTOMY: POST OP INSTRUCTIONS  Always review your discharge instruction sheet given to you by the facility where your surgery was performed.  IF YOU HAVE DISABILITY OR FAMILY LEAVE FORMS, YOU MUST BRING THEM TO THE OFFICE FOR PROCESSING.  DO NOT GIVE THEM TO YOUR DOCTOR.  A prescription for pain medication may be given to you upon discharge.  Take your pain medication as prescribed, if needed.  If narcotic pain medicine is not needed, then you may take acetaminophen (Tylenol) or ibuprofen (Advil) as needed. Take your usually prescribed medications unless otherwise directed If you need a refill on your pain medication, please contact your pharmacy.  They will contact our office to request authorization.  Prescriptions will not be filled after 5pm or on week-ends. You should eat very light the first 24 hours after surgery, such as soup, crackers, pudding, etc.  Resume your normal diet the day after surgery. Most patients will experience some swelling and bruising in the breast.  Ice packs and a good support bra will help.  Swelling and bruising can take several days to resolve.  It is common to experience some constipation if taking pain medication after surgery.  Increasing fluid intake and taking a stool softener will usually help or prevent this problem from occurring.  A mild laxative (Milk of Magnesia or Miralax) should be taken according to package directions if there are no bowel movements after 48 hours. Unless discharge instructions indicate otherwise, you may remove your bandages 24-48 hours after surgery, and you may shower at that time.  You may have steri-strips (small skin tapes) in place directly over the incision.  These strips should be left on the skin for 7-10 days.  If your surgeon used skin glue on the incision, you may shower in 24 hours.  The glue will flake off over the next 2-3 weeks.  Any  sutures or staples will be removed at the office during your follow-up visit. ACTIVITIES:  You may resume regular daily activities (gradually increasing) beginning the next day.  Wearing a good support bra or sports bra minimizes pain and swelling.  You may have sexual intercourse when it is comfortable. You may drive when you no longer are taking prescription pain medication, you can comfortably wear a seatbelt, and you can safely maneuver your car and apply brakes. RETURN TO WORK:  ______________________________________________________________________________________ You should see your doctor in the office for a follow-up appointment approximately two weeks after your surgery.  Your doctor's nurse will typically make your follow-up appointment when she calls you with your pathology report.  Expect your pathology report 2-3 business days after your surgery.  You may call to check if you do not hear from us after three days. OTHER INSTRUCTIONS: _______________________________________________________________________________________________ _____________________________________________________________________________________________________________________________________ _____________________________________________________________________________________________________________________________________ _____________________________________________________________________________________________________________________________________  WHEN TO CALL YOUR DOCTOR: Fever over 101.0 Nausea and/or vomiting. Extreme swelling or bruising. Continued bleeding from incision. Increased pain, redness, or drainage from the incision.  The clinic staff is available to answer your questions during regular business hours.  Please don't hesitate to call and ask to speak to one of the nurses for clinical concerns.  If you have a medical emergency, go to the nearest emergency room or call 911.  A surgeon from [facility] is always on call at the hospital.  For further questions, please visit [email or web address]    Post Anesthesia Home Care Instructions  Activity: Get plenty of rest for the remainder of   remainder of the day. A responsible individual must stay with you for 24 hours following the procedure.  For the next 24 hours, DO NOT: -Drive a car -Paediatric nurse -Drink alcoholic beverages -Take any medication unless instructed by your physician -Make any legal decisions or sign important papers.  Meals: Start with liquid foods such as gelatin or soup. Progress to regular foods as tolerated. Avoid greasy, spicy, heavy foods. If nausea and/or vomiting occur, drink only clear liquids until the nausea and/or vomiting subsides. Call your physician if vomiting continues.  Special Instructions/Symptoms: Your throat may feel dry or sore from the anesthesia or the breathing tube placed in your throat during surgery. If this causes discomfort, gargle with warm salt water. The discomfort should disappear within 24 hours.  If you had a scopolamine patch placed behind your ear for the management of post- operative nausea and/or vomiting:  1. The medication in the patch is effective for 72 hours, after which it should be removed.  Wrap patch in a tissue and discard in the trash. Wash hands thoroughly with soap and water. 2. You may remove the patch earlier than 72 hours if you experience unpleasant side effects which may include dry mouth, dizziness or visual disturbances. 3. Avoid touching the patch. Wash your hands with soap and water after contact with the patch.  No tylenol or ibuprofen until after 2:30pm if needed today.

## 2021-10-03 ENCOUNTER — Encounter (HOSPITAL_BASED_OUTPATIENT_CLINIC_OR_DEPARTMENT_OTHER): Payer: Self-pay | Admitting: Surgery

## 2021-10-08 LAB — SURGICAL PATHOLOGY

## 2021-10-09 ENCOUNTER — Encounter: Payer: Self-pay | Admitting: Surgery

## 2021-10-15 ENCOUNTER — Other Ambulatory Visit: Payer: Self-pay | Admitting: Physician Assistant

## 2021-10-15 MED ORDER — ALBUTEROL SULFATE HFA 108 (90 BASE) MCG/ACT IN AERS: 2.0000 | INHALATION_SPRAY | Freq: Four times a day (QID) | RESPIRATORY_TRACT | 0 refills | Status: DC | PRN

## 2021-10-15 NOTE — Telephone Encounter (Signed)
Okay. Thank you for the update.

## 2021-10-15 NOTE — Telephone Encounter (Signed)
I will refill this 1 more time. Please tell patient that I am referring her to Pulmonology for evaluation of her lung function. Thank you.

## 2021-10-15 NOTE — Telephone Encounter (Signed)
Pt is requesting refill on Albuterol inhaler

## 2021-10-17 ENCOUNTER — Encounter (HOSPITAL_COMMUNITY): Payer: Self-pay

## 2021-10-22 DIAGNOSIS — Z9012 Acquired absence of left breast and nipple: Secondary | ICD-10-CM | POA: Diagnosis not present

## 2021-11-08 ENCOUNTER — Encounter: Payer: Self-pay | Admitting: Internal Medicine

## 2021-11-18 DIAGNOSIS — Z9012 Acquired absence of left breast and nipple: Secondary | ICD-10-CM | POA: Diagnosis not present

## 2021-11-19 DIAGNOSIS — Z9012 Acquired absence of left breast and nipple: Secondary | ICD-10-CM | POA: Diagnosis not present

## 2021-11-25 DIAGNOSIS — Z9012 Acquired absence of left breast and nipple: Secondary | ICD-10-CM | POA: Diagnosis not present

## 2021-11-27 ENCOUNTER — Ambulatory Visit: Payer: BC Managed Care – PPO | Admitting: Family Medicine

## 2021-11-28 ENCOUNTER — Ambulatory Visit (INDEPENDENT_AMBULATORY_CARE_PROVIDER_SITE_OTHER): Payer: BC Managed Care – PPO | Admitting: Medical

## 2021-11-28 ENCOUNTER — Encounter: Payer: Self-pay | Admitting: Medical

## 2021-11-28 VITALS — BP 110/70 | HR 95 | Wt 212.0 lb

## 2021-11-28 DIAGNOSIS — S76311A Strain of muscle, fascia and tendon of the posterior muscle group at thigh level, right thigh, initial encounter: Secondary | ICD-10-CM

## 2021-11-28 DIAGNOSIS — M79604 Pain in right leg: Secondary | ICD-10-CM | POA: Diagnosis not present

## 2021-11-28 DIAGNOSIS — H00014 Hordeolum externum left upper eyelid: Secondary | ICD-10-CM

## 2021-11-28 MED ORDER — ERYTHROMYCIN 5 MG/GM OP OINT: 1.0000 | TOPICAL_OINTMENT | Freq: Every day | OPHTHALMIC | 0 refills | Status: DC

## 2021-11-28 NOTE — Progress Notes (Signed)
Subjective:  [name] is a 56 y.o. female who presents for Chief Complaint  Patient presents with   Acute Visit    Possible stye on left eye. And also pain in the back of right thigh.   here for 2 concerns.  She notes a stye of the left upper eyelid.  Been there several days.  It is little irritating and she notes redness even before this.  She feels like a film over her eye when she gets in the morning but no drainage or itching or pus.  She does use fake eyelashes.   Otherwise eyes fine.  No blurred vision, no vision change.    She notes a tender spot on the back of her right leg just above the knee, possibly and not.  It does hurt some to bend the leg.  She was worried about a blood clot.  She had a breast biopsy and breast surgery back in May and was worried about possibly a blood clot from that.  No prior blood clot.  No smoking, not on hormone therapy.  No other injury or trauma.  No other complaint of the leg.  No other aggravating or relieving factors.    No other c/o.   Past Medical History:  Diagnosis Date   Anemia    Encounter for routine gynecological examination    Dr. Charlesetta Ortiz   Family history of breast cancer    Family history of lung cancer    History of blood transfusion 2009   d/t Hg ~4   Hx of adenomatous colonic polyps 04/21/2017   Hyperlipidemia 2014   Prediabetes    SVD (spontaneous vaginal delivery)    x 2   Ulcer as a child   gastric ulcer, unable to take aspirin   Uterine fibroid    Current Outpatient Medications on File Prior to Visit  Medication Sig Dispense Refill   acetaminophen (TYLENOL) 325 MG tablet Take 650 mg by mouth every 6 (six) hours as needed.     albuterol (VENTOLIN HFA) 108 (90 Base) MCG/ACT inhaler Inhale 2 puffs into the lungs every 6 (six) hours as needed for wheezing or shortness of breath. Last refill, if symptoms persist will need evaluation by Pulmonology for COPD 8 g 0   calcium carbonate (OS-CAL) 1250 (500 Ca) MG  chewable tablet Chew 1 tablet by mouth daily.     cetirizine (ZYRTEC) 10 MG tablet Take 10 mg by mouth daily.     Multiple Vitamin (MULTIVITAMIN) capsule Take 1 capsule by mouth daily.     simvastatin (ZOCOR) 40 MG tablet TAKE 1 TABLET BY MOUTH EVERY DAY 90 tablet 1   oxyCODONE (OXY IR/ROXICODONE) 5 MG immediate release tablet Take 1 tablet (5 mg total) by mouth every 6 (six) hours as needed for severe pain. (Patient not taking: Reported on 11/28/2021) 15 tablet 0   No current facility-administered medications on file prior to visit.    Past Surgical History:  Procedure Laterality Date   BREAST BIOPSY Left 04/2019   ADH w/ calcs   BREAST BIOPSY Right 04/2019   fibrocystic change w/ calcs   BREAST EXCISIONAL BIOPSY Left 06/07/2019   atypical ductal hyperplasia (cm)    BREAST LUMPECTOMY WITH RADIOACTIVE SEED LOCALIZATION Left 06/07/2019   Procedure: LEFT BREAST LUMPECTOMY WITH RADIOACTIVE SEED LOCALIZATION;  Surgeon: Amy Luna, MD;  Location: [place];  Service: General;  Laterality: Left;   BREAST LUMPECTOMY WITH RADIOACTIVE SEED LOCALIZATION Left 10/02/2021   Procedure: LEFT BREAST LUMPECTOMY  WITH RADIOACTIVE SEED LOCALIZATION;  Surgeon: Amy Luna, MD;  Location: [place];  Service: General;  Laterality: Left;   DILATATION & CURRETTAGE/HYSTEROSCOPY WITH RESECTOCOPE N/A 10/31/2014   Procedure: DILATATION & CURETTAGE/HYSTEROSCOPY WITH RESECTOCOPE, RESECTION OF POLYP WITH MYOSURE ;  Surgeon: Amy Givens, MD;  Location: [facility];  Service: Gynecology;  Laterality: N/A;   ECTOPIC PREGNANCY SURGERY     MYOMECTOMY     TONSILLECTOMY  as a child   TUBAL LIGATION     WISDOM TOOTH EXTRACTION Bilateral     The following portions of the patient's history were reviewed and updated as appropriate: allergies, current medications, past family history, past medical history, past social history, past surgical history and problem list.  ROS Otherwise as in  subjective above  Objective: BP 110/70   Pulse 95   Wt 212 lb (96.2 kg)   LMP 10/08/2011   SpO2 97%   BMI 34.22 kg/m   General appearance: alert, no distress, well developed, well nourished HEENT: normocephalic, sclerae anicteric, left upper eyelid with round raised consistent with stye, slight erythema to conjunctive, otherwise no pus no watery drainage, there is fake eyelashes in place glued on her natural eyelashes PERRLA, EOM MSK: tender over right distal hamstring, but no other swelling or deformity.  Leg ROM normal Legs neurovascularly intact pulses: 2+ radial pulses, 2+ pedal pulses, normal cap refill Ext: no edema   Assessment: Encounter Diagnoses  Name Primary?   Hordeolum of left upper eyelid, unspecified hordeolum type Yes   Right leg pain    Strain of right hamstring, initial encounter      Plan: Stye -discussed diagnosis.  Continue warm moist compresses.  Begin ointment below.  If not resolving over the next week then will need to go see an eye doctor.  Consider not using the fake eyelashes because the glue could be blocking up some of her meibomian glands.  Right leg pain - advised conservative care with relative rest, heat, massage, stretching, and if not resolving within 2 weeks let me know.  Examined by Dr. Redmond Ortiz supervising physician as well  [name] was seen today for acute visit.  Diagnoses and all orders for this visit:  Hordeolum of left upper eyelid, unspecified hordeolum type  Right leg pain  Strain of right hamstring, initial encounter  Other orders -     erythromycin ophthalmic ointment; Place 1 Application into the left eye at bedtime.    Follow up: prn

## 2021-12-18 ENCOUNTER — Other Ambulatory Visit: Payer: Self-pay | Admitting: Surgery

## 2021-12-18 DIAGNOSIS — Z1239 Encounter for other screening for malignant neoplasm of breast: Secondary | ICD-10-CM

## 2021-12-25 ENCOUNTER — Encounter: Payer: Self-pay | Admitting: Podiatry

## 2021-12-25 ENCOUNTER — Ambulatory Visit (INDEPENDENT_AMBULATORY_CARE_PROVIDER_SITE_OTHER): Payer: BC Managed Care – PPO | Admitting: Podiatry

## 2021-12-25 DIAGNOSIS — M722 Plantar fascial fibromatosis: Secondary | ICD-10-CM

## 2021-12-25 MED ORDER — DICLOFENAC SODIUM 75 MG PO TBEC: 75.0000 mg | DELAYED_RELEASE_TABLET | Freq: Two times a day (BID) | ORAL | 0 refills | Status: DC

## 2021-12-25 MED ORDER — DEXAMETHASONE SODIUM PHOSPHATE 120 MG/30ML IJ SOLN
4.0000 mg | Freq: Once | INTRAMUSCULAR | Status: AC
  Administered 2021-12-25: 4 mg via INTRA_ARTICULAR

## 2021-12-25 NOTE — Progress Notes (Signed)
  Subjective:  Patient ID: [name], female    DOB: 06/25/1965,   MRN: [id number]  Chief Complaint  Patient presents with   Plantar Fasciitis    Constant left foot pain , throbbing and shooting pain , no swelling or redness when in pain resting foot help. Patient is not a diabetic. Patient states she received an injection the last visit and it helped or 1 week    56 y.o. female presents for concern of left foot pain and plantar fasciitis. Has sen Dr. Cannon Kettle in the past for this and has done PT, injections, anti-inflammatories and still has continued to have pain.  Relates it was doing better for a while but for the past month the pain has worsened in her foot.  Denies any other pedal complaints. Denies n/v/f/c.   Past Medical History:  Diagnosis Date   Anemia    Encounter for routine gynecological examination    Dr. Charlesetta Garibaldi   Family history of breast cancer    Family history of lung cancer    History of blood transfusion 2009   d/t Hg ~4   Hx of adenomatous colonic polyps 04/21/2017   Hyperlipidemia 2014   Prediabetes    SVD (spontaneous vaginal delivery)    x 2   Ulcer as a child   gastric ulcer, unable to take aspirin   Uterine fibroid     Objective:  Physical Exam: Vascular: DP/PT pulses 2/4 bilateral. CFT <3 seconds. Normal hair growth on digits. No edema.  Skin. No lacerations or abrasions bilateral feet.  Musculoskeletal: MMT 5/5 bilateral lower extremities in DF, PF, Inversion and Eversion. Deceased ROM in DF of ankle joint. Tender to medial calcaneal tubercle on the left. No pain along achilles PT or arch. Tight achilles tendon noted.  Neurological: Sensation intact to light touch.   Assessment:   1. Plantar fasciitis of left foot      Plan:  Patient was evaluated and treated and all questions answered. Discussed plantar fasciitis with patient.  X-rays reviewed and discussed with patient. No acute fractures or dislocations noted. Mild spurring noted  at inferior calcaneus.  Discussed treatment options including, ice, NSAIDS, supportive shoes, bracing, and stretching. Stretching exercises provided to be done on a daily basis.   Prescription for diclofenac provided and sent to pharmacy. Patient requesting injection today. Procedure note below.   Will have patient out of work until 9/27 due to the pain in her foot.  Info on inserts given.  Follow-up 6 weeks or sooner if any problems arise. In the meantime, encouraged to call the office with any questions, concerns, change in symptoms.   Procedure:  Discussed etiology, pathology, conservative vs. surgical therapies. At this time a plantar fascial injection was recommended.  The patient agreed and a sterile skin prep was applied.  An injection consisting of  dexamethasone and marcaine mixture was infiltrated at the point of maximal tenderness on the left Heel.  Bandaid applied. The patient tolerated this well and was given instructions for aftercare.    Lorenda Peck, DPM

## 2021-12-25 NOTE — Patient Instructions (Signed)

## 2021-12-31 ENCOUNTER — Ambulatory Visit
Admission: RE | Admit: 2021-12-31 | Discharge: 2021-12-31 | Disposition: A | Discharge status: Home / Self Care | Payer: No Typology Code available for payment source | Source: Ambulatory Visit | Attending: Surgery | Admitting: Surgery

## 2021-12-31 DIAGNOSIS — Z1239 Encounter for other screening for malignant neoplasm of breast: Secondary | ICD-10-CM

## 2021-12-31 MED ORDER — GADOBUTROL 1 MMOL/ML IV SOLN
10.0000 mL | Freq: Once | INTRAVENOUS | Status: AC | PRN
  Administered 2021-12-31: 10 mL via INTRAVENOUS

## 2022-01-09 ENCOUNTER — Encounter: Payer: Self-pay | Admitting: Family Medicine

## 2022-01-09 ENCOUNTER — Ambulatory Visit (INDEPENDENT_AMBULATORY_CARE_PROVIDER_SITE_OTHER): Payer: Self-pay | Admitting: Family Medicine

## 2022-01-09 VITALS — BP 112/70 | HR 72 | Ht 67.0 in | Wt 212.0 lb

## 2022-01-09 DIAGNOSIS — K7689 Other specified diseases of liver: Secondary | ICD-10-CM

## 2022-01-09 NOTE — Progress Notes (Signed)
Chief Complaint  Patient presents with   Follow-up    MRI results. Would like to go over in detail.    She had breast MRI done (ordered by Dr. Brantley Stage). She has had 2 benign lumpectomies. She gets MRI or mammogram (alternating) every 6 months).  She saw her MRI results, and was concerned about the liver findings (read on MyChart). Nobody called her with her results  Breast MRI 8/22 Ancillary findings: There are multiple oval T2 bright masses within the liver, compatible with cysts.   Chart was reviewed--it was noted that she had prior abd [place] and MRI: [place] 01/2021 IMPRESSION: 1. Hypoechoic mass in the right liver measures 1.6 cm and is not definitely cystic. When the patient is clinically stable and able to follow directions and hold their breath (preferably as an outpatient) further evaluation with dedicated abdominal MRI should be considered. 2. Increased liver echogenicity is consistent with hepatic steatosis.   MRI 01/2021 IMPRESSION: There are numerous fluid signal, nonenhancing lesions of varying sizes scattered throughout the liver parenchyma, consistent with benign cysts. With specific attention to the right lobe of the liver, there are no solid masses or suspicious contrast enhancement. No further follow-up or characterization is required.   Looking back in chart even further, saw CT abdomen 07/2008: IMPRESSION:  1.  No acute abdominal findings, mass lesions or adenopathy.  2.  Multiple low attenuation liver lesions consistent with benign  hepatic cysts.  3.  No renal or obstructing ureteral calculi.   She had normal LFT's 01/2021.  She has some residual slight discomfort at the left lateral breast where she had the lumpectomy in May. This was done for atypical ductal hyperplasia with calcifications.  PMH, PSH, SH reviewed  Outpatient Encounter Medications as of 01/09/2022  Medication Sig Note   Multiple Vitamin (MULTIVITAMIN) capsule Take 1 capsule by mouth daily.     simvastatin (ZOCOR) 40 MG tablet TAKE 1 TABLET BY MOUTH EVERY DAY    acetaminophen (TYLENOL) 325 MG tablet Take 650 mg by mouth every 6 (six) hours as needed. (Patient not taking: Reported on 01/09/2022) 01/09/2022: prn   albuterol (VENTOLIN HFA) 108 (90 Base) MCG/ACT inhaler Inhale 2 puffs into the lungs every 6 (six) hours as needed for wheezing or shortness of breath. Last refill, if symptoms persist will need evaluation by Pulmonology for COPD (Patient not taking: Reported on 01/09/2022) 01/09/2022: prn   calcium carbonate (OS-CAL) 1250 (500 Ca) MG chewable tablet Chew 1 tablet by mouth daily. (Patient not taking: Reported on 01/09/2022)    cetirizine (ZYRTEC) 10 MG tablet Take 10 mg by mouth daily. (Patient not taking: Reported on 01/09/2022)    diclofenac (VOLTAREN) 75 MG EC tablet Take 1 tablet (75 mg total) by mouth 2 (two) times daily. (Patient not taking: Reported on 01/09/2022) 01/09/2022: prn   [DISCONTINUED] erythromycin ophthalmic ointment Place 1 Application into the left eye at bedtime.    [DISCONTINUED] oxyCODONE (OXY IR/ROXICODONE) 5 MG immediate release tablet Take 1 tablet (5 mg total) by mouth every 6 (six) hours as needed for severe pain. (Patient not taking: Reported on 11/28/2021)    No facility-administered encounter medications on file as of 01/09/2022.   Allergies  Allergen Reactions   Aspirin Nausea Only and Other (See Comments)    Stomach cramping   Ibuprofen     Pt stated, "Upsets stomach while I am on my cholesterol medicine"    ROS: no fever, chills, URI symptoms. No abdominal pain. Slight residual L lateral breast pain.  No other concerns.   PHYSICAL EXAM:  BP 112/70   Pulse 72   Ht '5\' 7"'$  (1.702 m)   Wt 212 lb (96.2 kg)   LMP 10/08/2011   BMI 33.20 kg/m   Wt Readings from Last 3 Encounters:  01/09/22 212 lb (96.2 kg)  11/28/21 212 lb (96.2 kg)  10/02/21 213 lb 6.5 oz (96.8 kg)   Well-appearing, pleasant female in no distress She is alert and oriented,  in good spirits. Cranial nerves grossly intact, conjunctiva and sclera are clear. Normal gait. Normal mood, affect, hygiene and grooming.  Results from above reviewed   ASSESSMENT/PLAN:  Hepatic cyst - incidental finding of hepatic cysts on breast MRI. Pt reassured that she has had e/o benign hepatic cysts dating back to 2010.

## 2022-01-15 ENCOUNTER — Encounter: Payer: Self-pay | Admitting: Internal Medicine

## 2022-02-04 ENCOUNTER — Ambulatory Visit (INDEPENDENT_AMBULATORY_CARE_PROVIDER_SITE_OTHER): Payer: BC Managed Care – PPO | Admitting: Podiatry

## 2022-02-04 DIAGNOSIS — Z91199 Patient's noncompliance with other medical treatment and regimen due to unspecified reason: Secondary | ICD-10-CM

## 2022-02-04 NOTE — Progress Notes (Signed)
No show

## 2022-02-14 ENCOUNTER — Other Ambulatory Visit: Payer: Self-pay | Admitting: Podiatry

## 2022-02-14 NOTE — Telephone Encounter (Signed)
Please advise 

## 2022-03-07 ENCOUNTER — Other Ambulatory Visit: Payer: Self-pay | Admitting: Physician Assistant

## 2022-03-25 ENCOUNTER — Encounter: Payer: Self-pay | Admitting: Internal Medicine

## 2022-04-10 ENCOUNTER — Encounter: Payer: Self-pay | Admitting: Internal Medicine

## 2022-04-14 ENCOUNTER — Other Ambulatory Visit (INDEPENDENT_AMBULATORY_CARE_PROVIDER_SITE_OTHER): Payer: 59

## 2022-04-14 ENCOUNTER — Encounter: Payer: Self-pay | Admitting: Family Medicine

## 2022-04-14 ENCOUNTER — Telehealth: Payer: 59 | Admitting: Family Medicine

## 2022-04-14 VITALS — Ht 67.0 in | Wt 205.0 lb

## 2022-04-14 DIAGNOSIS — R6889 Other general symptoms and signs: Secondary | ICD-10-CM

## 2022-04-14 DIAGNOSIS — R52 Pain, unspecified: Secondary | ICD-10-CM

## 2022-04-14 DIAGNOSIS — R059 Cough, unspecified: Secondary | ICD-10-CM

## 2022-04-14 LAB — POC COVID19 BINAXNOW: SARS Coronavirus 2 Ag: NEGATIVE

## 2022-04-14 LAB — POCT INFLUENZA A/B
Influenza A, POC: NEGATIVE
Influenza B, POC: NEGATIVE

## 2022-04-14 MED ORDER — OSELTAMIVIR PHOSPHATE 75 MG PO CAPS: 75.0000 mg | ORAL_CAPSULE | Freq: Two times a day (BID) | ORAL | 0 refills | Status: DC

## 2022-04-14 NOTE — Progress Notes (Signed)
Start time: 11:44 End time: 11:56  Virtual Visit via Video Note  I connected with [name] on 04/14/22 by a video enabled telemedicine application and verified that I am speaking with the correct person using two identifiers.  Location: Patient: home Provider: office   I discussed the limitations of evaluation and management by telemedicine and the availability of in person appointments. The patient expressed understanding and agreed to proceed.  History of Present Illness:  Chief Complaint  Patient presents with   Cough    VIRTUAL cough, body aches and HA. Not sure if she has a fever but has chills-does not have a thermometer. Symptoms started this morning, has not done any home covid tests.    Woke up this morning with body aches, headache, cough, chills. No head congestion or sinus pain. HA only with coughing. Cough hasn't been productive.  No sick contacts. Works in [employer], +sick babies last week.  Has albuterol from prior viral illness. No h/o asthma. Denies wheezing, shortness of breath.  Took coricidin this morning.  Had first 2 COVID vaccines, no boosters. Has never had COVID  PMH, PSH, SH reviewed  Outpatient Encounter Medications as of 04/14/2022  Medication Sig Note   DM-APAP-CPM (CORICIDIN HBP PO) Take 2 tablets by mouth every 4 (four) hours. 04/14/2022: Last dose 10:00am   Multiple Vitamin (MULTIVITAMIN) capsule Take 1 capsule by mouth daily.    oseltamivir (TAMIFLU) 75 MG capsule Take 1 capsule (75 mg total) by mouth 2 (two) times daily.    simvastatin (ZOCOR) 40 MG tablet TAKE 1 TABLET BY MOUTH EVERY DAY    acetaminophen (TYLENOL) 325 MG tablet Take 650 mg by mouth every 6 (six) hours as needed. (Patient not taking: Reported on 01/09/2022) 01/09/2022: prn   albuterol (VENTOLIN HFA) 108 (90 Base) MCG/ACT inhaler Inhale 2 puffs into the lungs every 6 (six) hours as needed for wheezing or shortness of breath. Last refill, if symptoms persist will need  evaluation by Pulmonology for COPD (Patient not taking: Reported on 01/09/2022) 01/09/2022: prn   calcium carbonate (OS-CAL) 1250 (500 Ca) MG chewable tablet Chew 1 tablet by mouth daily. (Patient not taking: Reported on 01/09/2022)    cetirizine (ZYRTEC) 10 MG tablet Take 10 mg by mouth daily. (Patient not taking: Reported on 01/09/2022)    diclofenac (VOLTAREN) 75 MG EC tablet TAKE 1 TABLET BY MOUTH TWICE A DAY (Patient not taking: Reported on 04/14/2022) 04/14/2022: prn   No facility-administered encounter medications on file as of 04/14/2022.   NOT taking Tamiflu prior to today's visit  Allergies  Allergen Reactions   Aspirin Nausea Only and Other (See Comments)    Stomach cramping   Ibuprofen     Pt stated, "Upsets stomach while I am on my cholesterol medicine"    ROS: +HA (with coughing), chills, myalgias, cough per HPI. No n/v/d, no sinus pain, ear pain, chest pain, shortness of breath. See HPI.   Observations/Objective:  Ht '5\' 7"'$  (1.702 m)   Wt 205 lb (93 kg)   LMP 10/08/2011   BMI 32.11 kg/m   Ill-appearing female (very tired), in no distress. Occasional cough. Speaking easily and comfortably. Cranial nerves grossly intact. Alert and oriented. Exam is limited due to virtual nature of the visit.  Patient came for flu and COVID tests--both of which were negative   Assessment and Plan:  Flu-like symptoms - +sick exposures at work; acute onset of symptoms, classic for flu. Tested negative, but recommended treating presumptively for flu  Supportive measures  reviewed in detail. Prior to getting results back, discussed that she wouldn't want antiviral med if +COVID, would be willing to take Tamiflu if influenza. Tested negative, but sx classic--recommended presumptive treatment with Tamiflu.   Drink plenty of water. Continue the Coricidin HBP. Do not take separate tylenol while taking coricidin. You may use advil or aleve if needed for fever or pain (take with  food). Take guaifenesin--this is the expectorant found in Mucinex.  Get the PLAIN 12 hour mucinex, and take this twice daily. Do not get the mucinex DM, as this contains the same cough suppressant that is in your Coricidn.  You don't want to double up on ingredients.    Follow Up Instructions:    I discussed the assessment and treatment plan with the patient. The patient was provided an opportunity to ask questions and all were answered. The patient agreed with the plan and demonstrated an understanding of the instructions.   The patient was advised to call back or seek an in-person evaluation if the symptoms worsen or if the condition fails to improve as anticipated.  I spent 18 minutes dedicated to the care of this patient, including pre-visit review of records, face to face time, post-visit ordering of testing and documentation.    Vikki Ports, MD

## 2022-05-01 IMAGING — MG MM BREAST BX W LOC DEV 1ST LESION IMAGE BX SPEC STEREO GUIDE*L*
8 of 12 series · 8 of 32 positions shown · non-contrast
Comparison: Previous exams.
COMPARISON: Previous exams.

Addendum:
CLINICAL DATA: Patient presents for stereotactic guided core biopsy
of LEFT breast calcifications. Patient has history of excised ADH in
the LEFT breast. Patient has family history of breast cancer in her
sister and cousin.

EXAM:
LEFT BREAST STEREOTACTIC CORE NEEDLE BIOPSY

[L (1 of 5)]
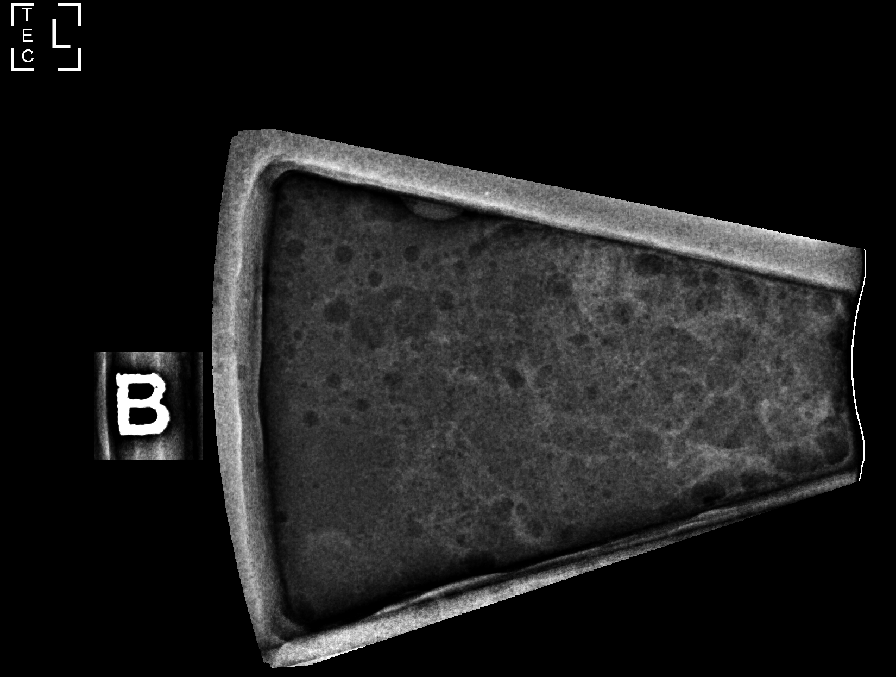

[L (2 of 5)]
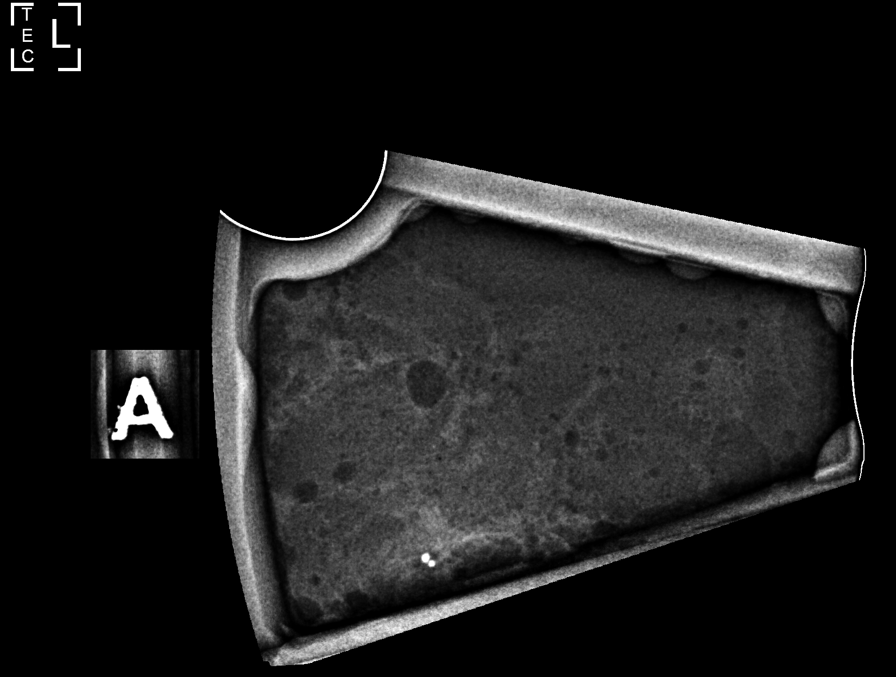

[L (3 of 5)]
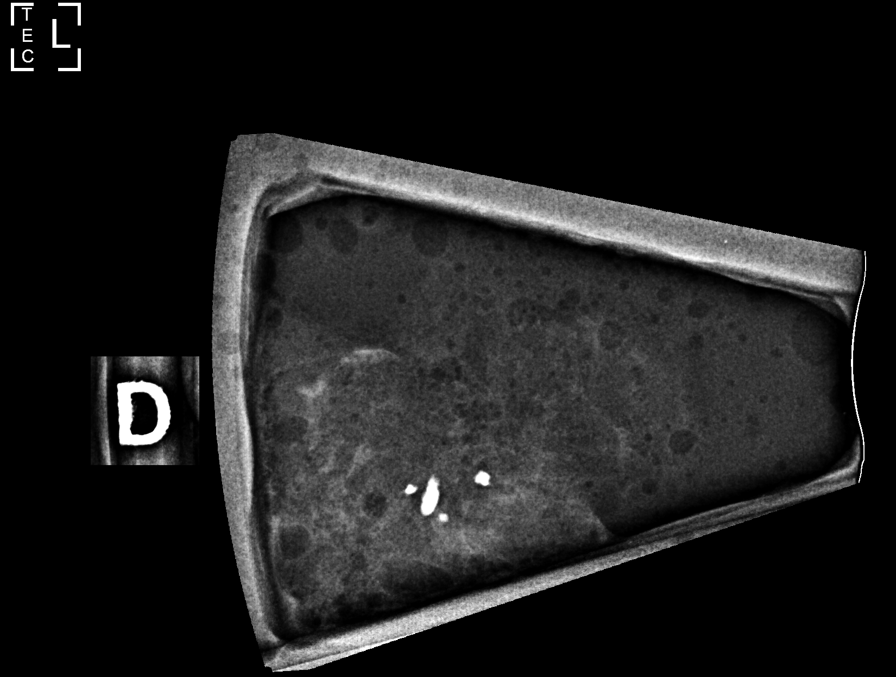

[L (4 of 5)]
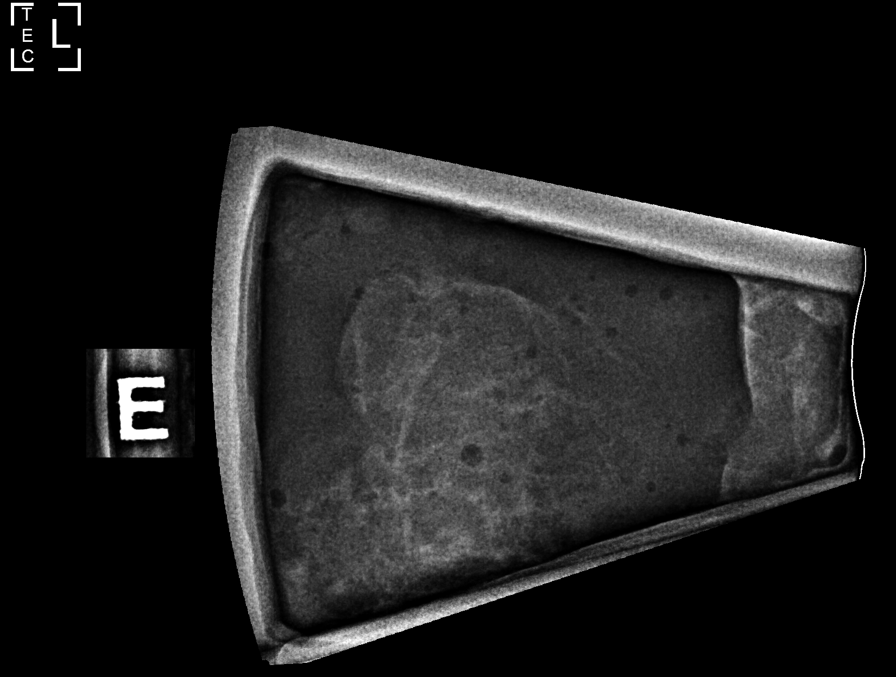

[L (5 of 5)]
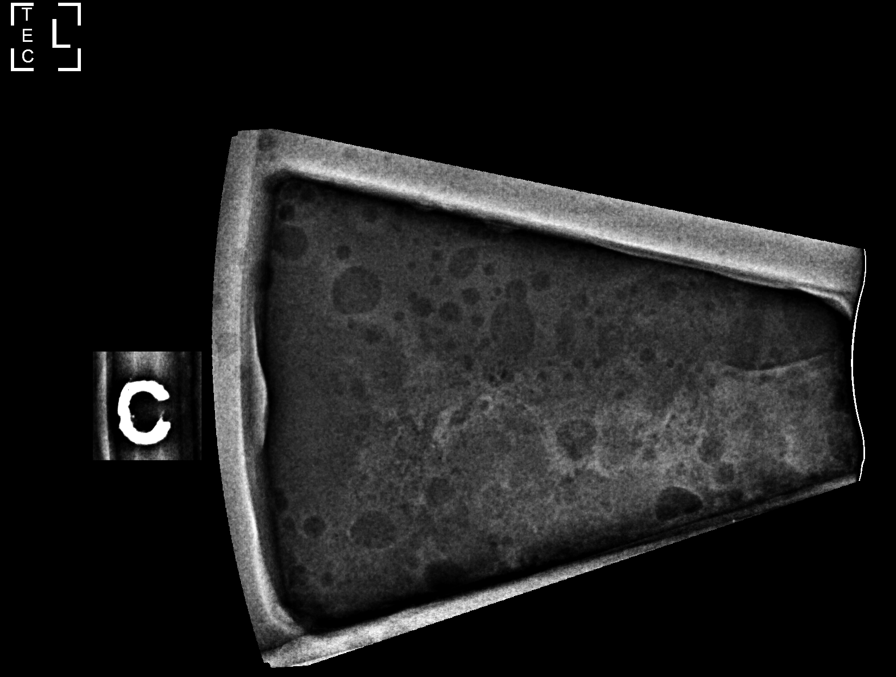

[L LM (1 of 2)]
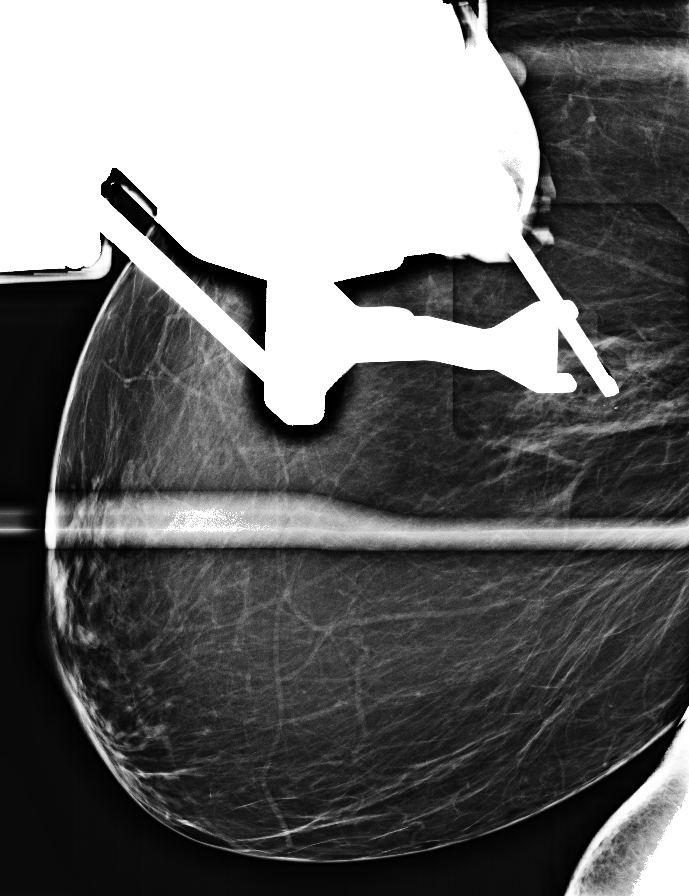

[L LM (2 of 2)]
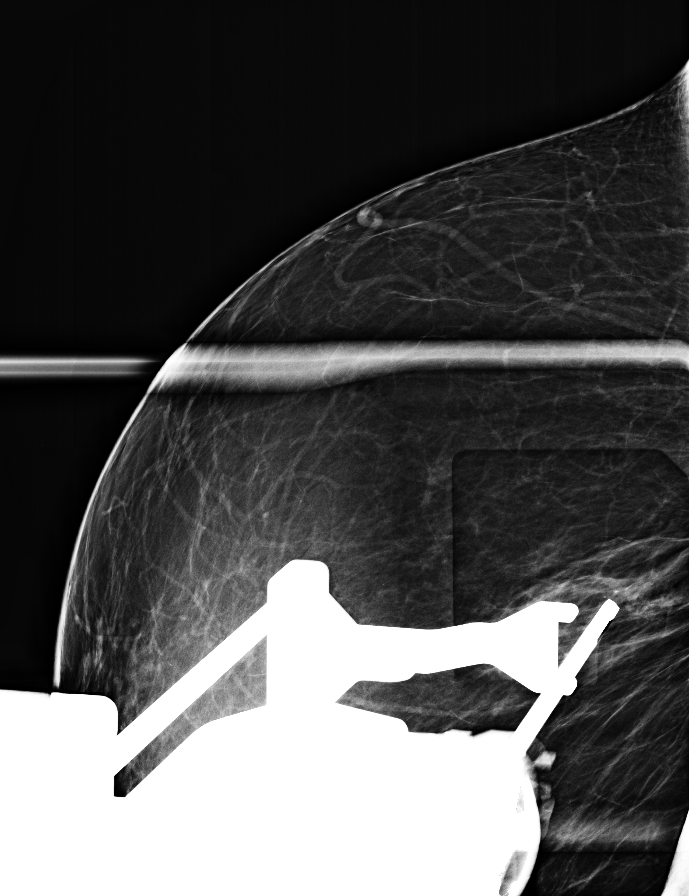

[L LM tomo · tomo slice 42/83.0]
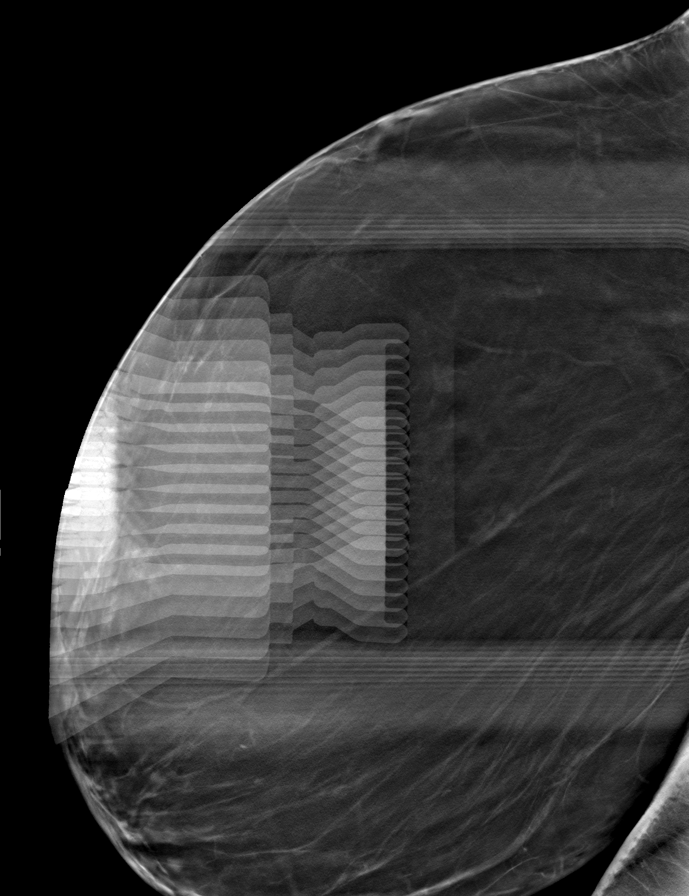

[8 of 32 positions shown; findings below may reference images not displayed]



Using sterile technique and 1% Lidocaine as local anesthetic, under
stereotactic guidance, a 9 gauge vacuum assisted device was used to
perform core needle biopsy of calcifications in the UPPER-OUTER
QUADRANT of the LEFT breast using a LATERAL to me approach. Specimen
radiograph was performed showing calcifications in numerous tissue
samples. Specimens with calcifications are identified for pathology.

Lesion quadrant: UPPER-OUTER QUADRANT LEFT breast

At the conclusion of the procedure, coil shaped tissue marker clip
was deployed into the biopsy cavity. Follow-up 2-view mammogram was
performed and dictated separately.
IMPRESSION: Stereotactic-guided biopsy of LEFT breast calcifications. No
apparent complications.

ADDENDUM:
Pathology revealed ATYPICAL DUCTAL HYPERPLASIA WITH CALCIFICATIONS
of the LEFT breast, upper outer quadrant, (coil clip). This was
found to be concordant by Dr. Maykel Zanders, with surgical consultation
for consideration of excision recommended.

Pathology results were discussed with the patient and her daughter
by telephone. The patient reported doing well after the biopsy with
tenderness and swelling at the site. Post biopsy instructions and
care were reviewed and questions were answered. The patient was
encouraged to call The [REDACTED] for any
additional concerns. My direct phone number was provided.

Surgical consultation has been arranged with Dr. Jazmin Davies, per
patient request, at [REDACTED] on July 15, 2021.

Pathology results reported by Karger Chato, RN on 07/09/2021.



Using sterile technique and 1% Lidocaine as local anesthetic, under
stereotactic guidance, a 9 gauge vacuum assisted device was used to
perform core needle biopsy of calcifications in the UPPER-OUTER
QUADRANT of the LEFT breast using a LATERAL to me approach. Specimen
radiograph was performed showing calcifications in numerous tissue
samples. Specimens with calcifications are identified for pathology.

Lesion quadrant: UPPER-OUTER QUADRANT LEFT breast

At the conclusion of the procedure, coil shaped tissue marker clip
was deployed into the biopsy cavity. Follow-up 2-view mammogram was
performed and dictated separately.
IMPRESSION: Stereotactic-guided biopsy of LEFT breast calcifications. No
apparent complications.

## 2022-05-01 IMAGING — MG MM BREAST LOCALIZATION CLIP
4 series · 4 of 12 positions shown · non-contrast
Comparison: Previous exam(s).

CLINICAL DATA: Status post stereotactic guided core biopsy of the
LEFT breast.

EXAM:
3D DIAGNOSTIC LEFT MAMMOGRAM POST STEREOTACTIC BIOPSY

[L LM synth-2D]
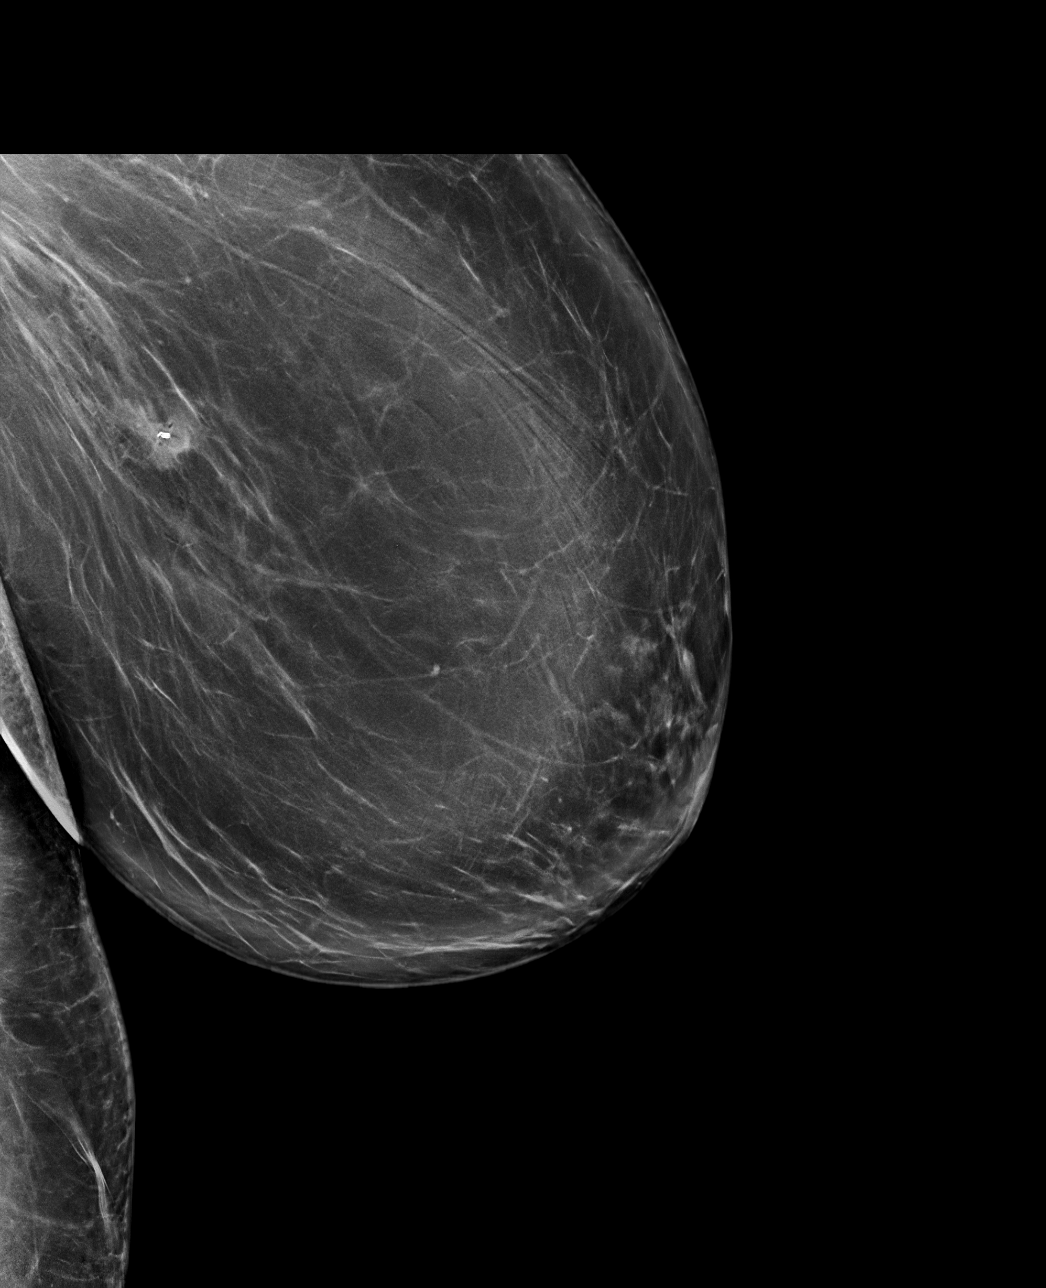

[L CC synth-2D]
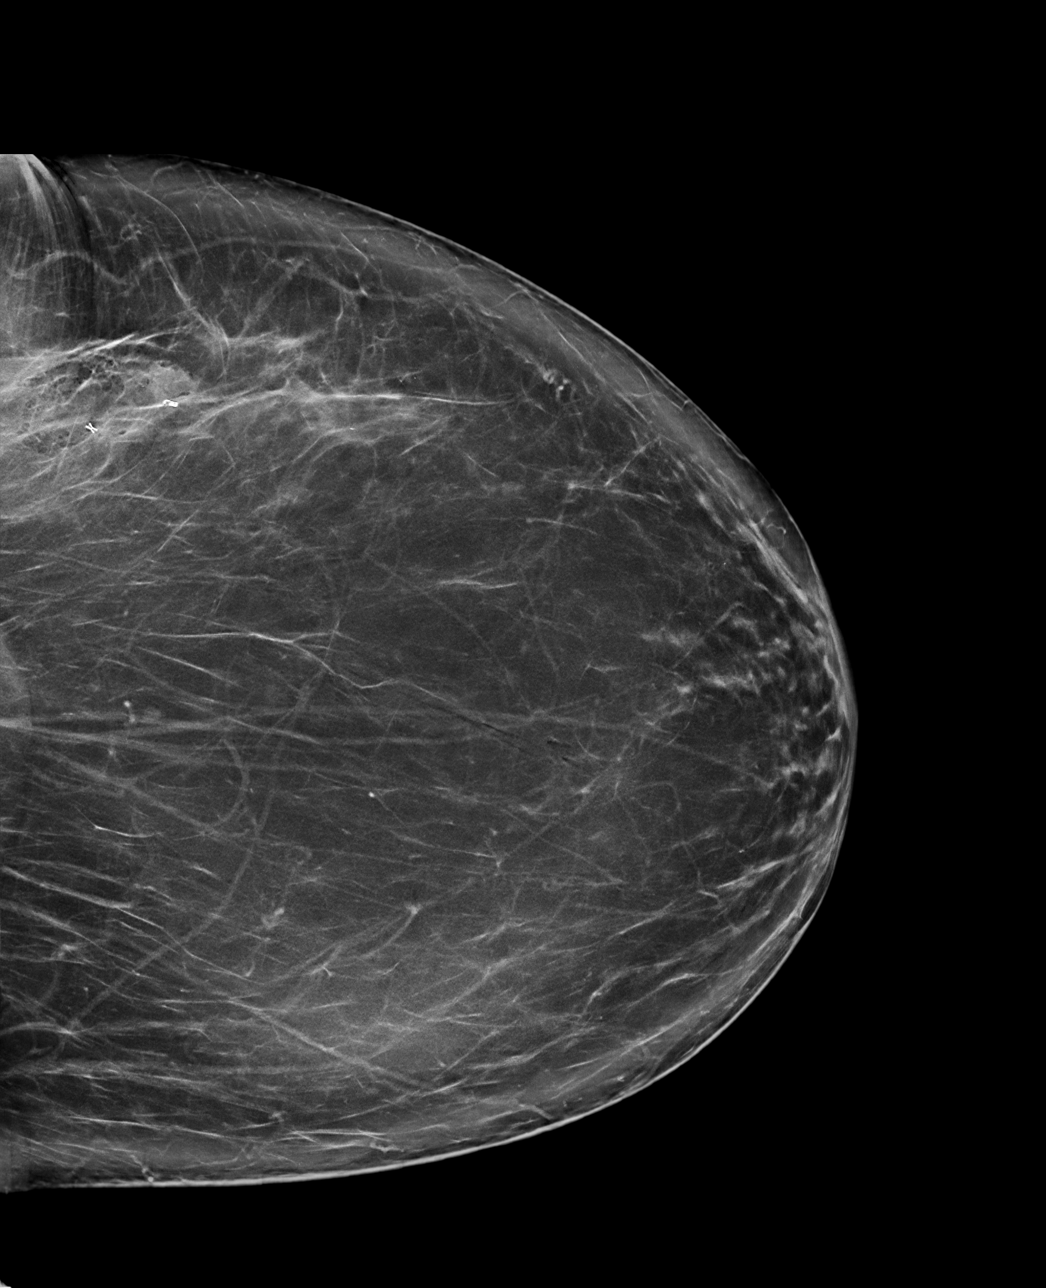

[L LM tomo · tomo slice 53/105.0]
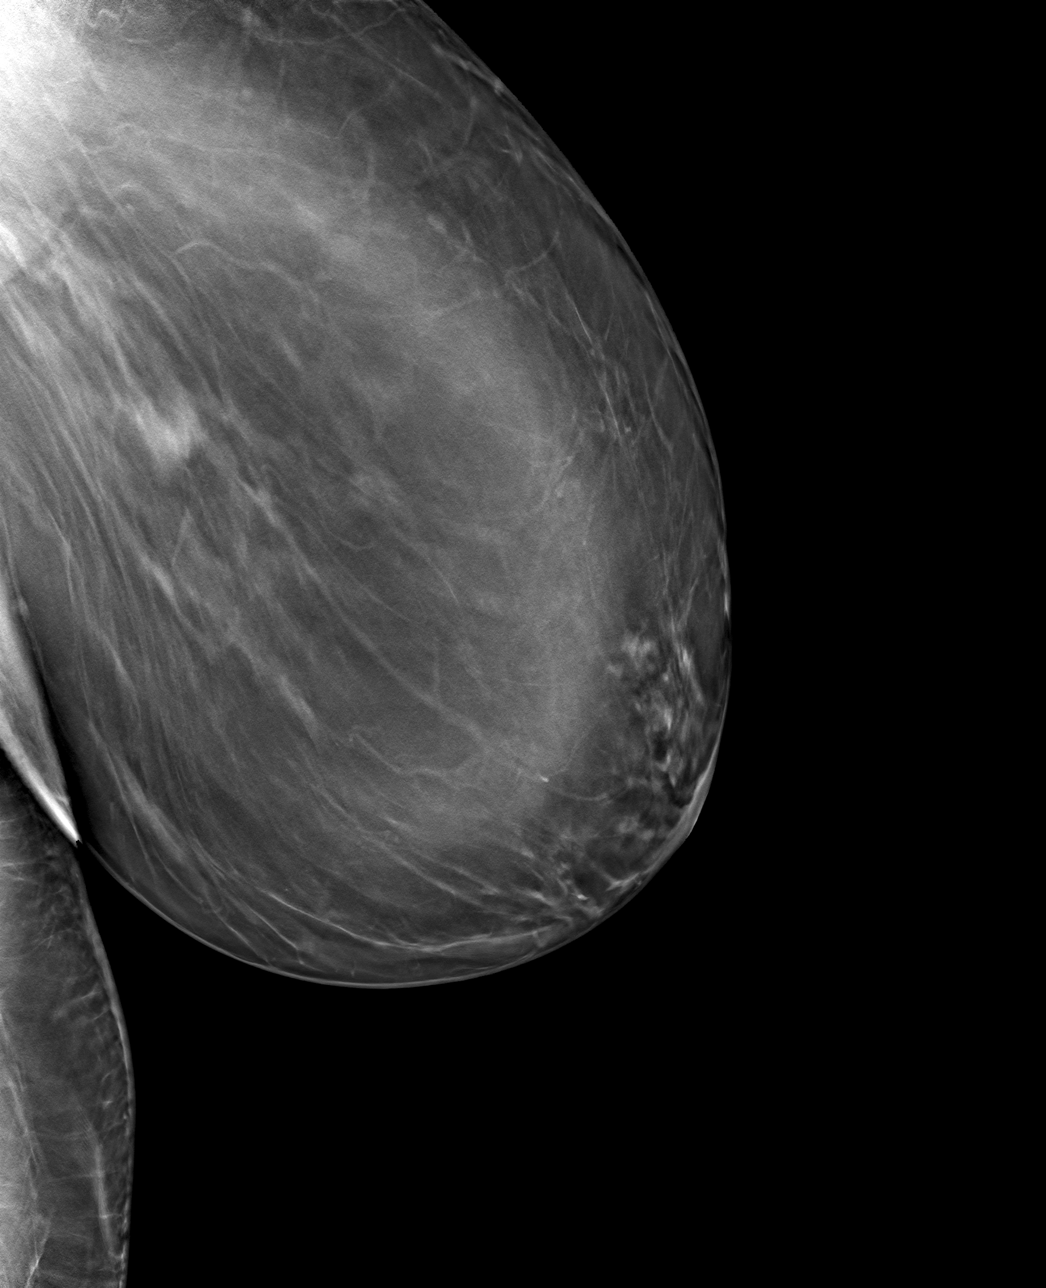

[L CC tomo · tomo slice 47/92.0]
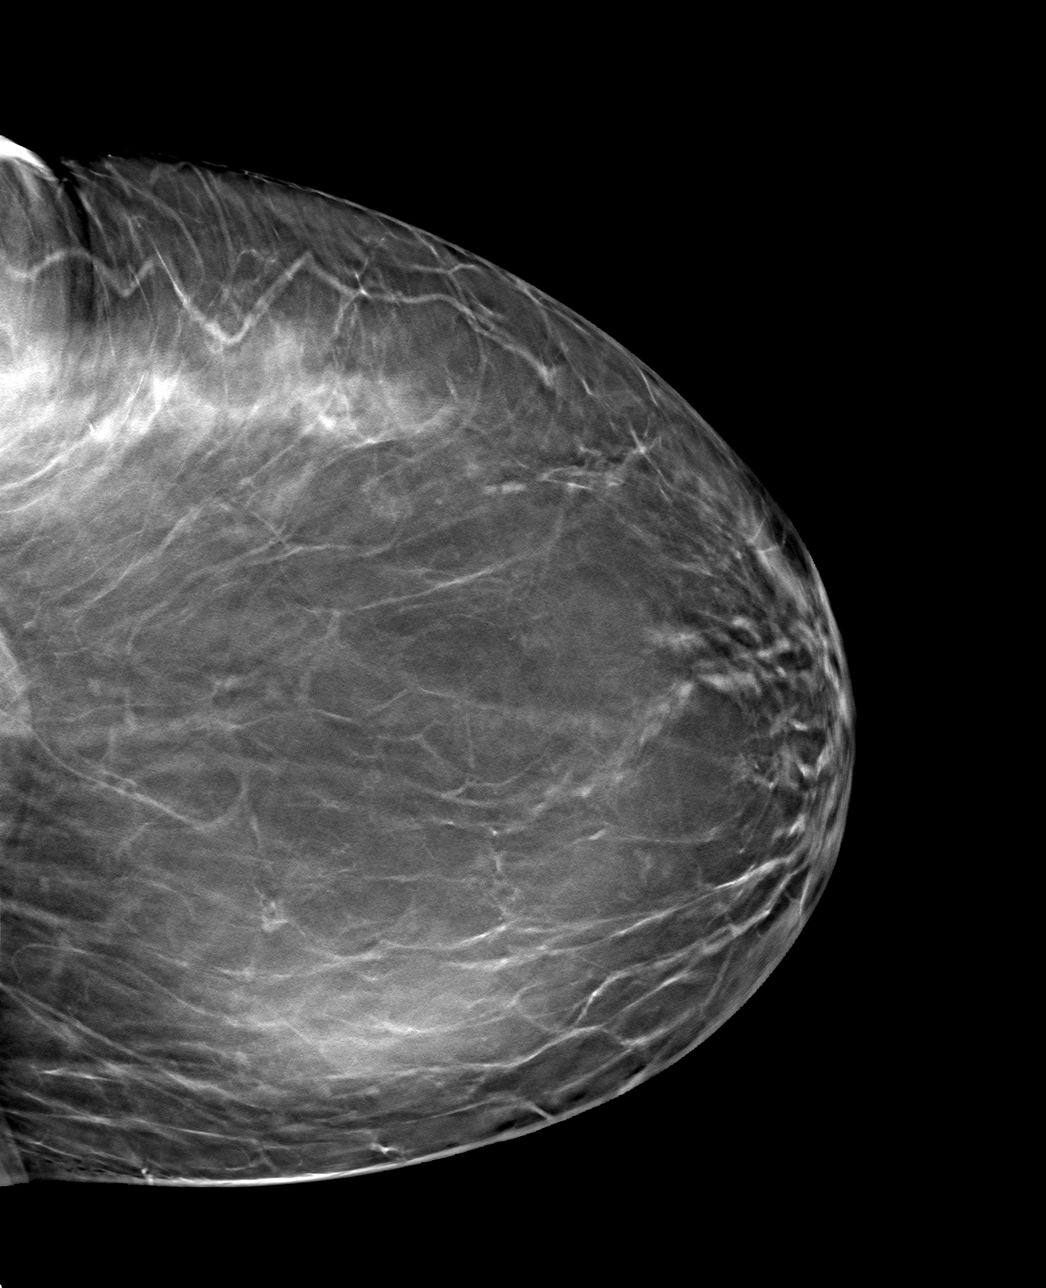

[4 of 12 positions shown; findings below may reference images not displayed]

FINDINGS: 3D Mammographic images were obtained following stereotactic guided
biopsy of calcifications in the UPPER-OUTER QUADRANT of the LEFT
breast and placement of a coil shaped. The biopsy marking clip is in
expected position at the site of biopsy. Remote biopsy was marked
with an X shaped clip.
IMPRESSION: Appropriate positioning of the coil shaped biopsy marking clip at
the site of biopsy in the UPPER-OUTER QUADRANT LEFT breast.

Final Assessment: Post Procedure Mammograms for Marker Placement

## 2022-05-12 LAB — HM COLONOSCOPY

## 2022-05-12 LAB — HM MAMMOGRAPHY: HM Mammogram: NORMAL (ref 0–4)

## 2022-05-19 ENCOUNTER — Other Ambulatory Visit: Payer: Self-pay | Admitting: Surgery

## 2022-05-19 DIAGNOSIS — Z1231 Encounter for screening mammogram for malignant neoplasm of breast: Secondary | ICD-10-CM

## 2022-05-26 ENCOUNTER — Encounter: Payer: Self-pay | Admitting: Internal Medicine

## 2022-06-06 ENCOUNTER — Ambulatory Visit (AMBULATORY_SURGERY_CENTER): Payer: Self-pay | Admitting: *Deleted

## 2022-06-06 VITALS — Ht 66.0 in | Wt 205.0 lb

## 2022-06-06 DIAGNOSIS — Z86010 Personal history of colonic polyps: Secondary | ICD-10-CM

## 2022-06-06 NOTE — Progress Notes (Signed)
No egg or soy allergy known to patient  No issues known to pt with past sedation with any surgeries or procedures Patient denies ever being told they had issues or difficulty with intubation  No FH of Malignant Hyperthermia Pt is not on diet pills Pt is not on  home 02  Pt is not on blood thinners  Pt denies issues with constipation  Pt is not on dialysis Pt denies any upcoming cardiac testing Pt encouraged to use to use Singlecare or Goodrx to reduce cost  Patient's chart reviewed by Osvaldo Angst CNRA prior to previsit and patient appropriate for the [place].  Previsit completed and red dot placed by patient's name on their procedure day (on provider's schedule).  . Visit by phone Instructions reviewed with pt and pt states understanding. Instructed to review again prior to procedure. Pt states they will.  Instructions sent by mail and my chartr

## 2022-06-19 ENCOUNTER — Encounter: Payer: Self-pay | Admitting: Internal Medicine

## 2022-06-30 ENCOUNTER — Telehealth: Payer: Self-pay | Admitting: Internal Medicine

## 2022-06-30 NOTE — Telephone Encounter (Signed)
Inbound call from patient stating that she is scheduled to have a colonoscopy on  2/21 at 1:30. Patient stated she was looking over her instructions and noticed she was not supposed to have corn. Patient stated she had an appetizer that had corn mixed in it and is wondering if that will affect her procedure. Patient is requesting a call back to discuss.  Please advise.

## 2022-06-30 NOTE — Telephone Encounter (Signed)
Spoke with pt; told her to push fluids until procedure.  Understanding voiced

## 2022-07-02 ENCOUNTER — Encounter: Payer: Self-pay | Admitting: Internal Medicine

## 2022-07-02 ENCOUNTER — Ambulatory Visit (AMBULATORY_SURGERY_CENTER): Payer: 59 | Admitting: Internal Medicine

## 2022-07-02 VITALS — BP 111/66 | HR 62 | Temp 98.7°F | Resp 10 | Ht 66.0 in | Wt 205.0 lb

## 2022-07-02 DIAGNOSIS — D120 Benign neoplasm of cecum: Secondary | ICD-10-CM

## 2022-07-02 DIAGNOSIS — D124 Benign neoplasm of descending colon: Secondary | ICD-10-CM

## 2022-07-02 DIAGNOSIS — D125 Benign neoplasm of sigmoid colon: Secondary | ICD-10-CM | POA: Diagnosis not present

## 2022-07-02 DIAGNOSIS — Z09 Encounter for follow-up examination after completed treatment for conditions other than malignant neoplasm: Secondary | ICD-10-CM | POA: Diagnosis not present

## 2022-07-02 DIAGNOSIS — Z86010 Personal history of colonic polyps: Secondary | ICD-10-CM

## 2022-07-02 DIAGNOSIS — K635 Polyp of colon: Secondary | ICD-10-CM

## 2022-07-02 MED ORDER — SODIUM CHLORIDE 0.9 % IV SOLN: 500.0000 mL | Freq: Once | INTRAVENOUS | Status: DC

## 2022-07-02 NOTE — Progress Notes (Signed)
Called to room to assist during endoscopic procedure.  Patient ID and intended procedure confirmed with present staff. Received instructions for my participation in the procedure from the performing physician.  

## 2022-07-02 NOTE — Op Note (Signed)
[facility] Patient Name: [name] Procedure Date: 07/02/2022 1:14 PM MRN: [id number] Endoscopist: Gatha Mayer , MD, [id number] Age: 57 Referring MD:  Date of Birth: 1965-09-04 Gender: Female Account #: [id number] Procedure:                Colonoscopy Indications:              Surveillance: Personal history of adenomatous                            polyps on last colonoscopy > 5 years ago, Last                            colonoscopy: 2018 Medicines:                Monitored Anesthesia Care Procedure:                Pre-Anesthesia Assessment:                           - Prior to the procedure, a History and Physical                            was performed, and patient medications and                            allergies were reviewed. The patient's tolerance of                            previous anesthesia was also reviewed. The risks                            and benefits of the procedure and the sedation                            options and risks were discussed with the patient.                            All questions were answered, and informed consent                            was obtained. Prior Anticoagulants: The patient has                            taken no anticoagulant or antiplatelet agents. ASA                            Grade Assessment: II - A patient with mild systemic                            disease. After reviewing the risks and benefits,                            the patient was deemed in satisfactory condition to  undergo the procedure.                           After obtaining informed consent, the colonoscope                            was passed under direct vision. Throughout the                            procedure, the patient's blood pressure, pulse, and                            oxygen saturations were monitored continuously. The                            Olympus CF-HQ190L [id number])  Colonoscope was                            introduced through the anus and advanced to the the                            cecum, identified by appendiceal orifice and                            ileocecal valve. The colonoscopy was somewhat                            difficult due to significant looping. Successful                            completion of the procedure was aided by applying                            abdominal pressure. The patient tolerated the                            procedure well. The quality of the bowel                            preparation was good. The ileocecal valve,                            appendiceal orifice, and rectum were photographed.                            The bowel preparation used was Miralax via split                            dose instruction. Scope In: 1:41:43 PM Scope Out: 2:02:42 PM Scope Withdrawal Time: 0 hours 13 minutes 18 seconds  Total Procedure Duration: 0 hours 20 minutes 59 seconds  Findings:                 The perianal and digital rectal examinations were  normal.                           Three sessile polyps were found in the sigmoid                            colon, descending colon and cecum. The polyps were                            diminutive in size. These polyps were removed with                            a cold snare. Resection and retrieval were                            complete. Verification of patient identification                            for the specimen was done. Estimated blood loss was                            minimal.                           A few small-mouthed diverticula were found in the                            sigmoid colon.                           The exam was otherwise without abnormality on                            direct and retroflexion views. Complications:            No immediate complications. Estimated Blood Loss:     Estimated blood loss was  minimal. Impression:               - Three diminutive polyps in the sigmoid colon, in                            the descending colon and in the cecum, removed with                            a cold snare. Resected and retrieved.                           - Diverticulosis in the sigmoid colon.                           - The examination was otherwise normal on direct                            and retroflexion views.                           -  Personal history of colonic polyps. 2 diminutive                            adenomas 2018                           - diffiocult IV placement today - 5 attempts                           - low sigmoid loop - low LLQ/pelvic abdominal                            pressure used to overcome this Recommendation:           - Patient has a contact number available for                            emergencies. The signs and symptoms of potential                            delayed complications were discussed with the                            patient. Return to normal activities tomorrow.                            Written discharge instructions were provided to the                            patient.                           - Resume previous diet.                           - Continue present medications.                           - Repeat colonoscopy is recommended. The                            colonoscopy date will be determined after pathology                            results from today's exam become available for                            review. Gatha Mayer, MD 07/02/2022 2:15:14 PM This report has been signed electronically.

## 2022-07-02 NOTE — Progress Notes (Signed)
[facility] History and Physical   Primary Care Physician:  Orma Render, NP   Reason for Procedure:   Hx colon polyps  Plan:    colonoscopy     HPI: [name] is a 57 y.o. female here for surveillance colonoscopy  2 adenomas 2018  She is having some left neck discomfort w/ tuning head after unsuccessful IV attempt today   Past Medical History:  Diagnosis Date   Anemia    Encounter for routine gynecological examination    Dr. Charlesetta Garibaldi   Family history of breast cancer    Family history of lung cancer    History of blood transfusion 2009   d/t Hg ~4   Hx of adenomatous colonic polyps 04/21/2017   Hyperlipidemia 2014   Prediabetes    SVD (spontaneous vaginal delivery)    x 2   Ulcer as a child   gastric ulcer, unable to take aspirin   Uterine fibroid     Past Surgical History:  Procedure Laterality Date   BREAST BIOPSY Left 04/2019   ADH w/ calcs   BREAST BIOPSY Right 04/2019   fibrocystic change w/ calcs   BREAST EXCISIONAL BIOPSY Left 06/07/2019   atypical ductal hyperplasia (cm)    BREAST LUMPECTOMY WITH RADIOACTIVE SEED LOCALIZATION Left 06/07/2019   Procedure: LEFT BREAST LUMPECTOMY WITH RADIOACTIVE SEED LOCALIZATION;  Surgeon: Erroll Luna, MD;  Location: [facility];  Service: General;  Laterality: Left;   BREAST LUMPECTOMY WITH RADIOACTIVE SEED LOCALIZATION Left 10/02/2021   Procedure: LEFT BREAST LUMPECTOMY WITH RADIOACTIVE SEED LOCALIZATION;  Surgeon: Erroll Luna, MD;  Location: [facility];  Service: General;  Laterality: Left;   DILATATION & CURRETTAGE/HYSTEROSCOPY WITH RESECTOCOPE N/A 10/31/2014   Procedure: [place], RESECTION OF POLYP WITH MYOSURE ;  Surgeon: Crawford Givens, MD;  Location: [facility];  Service: Gynecology;  Laterality: N/A;   ECTOPIC PREGNANCY SURGERY     MYOMECTOMY     TONSILLECTOMY  as a child   TUBAL LIGATION     WISDOM TOOTH  EXTRACTION Bilateral     Prior to Admission medications   Medication Sig Start Date End Date Taking? Authorizing Provider  Multiple Vitamin (MULTIVITAMIN) capsule Take 1 capsule by mouth daily.   Yes [provider]  simvastatin (ZOCOR) 40 MG tablet TAKE 1 TABLET BY MOUTH EVERY DAY 03/07/22  Yes Tysinger, Camelia Eng, PA-C  acetaminophen (TYLENOL) 325 MG tablet Take 650 mg by mouth every 6 (six) hours as needed.    [provider]  albuterol (VENTOLIN HFA) 108 (90 Base) MCG/ACT inhaler Inhale 2 puffs into the lungs every 6 (six) hours as needed for wheezing or shortness of breath. Last refill, if symptoms persist will need evaluation by Pulmonology for COPD Patient not taking: Reported on 01/09/2022 10/15/21   Francis Gaines B, PA-C  calcium carbonate (OS-CAL) 1250 (500 Ca) MG chewable tablet Chew 1 tablet by mouth daily. Patient not taking: Reported on 01/09/2022    [provider]  cetirizine (ZYRTEC) 10 MG tablet Take 10 mg by mouth daily. Patient not taking: Reported on 01/09/2022    [provider]  diclofenac (VOLTAREN) 75 MG EC tablet TAKE 1 TABLET BY MOUTH TWICE A DAY Patient not taking: Reported on 06/06/2022 02/14/22   Criselda Peaches, DPM  DM-APAP-CPM (CORICIDIN HBP PO) Take 2 tablets by mouth every 4 (four) hours.    [provider]    Current Outpatient Medications  Medication Sig Dispense Refill  Multiple Vitamin (MULTIVITAMIN) capsule Take 1 capsule by mouth daily.     simvastatin (ZOCOR) 40 MG tablet TAKE 1 TABLET BY MOUTH EVERY DAY 90 tablet 0   acetaminophen (TYLENOL) 325 MG tablet Take 650 mg by mouth every 6 (six) hours as needed.     albuterol (VENTOLIN HFA) 108 (90 Base) MCG/ACT inhaler Inhale 2 puffs into the lungs every 6 (six) hours as needed for wheezing or shortness of breath. Last refill, if symptoms persist will need evaluation by Pulmonology for COPD (Patient not taking: Reported on 01/09/2022) 8 g 0   calcium carbonate  (OS-CAL) 1250 (500 Ca) MG chewable tablet Chew 1 tablet by mouth daily. (Patient not taking: Reported on 01/09/2022)     cetirizine (ZYRTEC) 10 MG tablet Take 10 mg by mouth daily. (Patient not taking: Reported on 01/09/2022)     diclofenac (VOLTAREN) 75 MG EC tablet TAKE 1 TABLET BY MOUTH TWICE A DAY (Patient not taking: Reported on 06/06/2022) 30 tablet 0   DM-APAP-CPM (CORICIDIN HBP PO) Take 2 tablets by mouth every 4 (four) hours.     Current Facility-Administered Medications  Medication Dose Route Frequency Provider Last Rate Last Admin   0.9 %  sodium chloride infusion  500 mL Intravenous Once Gatha Mayer, MD        Allergies as of 07/02/2022 - Review Complete 07/02/2022  Allergen Reaction Noted   Aspirin Nausea Only and Other (See Comments) 05/12/2011   Ibuprofen  12/10/2018    Family History  Problem Relation Age of Onset   Hypertension Mother    Hepatitis Mother        Hepatitis C   Asthma Sister    Breast cancer Sister 2   Diabetes Maternal Grandmother        leg amputation   Heart disease Maternal Grandmother    Hypertension Maternal Grandmother    Stroke Maternal Aunt 50       2   Cirrhosis Maternal Aunt    Cancer Paternal Aunt        lung cancer   Breast cancer Cousin        first cousins once removed x2   Breast cancer Other        mat great aunts x3   Colon cancer Neg Hx    Rectal cancer Neg Hx    Stomach cancer Neg Hx    Colon polyps Neg Hx     Social History   Socioeconomic History   Marital status: Married    Spouse name: Not on file   Number of children: Not on file   Years of education: Not on file   Highest education level: Not on file  Occupational History   Not on file  Tobacco Use   Smoking status: Former    Packs/day: 0.25    Years: 36.00    Total pack years: 9.00    Types: Cigarettes    Quit date: 08/2018    Years since quitting: 3.8   Smokeless tobacco: Never  Vaping Use   Vaping Use: Never used  Substance and Sexual Activity    Alcohol use: Yes    Comment: 2x per mo   Drug use: No   Sexual activity: Yes    Birth control/protection: Surgical  Other Topics Concern   Not on file  Social History Narrative   Married, has 3 grandchildren, has 2 daughters (33 and 14), 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    Social Determinants of Health   Financial Resource Strain: Not on file  Food Insecurity: Not on file  Transportation Needs: Not on file  Physical Activity: Not on file  Stress: Not on file  Social Connections: Not on file  Intimate Partner Violence: Not on file    Review of Systems:  All other review of systems negative except as mentioned in the HPI.  Physical Exam: Vital signs BP 133/68   Pulse 65   Temp 98.7 F (37.1 C)   Ht 5' 6"$  (1.676 m)   Wt 205 lb (93 kg)   LMP 10/08/2011   SpO2 98%   BMI 33.09 kg/m   General:   Alert,  Well-developed, well-nourished, pleasant and cooperative in NAD Lungs:  Clear throughout to auscultation.   Heart:  Regular rate and rhythm; no murmurs, clicks, rubs,  or gallops. Abdomen:  Soft, nontender and nondistended. Normal bowel sounds.   Neuro/Psych:  Alert and cooperative. Normal mood and affect. A and O x 3  Neck w/ FROM and no masses, tenderness  @Zoe Goonan$  Simonne Maffucci, MD, [facility] [phone] (pager) 07/02/2022 1:19 PM@

## 2022-07-02 NOTE — Progress Notes (Signed)
Pt's states no medical or surgical changes since previsit or office visit. 

## 2022-07-02 NOTE — Progress Notes (Signed)
PIV was difficult with 3 providers attempting 5 times, with a successful PIV placed in the right hand, 24g. During one PIV attempt, in right [facility], by first attempting provider, patient reported a sharp pain down her right arm, and then a more dull pain/ache on the left side of her neck. RN applied hot packs, which the patient reported as helpful. Patient reported the discomfort as like she had slept on her neck wrong and was having difficulty turning towards her left side. Patient did not want to cancel case, and no visible issues of her neck were noted. MD made aware. Before induction of MAC anesthetic, CRNA placed hot pack under patient's left neck (with patient repositioning hot pack) and patient was comfortable prior to induction.

## 2022-07-02 NOTE — Progress Notes (Signed)
Uneventful anesthetic. Report to pacu rn. Vss. Care resumed by rn.

## 2022-07-02 NOTE — Patient Instructions (Addendum)
I found and removed 3 tiny polyps that look benign.  I will let you know pathology results and when to have another routine colonoscopy by mail and/or My Chart.  You also have a condition called diverticulosis - common and not usually a problem. Please read the handout provided.  All else ok.  I hope your neck feels better soon - would use heat, acetaminophen  as needed to help this.Would do range of motion in a warm-hot shower tomorrow if it is still bothering you.  I appreciate the opportunity to care for you. Gatha Mayer, MD, [facility]  Handouts provided on polyps and diverticulosis.   Resume previous diet. Continue present medications.   Repeat colonoscopy is recommended. The colonoscopy date will be determined after pathology results from today's exam become available for review.   YOU HAD AN ENDOSCOPIC PROCEDURE TODAY AT [facility]:   Refer to the procedure report that was given to you for any specific questions about what was found during the examination.  If the procedure report does not answer your questions, please call your gastroenterologist to clarify.  If you requested that your care partner not be given the details of your procedure findings, then the procedure report has been included in a sealed envelope for you to review at your convenience later.  YOU SHOULD EXPECT: Some feelings of bloating in the abdomen. Passage of more gas than usual.  Walking can help get rid of the air that was put into your GI tract during the procedure and reduce the bloating. If you had a lower endoscopy (such as a colonoscopy or flexible sigmoidoscopy) you may notice spotting of blood in your stool or on the toilet paper. If you underwent a bowel prep for your procedure, you may not have a normal bowel movement for a few days.  Please Note:  You might notice some irritation and congestion in your nose or some drainage.  This is from the oxygen used during your procedure.  There is  no need for concern and it should clear up in a day or so.  SYMPTOMS TO REPORT IMMEDIATELY:  Following lower endoscopy (colonoscopy or flexible sigmoidoscopy):  Excessive amounts of blood in the stool  Significant tenderness or worsening of abdominal pains  Swelling of the abdomen that is new, acute  Fever of 100F or higher  For urgent or emergent issues, a gastroenterologist can be reached at any hour by calling [phone]. Do not use MyChart messaging for urgent concerns.    DIET:  We do recommend a small meal at first, but then you may proceed to your regular diet.  Drink plenty of fluids but you should avoid alcoholic beverages for 24 hours.  ACTIVITY:  You should plan to take it easy for the rest of today and you should NOT DRIVE or use heavy machinery until tomorrow (because of the sedation medicines used during the test).    FOLLOW UP: Our staff will call the number listed on your records the next business day following your procedure.  We will call around 7:15- 8:00 am to check on you and address any questions or concerns that you may have regarding the information given to you following your procedure. If we do not reach you, we will leave a message.     If any biopsies were taken you will be contacted by phone or by letter within the next 1-3 weeks.  Please call us at [phone] if you have not heard  about the biopsies in 3 weeks.    SIGNATURES/CONFIDENTIALITY: You and/or your care partner have signed paperwork which will be entered into your electronic medical record.  These signatures attest to the fact that that the information above on your After Visit Summary has been reviewed and is understood.  Full responsibility of the confidentiality of this discharge information lies with you and/or your care-partner.

## 2022-07-03 ENCOUNTER — Telehealth: Payer: Self-pay

## 2022-07-03 DIAGNOSIS — Z789 Other specified health status: Secondary | ICD-10-CM | POA: Insufficient documentation

## 2022-07-03 NOTE — Telephone Encounter (Signed)
  Follow up Call-     07/02/2022   12:49 PM  Call back number  Post procedure Call Back phone  # [phone]  Permission to leave phone message Yes     Patient questions:  Do you have a fever, pain , or abdominal swelling? No. Pain Score  0 *  Have you tolerated food without any problems? Yes.    Have you been able to return to your normal activities? Yes.    Do you have any questions about your discharge instructions: Diet   No. Medications  No. Follow up visit  No.  Do you have questions or concerns about your Care? No.  Actions: * If pain score is 4 or above: No action needed, pain <4.   Patient had some Left sided neck pain after PIV insertion before procedure. Patient states that her neck is still sore but that the pain has improved and she has better range of motion now in her neck. Advised patient to call if the pain does not go away or if it gets worse.

## 2022-07-11 ENCOUNTER — Ambulatory Visit
Admission: RE | Admit: 2022-07-11 | Discharge: 2022-07-11 | Disposition: A | Discharge status: Home / Self Care | Payer: 59 | Source: Ambulatory Visit | Attending: Surgery | Admitting: Surgery

## 2022-07-11 DIAGNOSIS — Z1231 Encounter for screening mammogram for malignant neoplasm of breast: Secondary | ICD-10-CM

## 2022-07-15 ENCOUNTER — Ambulatory Visit: Payer: 59 | Admitting: Nurse Practitioner

## 2022-07-15 ENCOUNTER — Encounter: Payer: Self-pay | Admitting: Nurse Practitioner

## 2022-07-15 VITALS — BP 130/88 | HR 66 | Temp 98.3°F | Wt 208.6 lb

## 2022-07-15 DIAGNOSIS — U071 COVID-19: Secondary | ICD-10-CM | POA: Diagnosis not present

## 2022-07-15 HISTORY — DX: COVID-19: U07.1

## 2022-07-15 LAB — POC COVID19 BINAXNOW: SARS Coronavirus 2 Ag: POSITIVE — AB

## 2022-07-15 MED ORDER — PSEUDOEPH-BROMPHEN-DM 30-2-10 MG/5ML PO SYRP: 5.0000 mL | ORAL_SOLUTION | Freq: Four times a day (QID) | ORAL | 0 refills | Status: DC | PRN

## 2022-07-15 NOTE — Progress Notes (Unsigned)
Orma Render, DNP, AGNP-c [place] [address] [place], [place] [address] [phone]  Subjective:   [name] is a 57 y.o. female presents to day for evaluation of: Zuella presents today with symptoms consistent with a positive COVID-19 test result. She reports feeling generally unwell and experiencing pain in her upper back. She suspects the back pain may be due to body aches associated with COVID-19 or from coughing. Her symptoms commenced last Thursday, and despite an initial negative COVID-19 test, a subsequent test in the office today confirmed her positive status. Additionally, she describes episodes of sweating and sensations of heat, which may suggest the presence of fever.  She is the owner of a daycare and expresses concern regarding the potential source of her infection. To manage her symptoms, Willean has been using cough medicine, though she notes that it provides only minimal relief. She has a medical history notable for stomach upset when taking ibuprofen and is currently on a statin regimen. She confirms that she has no history of kidney or liver issues. Despite her illness, she has continued to work.  She denies shortness of breath, dizziness, or difficulty breathing.  PMH, Medications, and Allergies reviewed and updated in chart as appropriate.   ROS negative except for what is listed in HPI. Objective:  BP 130/88   Pulse 66   Temp 98.3 F (36.8 C)   Wt 208 lb 9.6 oz (94.6 kg)   LMP 10/08/2011   SpO2 96%   BMI 33.67 kg/m  Physical Exam Vitals and nursing note reviewed.  Constitutional:      Appearance: She is ill-appearing.  HENT:     Head: Normocephalic.  Eyes:     Pupils: Pupils are equal, round, and reactive to light.  Cardiovascular:     Rate and Rhythm: Regular rhythm. Tachycardia present.     Pulses: Normal pulses.     Heart sounds: Normal heart sounds.  Pulmonary:     Effort: Pulmonary effort is normal.     Breath  sounds: Normal breath sounds. No wheezing or rhonchi.  Abdominal:     General: There is no distension.     Tenderness: There is no abdominal tenderness.  Musculoskeletal:        General: Normal range of motion.  Skin:    General: Skin is warm and dry.     Capillary Refill: Capillary refill takes less than 2 seconds.  Neurological:     General: No focal deficit present.     Mental Status: She is alert and oriented to person, place, and time.     Motor: Weakness present.  Psychiatric:        Mood and Affect: Mood normal.           Assessment & Plan:   Problem List Items Addressed This Visit     COVID-19 - Primary    The patient has tested positive for COVID-19 in the office today and is experiencing body aches, sweating, feeling hot, and cough. Symptoms commenced last week following an initial negative COVID test. Currently, the patient does not have a fever. Lung auscultation reveals clear sounds. There is no reported history of kidney or liver issues. We discussed the option of Paxlovid for treatment today, however, no kidney function available. She declines having labs drawn or prescription for Paxlovid. Plan: - For symptomatic relief of aches and pains and fevers, ibuprofen and tylenol may be used as needed, alternating every 6-8 hours. Use caution due to the patient's reported  stomach upset. - Cough medicine sent, instructing the patient to take it up to four times daily for cough and congestion relief. - Strongly recommend rest and adequate hydration, as well as quarantine until her symptoms have resolved per the newest CDC guidelines.        Relevant Medications   brompheniramine-pseudoephedrine-DM 30-2-10 MG/5ML syrup   Other Relevant Orders   POC COVID-19 (Completed)      Orma Render, DNP, AGNP-c 07/17/2022  7:18 PM    History, Medications, Surgery, SDOH, and Family History reviewed and updated as appropriate.

## 2022-07-15 NOTE — Patient Instructions (Addendum)
I recommend staying well hydrated- be sure you are drinking at least 64 ounces of liquid a day, even if you don't feel like eating.   Rest when you need to rest. This is vital to allow your body to heal.   I have sent in cough medication for you to use up to 4 times a day. This will help with cough and congestion.   I want you to take Tylenol '1000mg'$  every 6-8 hours to help with pain/aches/fevers. If you want to add ibuprofen, this is better to help with the fever and pain and can be alternated with the tylenol.

## 2022-07-16 ENCOUNTER — Encounter: Payer: Self-pay | Admitting: Internal Medicine

## 2022-07-16 DIAGNOSIS — Z789 Other specified health status: Secondary | ICD-10-CM

## 2022-07-16 DIAGNOSIS — Z86010 Personal history of colonic polyps: Secondary | ICD-10-CM

## 2022-07-17 NOTE — Assessment & Plan Note (Signed)
The patient has tested positive for COVID-19 in the office today and is experiencing body aches, sweating, feeling hot, and cough. Symptoms commenced last week following an initial negative COVID test. Currently, the patient does not have a fever. Lung auscultation reveals clear sounds. There is no reported history of kidney or liver issues. We discussed the option of Paxlovid for treatment today, however, no kidney function available. She declines having labs drawn or prescription for Paxlovid. Plan: - For symptomatic relief of aches and pains and fevers, ibuprofen and tylenol may be used as needed, alternating every 6-8 hours. Use caution due to the patient's reported stomach upset. - Cough medicine sent, instructing the patient to take it up to four times daily for cough and congestion relief. - Strongly recommend rest and adequate hydration, as well as quarantine until her symptoms have resolved per the newest CDC guidelines.

## 2022-08-28 ENCOUNTER — Encounter: Payer: Self-pay | Admitting: Nurse Practitioner

## 2022-08-28 ENCOUNTER — Ambulatory Visit: Payer: 59 | Admitting: Nurse Practitioner

## 2022-08-28 VITALS — BP 130/70 | HR 60 | Wt 210.2 lb

## 2022-08-28 DIAGNOSIS — M545 Low back pain, unspecified: Secondary | ICD-10-CM | POA: Diagnosis not present

## 2022-08-28 DIAGNOSIS — N898 Other specified noninflammatory disorders of vagina: Secondary | ICD-10-CM

## 2022-08-28 DIAGNOSIS — M25431 Effusion, right wrist: Secondary | ICD-10-CM

## 2022-08-28 DIAGNOSIS — G8929 Other chronic pain: Secondary | ICD-10-CM

## 2022-08-28 DIAGNOSIS — K047 Periapical abscess without sinus: Secondary | ICD-10-CM

## 2022-08-28 DIAGNOSIS — M25432 Effusion, left wrist: Secondary | ICD-10-CM | POA: Insufficient documentation

## 2022-08-28 HISTORY — DX: Other specified noninflammatory disorders of vagina: N89.8

## 2022-08-28 MED ORDER — FLUCONAZOLE 150 MG PO TABS: 150.0000 mg | ORAL_TABLET | Freq: Once | ORAL | 2 refills | Status: AC

## 2022-08-28 MED ORDER — METRONIDAZOLE 500 MG PO TABS: 500.0000 mg | ORAL_TABLET | Freq: Two times a day (BID) | ORAL | 0 refills | Status: DC

## 2022-08-28 NOTE — Patient Instructions (Signed)
Take 1 diflucan today and the 2nd when you complete the last day of the metronidazole treatment. This will help rid any yeast.   Start the metronidazole today and take 2 times a day for 7 days for bacterial vaginosis.   If your symptoms do not improve, please let me know.

## 2022-08-28 NOTE — Progress Notes (Signed)
Tollie Eth, DNP, AGNP-c [facility] [address] [place], [place] [address] [phone]  Subjective:   [place] Scheiderer is a 57 y.o. female presents to day for evaluation of:  Lorriann presents today with chief complaints of vaginal itching and discharge for approximately one and a half weeks. She reports that the discharge initially started thin but has become thicker over time. The patient also notes an odor associated with the discharge, describing it as yeast-like. She denies any burning with urination. She is not currently sexually active and denies any risk of STI. She declines vaginal examination today.   Additionally, the patient reports experiencing intermittent pain on one side of her lower back. The nature of the pain is described as both sharp and dull, and it does not move, remaining localized in the same area.  The patient also mentions a recent dental infection that required two rounds of antibiotics, a root canal, and tooth extraction. She has had veneers placed on her front teeth and recently had a temporary tooth replaced with a permanent one. The patient notes that her bite is currently off due to the dental work and plans to follow up with her dentist.  PMH, Medications, and Allergies reviewed and updated in chart as appropriate.   ROS negative except for what is listed in HPI. Objective:  BP 130/70   Pulse 60   Wt 210 lb 3.2 oz (95.3 kg)   LMP 10/08/2011   BMI 33.93 kg/m  Physical Exam Vitals and nursing note reviewed.  Constitutional:      Appearance: Normal appearance.  HENT:     Head: Normocephalic.  Eyes:     Pupils: Pupils are equal, round, and reactive to light.  Cardiovascular:     Rate and Rhythm: Normal rate and regular rhythm.     Pulses: Normal pulses.     Heart sounds: Normal heart sounds.  Pulmonary:     Effort: Pulmonary effort is normal.     Breath sounds: Normal breath sounds.  Abdominal:     General: Bowel sounds  are normal. There is no distension.     Palpations: Abdomen is soft.     Tenderness: There is no abdominal tenderness. There is no right CVA tenderness, left CVA tenderness, guarding or rebound.  Genitourinary:    Comments: Declined exam Skin:    General: Skin is warm and dry.     Capillary Refill: Capillary refill takes less than 2 seconds.  Neurological:     General: No focal deficit present.     Mental Status: She is alert.  Psychiatric:        Mood and Affect: Mood normal.        Assessment & Plan:   Problem List Items Addressed This Visit     Chronic right-sided low back pain without sciatica    Patient reports occasional sharp or dull pain in the lower back, localized to one side. Plan: - Monitor symptoms and encourage patient to report any worsening or changes in pain. - Recommend over-the-counter pain relief (e.g., ibuprofen or acetaminophen) as needed for pain management. - Encourage patient to maintain proper posture and consider incorporating gentle stretching or exercise to help alleviate discomfort.      Swelling of both wrists    Patient reports no pain or swelling in the wrist, but notes a visible difference in appearance. Plan: - Reassure patient that the appearance is likely due to normal anatomical variation and tendon structure. - Monitor for any changes in  appearance, pain, or function, and encourage patient to report any concerns.      Dental infection    Patient reports resolution of dental infection after multiple rounds of antibiotics and dental interventions. Plan: - No further antibiotics needed at this time. - Encourage patient to follow up with dentist for bite adjustment and any further dental concerns.      Relevant Medications   metroNIDAZOLE (FLAGYL) 500 MG tablet   Vaginal pruritus - Primary    Vaginal discharge and itching present following antibiotic completion with both thick and thin discharge. She declines a vaginal exam today. She  has had BV and Yeast in the past and reports her symptoms are consistent.  Plan: - Start treatment with diflucan today. Take one tablet today and the second tablet on the last day of metronidazole dosing.  - Start metronidazole twice a day today and continue for 7 days. -  If your symptoms do not improve or worsen, please let me know. We will plan to do a vaginal swab to ensure we are treating appropriately.       Relevant Medications   metroNIDAZOLE (FLAGYL) 500 MG tablet   Other Visit Diagnoses     Vaginal discharge       Relevant Medications   metroNIDAZOLE (FLAGYL) 500 MG tablet         Tollie Eth, DNP, AGNP-c 09/03/2022  7:41 AM    History, Medications, Surgery, SDOH, and Family History reviewed and updated as appropriate.

## 2022-08-28 NOTE — Assessment & Plan Note (Signed)
Vaginal discharge and itching present following antibiotic completion with both thick and thin discharge. She declines a vaginal exam today. She has had BV and Yeast in the past and reports her symptoms are consistent.  Plan: - Start treatment with diflucan today. Take one tablet today and the second tablet on the last day of metronidazole dosing.  - Start metronidazole twice a day today and continue for 7 days. -  If your symptoms do not improve or worsen, please let me know. We will plan to do a vaginal swab to ensure we are treating appropriately.

## 2022-09-03 DIAGNOSIS — K047 Periapical abscess without sinus: Secondary | ICD-10-CM

## 2022-09-03 HISTORY — DX: Periapical abscess without sinus: K04.7

## 2022-09-03 NOTE — Assessment & Plan Note (Signed)
Patient reports no pain or swelling in the wrist, but notes a visible difference in appearance. Plan: - Reassure patient that the appearance is likely due to normal anatomical variation and tendon structure. - Monitor for any changes in appearance, pain, or function, and encourage patient to report any concerns.

## 2022-09-03 NOTE — Assessment & Plan Note (Signed)
Patient reports resolution of dental infection after multiple rounds of antibiotics and dental interventions. Plan: - No further antibiotics needed at this time. - Encourage patient to follow up with dentist for bite adjustment and any further dental concerns.

## 2022-09-03 NOTE — Assessment & Plan Note (Signed)
Patient reports occasional sharp or dull pain in the lower back, localized to one side. Plan: - Monitor symptoms and encourage patient to report any worsening or changes in pain. - Recommend over-the-counter pain relief (e.g., ibuprofen or acetaminophen) as needed for pain management. - Encourage patient to maintain proper posture and consider incorporating gentle stretching or exercise to help alleviate discomfort.

## 2022-09-26 ENCOUNTER — Ambulatory Visit (INDEPENDENT_AMBULATORY_CARE_PROVIDER_SITE_OTHER): Payer: 59 | Admitting: Nurse Practitioner

## 2022-09-26 ENCOUNTER — Encounter: Payer: Self-pay | Admitting: Nurse Practitioner

## 2022-09-26 VITALS — BP 128/82 | HR 65 | Ht 66.5 in | Wt 212.0 lb

## 2022-09-26 DIAGNOSIS — Z862 Personal history of diseases of the blood and blood-forming organs and certain disorders involving the immune mechanism: Secondary | ICD-10-CM

## 2022-09-26 DIAGNOSIS — R7303 Prediabetes: Secondary | ICD-10-CM | POA: Diagnosis not present

## 2022-09-26 DIAGNOSIS — E782 Mixed hyperlipidemia: Secondary | ICD-10-CM

## 2022-09-26 DIAGNOSIS — M545 Low back pain, unspecified: Secondary | ICD-10-CM

## 2022-09-26 DIAGNOSIS — G8929 Other chronic pain: Secondary | ICD-10-CM

## 2022-09-26 DIAGNOSIS — B9689 Other specified bacterial agents as the cause of diseases classified elsewhere: Secondary | ICD-10-CM

## 2022-09-26 DIAGNOSIS — N760 Acute vaginitis: Secondary | ICD-10-CM

## 2022-09-26 DIAGNOSIS — Z0000 Encounter for general adult medical examination without abnormal findings: Secondary | ICD-10-CM | POA: Diagnosis not present

## 2022-09-26 MED ORDER — BORIC ACID VAGINAL 600 MG VA SUPP: VAGINAL | 6 refills | Status: DC

## 2022-09-26 NOTE — Patient Instructions (Signed)
I have sent the order for the x-ray to [address] at [facility]. You can walk in to have this done.  I will let you know what your labs show.    For all adult patients, I recommend A well balanced diet low in saturated fats, cholesterol, and moderation in carbohydrates.   This can be as simple as monitoring portion sizes and cutting back on sugary beverages such as soda and juice to start with.    Daily water consumption of at least 64 ounces.  Physical activity at least 180 minutes per week, if just starting out.   This can be as simple as taking the stairs instead of the elevator and walking 2-3 laps around the office  purposefully every day.   STD protection, partner selection, and regular testing if high risk.  Limited consumption of alcoholic beverages if alcohol is consumed.  For women, I recommend no more than 7 alcoholic beverages per week, spread out throughout the week.  Avoid "binge" drinking or consuming large quantities of alcohol in one setting.   Please let me know if you feel you may need help with reduction or quitting alcohol consumption.   Avoidance of nicotine, if used.  Please let me know if you feel you may need help with reduction or quitting nicotine use.   Daily mental health attention.  This can be in the form of 5 minute daily meditation, prayer, journaling, yoga, reflection, etc.   Purposeful attention to your emotions and mental state can significantly improve your overall wellbeing  and  Health.  Please know that I am here to help you with all of your health care goals and am happy to work with you to find a solution that works best for you.  The greatest advice I have received with any changes in life are to take it one step at a time, that even means if all you can focus on is the next 60 seconds, then do that and celebrate your victories.  With any changes in life, you will have set backs, and that is OK. The important thing to remember is,  if you have a set back, it is not a failure, it is an opportunity to try again!  Health Maintenance Recommendations Screening Testing Mammogram Every 1 -2 years based on history and risk factors Starting at age 59 Pap Smear Ages 21-39 every 3 years Ages 59-65 every 5 years with HPV testing More frequent testing may be required based on results and history Colon Cancer Screening Every 1-10 years based on test performed, risk factors, and history Starting at age 45 Bone Density Screening Every 2-10 years based on history Starting at age 59 for women Recommendations for men differ based on medication usage, history, and risk factors AAA Screening One time ultrasound Men 46-59 years old who have every smoked Lung Cancer Screening Low Dose Lung CT every 12 months Age 59-80 years with a 30 pack-year smoking history who still smoke or who have quit within the last 15 years  Screening Labs Routine  Labs: Complete Blood Count (CBC), Complete Metabolic Panel (CMP), Cholesterol (Lipid Panel) Every 6-12 months based on history and medications May be recommended more frequently based on current conditions or previous results Hemoglobin A1c Lab Every 3-12 months based on history and previous results Starting at age 59 or earlier with diagnosis of diabetes, high cholesterol, BMI >26, and/or risk factors Frequent monitoring for patients with diabetes to ensure blood sugar control Thyroid Panel (  TSH w/ T3 & T4) Every 6 months based on history, symptoms, and risk factors May be repeated more often if on medication HIV One time testing for all patients 59 and older May be repeated more frequently for patients with increased risk factors or exposure Hepatitis C One time testing for all patients 59 and older May be repeated more frequently for patients with increased risk factors or exposure Gonorrhea, Chlamydia Every 12 months for all sexually active persons 13-24 years Additional monitoring  may be recommended for those who are considered high risk or who have symptoms PSA Men 10-30 years old with risk factors Additional screening may be recommended from age 59-69 based on risk factors, symptoms, and history  Vaccine Recommendations Tetanus Booster All adults every 10 years Flu Vaccine All patients 6 months and older every year COVID Vaccine All patients 12 years and older Initial dosing with booster May recommend additional booster based on age and health history HPV Vaccine 2 doses all patients age 59-26 Dosing may be considered for patients over 26 Shingles Vaccine (Shingrix) 2 doses all adults 59 years and older Pneumonia (Pneumovax 23) All adults 65 years and older May recommend earlier dosing based on health history Pneumonia (Prevnar 69) All adults 65 years and older Dosed 1 year after Pneumovax 23  Additional Screening, Testing, and Vaccinations may be recommended on an individualized basis based on family history, health history, risk factors, and/or exposure.

## 2022-09-26 NOTE — Progress Notes (Signed)
Shawna Clamp, DNP, AGNP-c [facility] [address] [place], [place] [address] Main Office [phone]  BP 128/82   Pulse 65   Ht 5' 6.5" (1.689 m)   Wt 212 lb (96.2 kg)   LMP 10/08/2011   BMI 33.71 kg/m    Subjective:    Patient ID: [name], female    DOB: 04-12-1966, 57 y.o.   MRN: [id number]  HPI: [name] is a 57 y.o. female presenting on 09/26/2022 for comprehensive medical examination.   Current medical concerns include: The patient presents today with chief complaints of ongoing vaginal odor, described as fishy, despite previous treatment with metronidazole.  The patient explains that the vaginal odor has persisted even after antibiotic treatment for a previous condition. She denies having a severe discharge but is bothered by the lingering odor. The patient has a history of using antibiotics which led to the initial vaginal discharge and subsequent odor issue.   She also mentions an intermittent ache on her right side and lower back, which she describes as annoying. She notes that the pain is not constant but comes and goes, typically lasting a day or two before subsiding. She describes the location as not quite the thigh but localized more towards the lower back and side. She denies any urinary symptoms like pain or burning during urination but mentions that the pain does not seem to improve with ibuprofen.    Pertinent items are noted in HPI.  IMMUNIZATIONS:   Flu: Flu vaccine postponed until flu season Prevnar 13: Prevnar 13 N/A for this patient Prevnar 20: Prevnar 20 N/A for this patient Pneumovax 23: Pneumovax 23 N/A for this patient COVID: COVID declined, patient will complete at a later date HPV: HPV N/A for this patient Tetanus: Tetanus felt to have been completed in the last 10 years- will review records COVID: COVID completed, documentation in chart   HEALTH MAINTENANCE: Pap Smear HM Status: is up to  date Mammogram HM Status: is up to date Colon Cancer Screening HM Status: is up to date Bone Density HM Status: is not applicable for this patient STI Testing HM Status: was declined  Lung CT HM Status: is not applicable for this patient  She reports regular vision exams q1-5y: Yes  She reports regular dental exams q 75m:  Yes  The patient eats a regular, healthy diet. She endorses exercise and/or activity of: Moderate 30 min 3-4x/wk Mode: routine   Most Recent Depression Screen:     09/26/2022    9:37 AM 01/09/2022   11:13 AM 09/04/2020    3:19 PM 05/27/2019   10:33 AM 10/30/2017   10:30 AM  Depression screen PHQ 2/9  Decreased Interest 0 0 0 0 0  Down, Depressed, Hopeless 0 0 0 0 0  PHQ - 2 Score 0 0 0 0 0   Most Recent Anxiety Screen:      No data to display         Most Recent Fall Screen:    09/26/2022    9:37 AM 01/09/2022   11:13 AM 05/29/2021    9:28 AM 09/04/2020    3:19 PM 05/27/2019   10:33 AM  Fall Risk   Falls in the past year? 0 0 0 0 0  Number falls in past yr: 0 0 0 0 0  Injury with Fall? 0 0 0 0 0  Risk for fall due to : No Fall Risks No Fall Risks No Fall Risks No Fall Risks   Follow  up Falls evaluation completed Falls evaluation completed Falls evaluation completed Falls evaluation completed     Past medical history, surgical history, medications, allergies, family history and social history reviewed with patient today and changes made to appropriate areas of the chart.  Past Medical History:  Past Medical History:  Diagnosis Date   Anemia    COVID-19 07/15/2022   Encounter for routine gynecological examination    Dr. Normand Sloop   Family history of breast cancer    Family history of lung cancer    History of blood transfusion 2009   d/t Hg ~4   Hx of adenomatous colonic polyps 04/21/2017   Hyperlipidemia 2014   Prediabetes    SVD (spontaneous vaginal delivery)    x 2   Ulcer as a child   gastric ulcer, unable to take aspirin   Uterine fibroid     Medications:  Current Outpatient Medications on File Prior to Visit  Medication Sig   acetaminophen (TYLENOL) 325 MG tablet Take 650 mg by mouth every 6 (six) hours as needed.   albuterol (VENTOLIN HFA) 108 (90 Base) MCG/ACT inhaler Inhale 2 puffs into the lungs every 6 (six) hours as needed for wheezing or shortness of breath. Last refill, if symptoms persist will need evaluation by Pulmonology for COPD   calcium carbonate (OS-CAL) 1250 (500 Ca) MG chewable tablet Chew 1 tablet by mouth daily.   Multiple Vitamin (MULTIVITAMIN) capsule Take 1 capsule by mouth daily.   No current facility-administered medications on file prior to visit.   Surgical History:  Past Surgical History:  Procedure Laterality Date   BREAST BIOPSY Left 04/2019   ADH w/ calcs   BREAST BIOPSY Right 04/2019   fibrocystic change w/ calcs   BREAST EXCISIONAL BIOPSY Left 06/07/2019   atypical ductal hyperplasia (cm)    BREAST LUMPECTOMY WITH RADIOACTIVE SEED LOCALIZATION Left 06/07/2019   Procedure: LEFT BREAST LUMPECTOMY WITH RADIOACTIVE SEED LOCALIZATION;  Surgeon: Harriette Bouillon, MD;  Location: [facility];  Service: General;  Laterality: Left;   BREAST LUMPECTOMY WITH RADIOACTIVE SEED LOCALIZATION Left 10/02/2021   Procedure: LEFT BREAST LUMPECTOMY WITH RADIOACTIVE SEED LOCALIZATION;  Surgeon: Harriette Bouillon, MD;  Location: [facility];  Service: General;  Laterality: Left;   DILATATION & CURRETTAGE/HYSTEROSCOPY WITH RESECTOCOPE N/A 10/31/2014   Procedure: DILATATION & CURETTAGE/HYSTEROSCOPY WITH RESECTOCOPE, RESECTION OF POLYP WITH MYOSURE ;  Surgeon: Jaymes Graff, MD;  Location: [facility];  Service: Gynecology;  Laterality: N/A;   ECTOPIC PREGNANCY SURGERY     MYOMECTOMY     TONSILLECTOMY  as a child   TUBAL LIGATION     WISDOM TOOTH EXTRACTION Bilateral    Allergies:  Allergies  Allergen Reactions   Aspirin Nausea Only and Other (See Comments)    Stomach cramping   Ibuprofen      Pt stated, "Upsets stomach while I am on my cholesterol medicine"   Family History:  Family History  Problem Relation Age of Onset   Hypertension Mother    Hepatitis Mother        Hepatitis C   Asthma Sister    Breast cancer Sister 46   Diabetes Maternal Grandmother        leg amputation   Heart disease Maternal Grandmother    Hypertension Maternal Grandmother    Stroke Maternal Aunt 50       2   Cirrhosis Maternal Aunt    Cancer Paternal Aunt        lung cancer   Breast  cancer Cousin        first cousins once removed x2   Breast cancer Other        mat great aunts x3   Colon cancer Neg Hx    Rectal cancer Neg Hx    Stomach cancer Neg Hx    Colon polyps Neg Hx        Objective:    BP 128/82   Pulse 65   Ht 5' 6.5" (1.689 m)   Wt 212 lb (96.2 kg)   LMP 10/08/2011   BMI 33.71 kg/m   Wt Readings from Last 3 Encounters:  09/26/22 212 lb (96.2 kg)  08/28/22 210 lb 3.2 oz (95.3 kg)  07/15/22 208 lb 9.6 oz (94.6 kg)    Physical Exam Vitals and nursing note reviewed.  Constitutional:      General: She is not in acute distress.    Appearance: Normal appearance.  HENT:     Head: Normocephalic and atraumatic.     Right Ear: Hearing, tympanic membrane, ear canal and external ear normal.     Left Ear: Hearing, tympanic membrane, ear canal and external ear normal.     Nose: Nose normal.     Right Sinus: No maxillary sinus tenderness or frontal sinus tenderness.     Left Sinus: No maxillary sinus tenderness or frontal sinus tenderness.     Mouth/Throat:     Lips: Pink.     Mouth: Mucous membranes are moist.     Pharynx: Oropharynx is clear.  Eyes:     General: Lids are normal. Vision grossly intact.     Extraocular Movements: Extraocular movements intact.     Conjunctiva/sclera: Conjunctivae normal.     Pupils: Pupils are equal, round, and reactive to light.     Funduscopic exam:    Right eye: Red reflex present.        Left eye: Red reflex present.    Visual  Fields: Right eye visual fields normal and left eye visual fields normal.  Neck:     Thyroid: No thyromegaly.     Vascular: No carotid bruit.  Cardiovascular:     Rate and Rhythm: Normal rate and regular rhythm.     Chest Wall: PMI is not displaced.     Pulses: Normal pulses.          Dorsalis pedis pulses are 2+ on the right side and 2+ on the left side.       Posterior tibial pulses are 2+ on the right side and 2+ on the left side.     Heart sounds: Normal heart sounds. No murmur heard. Pulmonary:     Effort: Pulmonary effort is normal. No respiratory distress.     Breath sounds: Normal breath sounds.  Abdominal:     General: Abdomen is flat. Bowel sounds are normal. There is no distension.     Palpations: Abdomen is soft. There is no hepatomegaly, splenomegaly or mass.     Tenderness: There is no abdominal tenderness. There is no right CVA tenderness, left CVA tenderness, guarding or rebound.  Musculoskeletal:        General: Normal range of motion.     Cervical back: Full passive range of motion without pain, normal range of motion and neck supple. No tenderness.     Right lower leg: No edema.     Left lower leg: No edema.  Feet:     Left foot:     Toenail Condition: Left toenails are normal.  Lymphadenopathy:     Cervical: No cervical adenopathy.     Upper Body:     Right upper body: No supraclavicular adenopathy.     Left upper body: No supraclavicular adenopathy.  Skin:    General: Skin is warm and dry.     Capillary Refill: Capillary refill takes less than 2 seconds.     Nails: There is no clubbing.  Neurological:     General: No focal deficit present.     Mental Status: She is alert and oriented to person, place, and time.     GCS: GCS eye subscore is 4. GCS verbal subscore is 5. GCS motor subscore is 6.     Sensory: Sensation is intact.     Motor: Motor function is intact.     Coordination: Coordination is intact.     Gait: Gait is intact.     Deep Tendon  Reflexes: Reflexes are normal and symmetric.  Psychiatric:        Attention and Perception: Attention normal.        Mood and Affect: Mood normal.        Speech: Speech normal.        Behavior: Behavior normal. Behavior is cooperative.        Cognition and Memory: Cognition and memory normal.     Results for orders placed or performed in visit on 09/26/22  Hemoglobin A1c  Result Value Ref Range   Hgb A1c MFr Bld 6.4 (H) 4.8 - 5.6 %   Est. average glucose Bld gHb Est-mCnc 137 mg/dL  CBC with Differential/Platelet  Result Value Ref Range   WBC 6.0 3.4 - 10.8 x10E3/uL   RBC 4.71 3.77 - 5.28 x10E6/uL   Hemoglobin 13.3 11.1 - 15.9 g/dL   Hematocrit 16.1 09.6 - 46.6 %   MCV 84 79 - 97 fL   MCH 28.2 26.6 - 33.0 pg   MCHC 33.8 31.5 - 35.7 g/dL   RDW 04.5 40.9 - 81.1 %   Platelets 286 150 - 450 x10E3/uL   Neutrophils 43 Not Estab. %   Lymphs 48 Not Estab. %   Monocytes 7 Not Estab. %   Eos 2 Not Estab. %   Basos 0 Not Estab. %   Neutrophils Absolute 2.5 1.4 - 7.0 x10E3/uL   Lymphocytes Absolute 2.9 0.7 - 3.1 x10E3/uL   Monocytes Absolute 0.4 0.1 - 0.9 x10E3/uL   EOS (ABSOLUTE) 0.1 0.0 - 0.4 x10E3/uL   Basophils Absolute 0.0 0.0 - 0.2 x10E3/uL   Immature Granulocytes 0 Not Estab. %   Immature Grans (Abs) 0.0 0.0 - 0.1 x10E3/uL  Comprehensive metabolic panel  Result Value Ref Range   Glucose 101 (H) 70 - 99 mg/dL   BUN 12 6 - 24 mg/dL   Creatinine, Ser 9.14 0.57 - 1.00 mg/dL   eGFR 87 >78 GN/FAO/1.30   BUN/Creatinine Ratio 15 9 - 23   Sodium 140 134 - 144 mmol/L   Potassium 4.5 3.5 - 5.2 mmol/L   Chloride 103 96 - 106 mmol/L   CO2 22 20 - 29 mmol/L   Calcium 9.3 8.7 - 10.2 mg/dL   Total Protein 6.9 6.0 - 8.5 g/dL   Albumin 4.5 3.8 - 4.9 g/dL   Globulin, Total 2.4 1.5 - 4.5 g/dL   Albumin/Globulin Ratio 1.9 1.2 - 2.2   Bilirubin Total 0.4 0.0 - 1.2 mg/dL   Alkaline Phosphatase 85 44 - 121 IU/L   AST 22 0 - 40 IU/L   ALT 26  0 - 32 IU/L  Lipid Cascade  Result Value Ref  Range   Cholesterol, Total 215 (H) 100 - 199 mg/dL   HDL 54 >16 mg/dL   LDL/HDL Ratio 2.7 0.0 - 3.2 ratio   Total Non-HDL-Chol (LDL+VLDL) 161 (H) 0 - 129 mg/dL   Triglycerides 90 0 - 149 mg/dL   LDL Chol Calc (NIH) 109 (H) 0 - 99 mg/dL         Assessment & Plan:   Problem List Items Addressed This Visit     Routine general medical examination at a health care facility    CPE completed today.   Labs ordered. Will make changes as necessary based on results.  Review of HM activities and recommendations discussed and provided on AVS Anticipatory guidance, diet, and exercise recommendations provided.  Medications, allergies, and hx reviewed and updated as necessary.  Plan to f/u with CPE in 1 year or sooner for acute/chronic health needs as directed.        Hyperlipidemia    Labs pending today. No alarm symptoms present. Currently managed with simvastatin and tolerating well. Will make changes to the plan of care as necessary based on results.       Relevant Medications   Boric Acid Vaginal 600 MG SUPP   Other Relevant Orders   Hemoglobin A1c (Completed)   CBC with Differential/Platelet (Completed)   Comprehensive metabolic panel (Completed)   Lipid Cascade (Completed)   Prediabetes    Labs pending today. No alarm symptoms present. Currently managed with diet and exercise.  Will make changes to the plan of care as necessary based on results.       Relevant Medications   Boric Acid Vaginal 600 MG SUPP   Other Relevant Orders   Hemoglobin A1c (Completed)   CBC with Differential/Platelet (Completed)   Comprehensive metabolic panel (Completed)   Lipid Cascade (Completed)   Chronic right-sided low back pain without sciatica - Primary    Patient describes an intermittent ache in the right lower back, possibly related to standing for long periods. No urinary symptoms or pain along the spinal area reported. Plan: - Order labs to check for inflammation and kidney function. -  Consider ordering an x-ray of the lumbar spine to assess for any structural abnormalities. - Advise the patient to try over-the-counter pain relief, such as ibuprofen, as needed. - Follow up with the patient to discuss lab results and determine if further imaging or referral to a specialist is necessary.      Relevant Orders   DG Lumbar Spine 2-3 Views   DG Thoracic Spine 2 View   History of anemia    Labs pending today. No alarm symptoms present. Will make changes to the plan of care as necessary based on results.       Relevant Medications   Boric Acid Vaginal 600 MG SUPP   Other Relevant Orders   Hemoglobin A1c (Completed)   CBC with Differential/Platelet (Completed)   Comprehensive metabolic panel (Completed)   Lipid Cascade (Completed)   BV (bacterial vaginosis)    Patient reports persistent odor despite previous treatment with metronidazole. Plan: - Prescribe boric acid suppositories for initial 3-4 nights, then once a week. - Recommend patient to consider taking a daily probiotic. - Follow up with the patient to assess the effectiveness of the treatment.           Follow up plan: Return in about 1 year (around 09/26/2023) for CPE.  NEXT PREVENTATIVE PHYSICAL DUE IN 1  YEAR.  PATIENT COUNSELING PROVIDED FOR ALL ADULT PATIENTS: A well balanced diet low in saturated fats, cholesterol, and moderation in carbohydrates.  This can be as simple as monitoring portion sizes and cutting back on sugary beverages such as soda and juice to start with.    Daily water consumption of at least 64 ounces.  Physical activity at least 180 minutes per week.  If just starting out, start 10 minutes a day and work your way up.   This can be as simple as taking the stairs instead of the elevator and walking 2-3 laps around the office  purposefully every day.   STD protection, partner selection, and regular testing if high risk.  Limited consumption of alcoholic beverages if alcohol is  consumed. For men, I recommend no more than 14 alcoholic beverages per week, spread out throughout the week (max 2 per day). Avoid "binge" drinking or consuming large quantities of alcohol in one setting.  Please let me know if you feel you may need help with reduction or quitting alcohol consumption.   Avoidance of nicotine, if used. Please let me know if you feel you may need help with reduction or quitting nicotine use.   Daily mental health attention. This can be in the form of 5 minute daily meditation, prayer, journaling, yoga, reflection, etc.  Purposeful attention to your emotions and mental state can significantly improve your overall wellbeing  and  Health.  Please know that I am here to help you with all of your health care goals and am happy to work with you to find a solution that works best for you.  The greatest advice I have received with any changes in life are to take it one step at a time, that even means if all you can focus on is the next 60 seconds, then do that and celebrate your victories.  With any changes in life, you will have set backs, and that is OK. The important thing to remember is, if you have a set back, it is not a failure, it is an opportunity to try again! Screening Testing Mammogram Every 1 -2 years based on history and risk factors Starting at age 63 Pap Smear Ages 21-39 every 3 years Ages 36-65 every 5 years with HPV testing More frequent testing may be required based on results and history Colon Cancer Screening Every 1-10 years based on test performed, risk factors, and history Starting at age 66 Bone Density Screening Every 2-10 years based on history Starting at age 38 for women Recommendations for men differ based on medication usage, history, and risk factors AAA Screening One time ultrasound Men 65-67 years old who have every smoked Lung Cancer Screening Low Dose Lung CT every 12 months Age 44-80 years with a 30 pack-year smoking  history who still smoke or who have quit within the last 15 years   Screening Labs Routine  Labs: Complete Blood Count (CBC), Complete Metabolic Panel (CMP), Cholesterol (Lipid Panel) Every 6-12 months based on history and medications May be recommended more frequently based on current conditions or previous results Hemoglobin A1c Lab Every 3-12 months based on history and previous results Starting at age 38 or earlier with diagnosis of diabetes, high cholesterol, BMI >26, and/or risk factors Frequent monitoring for patients with diabetes to ensure blood sugar control Thyroid Panel (TSH) Every 6 months based on history, symptoms, and risk factors May be repeated more often if on medication HIV One time testing for all patients 13  and older May be repeated more frequently for patients with increased risk factors or exposure Hepatitis C One time testing for all patients 67 and older May be repeated more frequently for patients with increased risk factors or exposure Gonorrhea, Chlamydia Every 12 months for all sexually active persons 13-24 years Additional monitoring may be recommended for those who are considered high risk or who have symptoms Every 12 months for any woman on birth control, regardless of sexual activity PSA Men 25-59 years old with risk factors Additional screening may be recommended from age 48-69 based on risk factors, symptoms, and history  Vaccine Recommendations Tetanus Booster All adults every 10 years Flu Vaccine All patients 6 months and older every year COVID Vaccine All patients 12 years and older Initial dosing with booster May recommend additional booster based on age and health history HPV Vaccine 2 doses all patients age 75-26 Dosing may be considered for patients over 26 Shingles Vaccine (Shingrix) 2 doses all adults 55 years and older Pneumonia (Pneumovax 70) All adults 65 years and older May recommend earlier dosing based on health  history One year apart from Prevnar 79 Pneumonia (Prevnar 45) All adults 65 years and older Dosed 1 year after Pneumovax 23 Pneumonia (Prevnar 20) One time alternative to the two dosing of 13 and 23 For all adults with initial dose of 23, 20 is recommended 1 year later For all adults with initial dose of 13, 23 is still recommended as second option 1 year later

## 2022-09-27 LAB — COMPREHENSIVE METABOLIC PANEL
ALT: 26 IU/L (ref 0–32)
AST: 22 IU/L (ref 0–40)
Albumin/Globulin Ratio: 1.9 (ref 1.2–2.2)
Albumin: 4.5 g/dL (ref 3.8–4.9)
Alkaline Phosphatase: 85 IU/L (ref 44–121)
BUN/Creatinine Ratio: 15 (ref 9–23)
BUN: 12 mg/dL (ref 6–24)
Bilirubin Total: 0.4 mg/dL (ref 0.0–1.2)
CO2: 22 mmol/L (ref 20–29)
Calcium: 9.3 mg/dL (ref 8.7–10.2)
Chloride: 103 mmol/L (ref 96–106)
Creatinine, Ser: 0.79 mg/dL (ref 0.57–1.00)
Globulin, Total: 2.4 g/dL (ref 1.5–4.5)
Glucose: 101 mg/dL — ABNORMAL HIGH (ref 70–99)
Potassium: 4.5 mmol/L (ref 3.5–5.2)
Sodium: 140 mmol/L (ref 134–144)
Total Protein: 6.9 g/dL (ref 6.0–8.5)
eGFR: 87 mL/min/{1.73_m2} (ref >59)

## 2022-09-27 LAB — CBC WITH DIFFERENTIAL/PLATELET
Basophils Absolute: 0 10*3/uL (ref 0.0–0.2)
Basos: 0 %
EOS (ABSOLUTE): 0.1 10*3/uL (ref 0.0–0.4)
Eos: 2 %
Hematocrit: 39.4 % (ref 34.0–46.6)
Hemoglobin: 13.3 g/dL (ref 11.1–15.9)
Immature Grans (Abs): 0 10*3/uL (ref 0.0–0.1)
Immature Granulocytes: 0 %
Lymphocytes Absolute: 2.9 10*3/uL (ref 0.7–3.1)
Lymphs: 48 %
MCH: 28.2 pg (ref 26.6–33.0)
MCHC: 33.8 g/dL (ref 31.5–35.7)
MCV: 84 fL (ref 79–97)
Monocytes Absolute: 0.4 10*3/uL (ref 0.1–0.9)
Monocytes: 7 %
Neutrophils Absolute: 2.5 10*3/uL (ref 1.4–7.0)
Neutrophils: 43 %
Platelets: 286 10*3/uL (ref 150–450)
RBC: 4.71 x10E6/uL (ref 3.77–5.28)
RDW: 12.1 % (ref 11.7–15.4)
WBC: 6 10*3/uL (ref 3.4–10.8)

## 2022-09-27 LAB — LIPID CASCADE
Cholesterol, Total: 215 mg/dL — ABNORMAL HIGH (ref 100–199)
HDL: 54 mg/dL (ref >39)
LDL Chol Calc (NIH): 145 mg/dL — ABNORMAL HIGH (ref 0–99)
LDL/HDL Ratio: 2.7 ratio (ref 0.0–3.2)
Total Non-HDL-Chol (LDL+VLDL): 161 mg/dL — ABNORMAL HIGH (ref 0–129)
Triglycerides: 90 mg/dL (ref 0–149)

## 2022-09-27 LAB — HEMOGLOBIN A1C
Est. average glucose Bld gHb Est-mCnc: 137 mg/dL
Hgb A1c MFr Bld: 6.4 % — ABNORMAL HIGH (ref 4.8–5.6)

## 2022-10-01 ENCOUNTER — Other Ambulatory Visit: Payer: Self-pay | Admitting: Nurse Practitioner

## 2022-10-01 DIAGNOSIS — E782 Mixed hyperlipidemia: Secondary | ICD-10-CM

## 2022-10-01 MED ORDER — SIMVASTATIN 40 MG PO TABS: 40.0000 mg | ORAL_TABLET | Freq: Every day | ORAL | 3 refills | Status: DC

## 2022-10-13 DIAGNOSIS — B9689 Other specified bacterial agents as the cause of diseases classified elsewhere: Secondary | ICD-10-CM | POA: Insufficient documentation

## 2022-10-13 DIAGNOSIS — N760 Acute vaginitis: Secondary | ICD-10-CM | POA: Insufficient documentation

## 2022-10-13 HISTORY — DX: Other specified bacterial agents as the cause of diseases classified elsewhere: B96.89

## 2022-10-13 NOTE — Assessment & Plan Note (Signed)
Labs pending today. No alarm symptoms present. Will make changes to the plan of care as necessary based on results.

## 2022-10-13 NOTE — Assessment & Plan Note (Signed)
Labs pending today. No alarm symptoms present. Currently managed with diet and exercise.  Will make changes to the plan of care as necessary based on results.

## 2022-10-13 NOTE — Assessment & Plan Note (Signed)
Patient reports persistent odor despite previous treatment with metronidazole. Plan: - Prescribe boric acid suppositories for initial 3-4 nights, then once a week. - Recommend patient to consider taking a daily probiotic. - Follow up with the patient to assess the effectiveness of the treatment.

## 2022-10-13 NOTE — Assessment & Plan Note (Signed)
Patient describes an intermittent ache in the right lower back, possibly related to standing for long periods. No urinary symptoms or pain along the spinal area reported. Plan: - Order labs to check for inflammation and kidney function. - Consider ordering an x-ray of the lumbar spine to assess for any structural abnormalities. - Advise the patient to try over-the-counter pain relief, such as ibuprofen, as needed. - Follow up with the patient to discuss lab results and determine if further imaging or referral to a specialist is necessary.

## 2022-10-13 NOTE — Assessment & Plan Note (Signed)
Labs pending today. No alarm symptoms present. Currently managed with simvastatin and tolerating well. Will make changes to the plan of care as necessary based on results.

## 2022-10-13 NOTE — Assessment & Plan Note (Signed)
CPE completed today.   Labs ordered. Will make changes as necessary based on results.  Review of HM activities and recommendations discussed and provided on AVS Anticipatory guidance, diet, and exercise recommendations provided.  Medications, allergies, and hx reviewed and updated as necessary.  Plan to f/u with CPE in 1 year or sooner for acute/chronic health needs as directed.   

## 2022-11-11 LAB — HM PAP SMEAR: HM Pap smear: NEGATIVE

## 2022-11-12 ENCOUNTER — Encounter: Payer: Self-pay | Admitting: Internal Medicine

## 2022-12-18 ENCOUNTER — Encounter: Payer: Self-pay | Admitting: Nurse Practitioner

## 2022-12-18 ENCOUNTER — Other Ambulatory Visit: Payer: Self-pay | Admitting: Podiatry

## 2022-12-23 ENCOUNTER — Telehealth: Payer: Self-pay | Admitting: Nurse Practitioner

## 2022-12-23 NOTE — Telephone Encounter (Signed)
Pt left message that she [place]vastatin at the airport or if it was gotten rid of it when she was going thru.  Would like refill sent in.

## 2022-12-24 MED ORDER — SIMVASTATIN 40 MG PO TABS: 40.0000 mg | ORAL_TABLET | Freq: Every day | ORAL | 0 refills | Status: DC

## 2022-12-24 NOTE — Telephone Encounter (Signed)
I have sent a 30 day supply in to pharmacy.

## 2023-04-23 ENCOUNTER — Other Ambulatory Visit: Payer: Self-pay | Admitting: Surgery

## 2023-04-23 DIAGNOSIS — Z1239 Encounter for other screening for malignant neoplasm of breast: Secondary | ICD-10-CM

## 2023-04-30 ENCOUNTER — Encounter: Payer: Self-pay | Admitting: Surgery

## 2023-06-15 ENCOUNTER — Ambulatory Visit
Admission: RE | Admit: 2023-06-15 | Discharge: 2023-06-15 | Disposition: A | Discharge status: Home / Self Care | Payer: 59 | Source: Ambulatory Visit | Attending: Surgery | Admitting: Surgery

## 2023-06-15 DIAGNOSIS — Z1239 Encounter for other screening for malignant neoplasm of breast: Secondary | ICD-10-CM

## 2023-06-15 MED ORDER — GADOPICLENOL 0.5 MMOL/ML IV SOLN
9.0000 mL | Freq: Once | INTRAVENOUS | Status: AC | PRN
  Administered 2023-06-15: 9 mL via INTRAVENOUS

## 2023-08-31 ENCOUNTER — Ambulatory Visit: Payer: Self-pay

## 2023-08-31 NOTE — Telephone Encounter (Signed)
 Called patient, [name],  and she states she was calling on her daughter, [name]'s,  behalf to ask for a referral to get her in with an in office counselor. She states her daughter does not have insurance and she is not established with a PCP. Mother answered triage questions for the patient, [name]. Provided [name] with [facility] and outpatient facilities. She is requesting if the office has any additional recommendations for any private counselors/outpatient resources.  Copied from CRM [id number]. Topic: Clinical - Red Word Triage >> Aug 31, 2023  9:13 AM [name] wrote: [facility] that prompted transfer to Nurse Triage: Patient called this morning requesting an appointment with her PCP to be seen for depression.   Callback Number:[phone] Reason for Disposition  [1] Caller requesting NON-URGENT health information AND [2] PCP's office is the best resource  Answer Assessment - Initial Assessment Questions 1. REASON FOR CALL or QUESTION: "What is your reason for calling today?" or "How can I best help you?" or "What question do you have that I can help answer?"    [name] was calling in on behalf of her daughter, [name]. Her daughter is not established with a PCP and she wanted to know if there were any resources we could recommend for a counselor for mental health. Recommended [place] [facility] and [place] resources. [name] is asking if there are any nice, private out of pocket counselors we would recommend.  Protocols used: Information Only Call - No Triage-A-AH

## 2023-10-07 ENCOUNTER — Other Ambulatory Visit: Payer: Self-pay | Admitting: Nurse Practitioner

## 2023-10-07 DIAGNOSIS — E782 Mixed hyperlipidemia: Secondary | ICD-10-CM

## 2023-10-12 ENCOUNTER — Encounter: Payer: Self-pay | Admitting: Nurse Practitioner

## 2023-10-12 ENCOUNTER — Ambulatory Visit: Payer: 59 | Admitting: Nurse Practitioner

## 2023-10-12 VITALS — BP 130/80 | HR 72 | Ht 67.0 in | Wt 210.2 lb

## 2023-10-12 DIAGNOSIS — M25431 Effusion, right wrist: Secondary | ICD-10-CM | POA: Diagnosis not present

## 2023-10-12 DIAGNOSIS — J0140 Acute pansinusitis, unspecified: Secondary | ICD-10-CM | POA: Insufficient documentation

## 2023-10-12 DIAGNOSIS — E782 Mixed hyperlipidemia: Secondary | ICD-10-CM

## 2023-10-12 DIAGNOSIS — Z0000 Encounter for general adult medical examination without abnormal findings: Secondary | ICD-10-CM

## 2023-10-12 DIAGNOSIS — R7303 Prediabetes: Secondary | ICD-10-CM | POA: Diagnosis not present

## 2023-10-12 DIAGNOSIS — M25432 Effusion, left wrist: Secondary | ICD-10-CM

## 2023-10-12 MED ORDER — HYDROCOD POLI-CHLORPHE POLI ER 10-8 MG/5ML PO SUER: 5.0000 mL | Freq: Two times a day (BID) | ORAL | 0 refills | Status: DC | PRN

## 2023-10-12 MED ORDER — AMOXICILLIN-POT CLAVULANATE 875-125 MG PO TABS: 1.0000 | ORAL_TABLET | Freq: Two times a day (BID) | ORAL | 0 refills | Status: DC

## 2023-10-12 MED ORDER — FLUCONAZOLE 150 MG PO TABS: ORAL_TABLET | ORAL | 2 refills | Status: DC

## 2023-10-12 MED ORDER — SIMVASTATIN 40 MG PO TABS
40.0000 mg | ORAL_TABLET | Freq: Every day | ORAL | 3 refills | Status: DC
Start: 2023-10-12 — End: 2024-03-09

## 2023-10-12 NOTE — Assessment & Plan Note (Signed)

## 2023-10-12 NOTE — Patient Instructions (Addendum)
 Hold your simvastatin  while you are on the antibiotic to help reduce the side effects. You can restart this once you finish the medication.   You can add mucinex to see if this helps to bring up some of the mucus in your lungs.   If you are not feeling better in the next week or so, please let me know.    For all adult patients, I recommend A well balanced diet low in saturated fats, cholesterol, and moderation in carbohydrates.   This can be as simple as monitoring portion sizes and cutting back on sugary beverages such as soda and juice to start with.    Daily water consumption of at least 64 ounces.  Physical activity at least 180 minutes per week, if just starting out.   This can be as simple as taking the stairs instead of the elevator and walking 2-3 laps around the office  purposefully every day.   STD protection, partner selection, and regular testing if high risk.  Limited consumption of alcoholic beverages if alcohol is consumed.  For women, I recommend no more than 7 alcoholic beverages per week, spread out throughout the week.  Avoid "binge" drinking or consuming large quantities of alcohol in one setting.   Please let me know if you feel you may need help with reduction or quitting alcohol consumption.   Avoidance of nicotine, if used.  Please let me know if you feel you may need help with reduction or quitting nicotine use.   Daily mental health attention.  This can be in the form of 5 minute daily meditation, prayer, journaling, yoga, reflection, etc.   Purposeful attention to your emotions and mental state can significantly improve your overall wellbeing  and  Health.  Please know that I am here to help you with all of your health care goals and am happy to work with you to find a solution that works best for you.  The greatest advice I have received with any changes in life are to take it one step at a time, that even means if all you can focus on is the next 60  seconds, then do that and celebrate your victories.  With any changes in life, you will have set backs, and that is OK. The important thing to remember is, if you have a set back, it is not a failure, it is an opportunity to try again!  Health Maintenance Recommendations Screening Testing Mammogram Every 1 -2 years based on history and risk factors Starting at age 79 Pap Smear Ages 21-39 every 3 years Ages 108-65 every 5 years with HPV testing More frequent testing may be required based on results and history Colon Cancer Screening Every 1-10 years based on test performed, risk factors, and history Starting at age 73 Bone Density Screening Every 2-10 years based on history Starting at age 75 for women Recommendations for men differ based on medication usage, history, and risk factors AAA Screening One time ultrasound Men 52-68 years old who have every smoked Lung Cancer Screening Low Dose Lung CT every 12 months Age 82-80 years with a 30 pack-year smoking history who still smoke or who have quit within the last 15 years  Screening Labs Routine  Labs: Complete Blood Count (CBC), Complete Metabolic Panel (CMP), Cholesterol (Lipid Panel) Every 6-12 months based on history and medications May be recommended more frequently based on current conditions or previous results Hemoglobin A1c Lab Every 3-12 months based on history and previous results  Starting at age 16 or earlier with diagnosis of diabetes, high cholesterol, BMI >26, and/or risk factors Frequent monitoring for patients with diabetes to ensure blood sugar control Thyroid Panel (TSH w/ T3 & T4) Every 6 months based on history, symptoms, and risk factors May be repeated more often if on medication HIV One time testing for all patients 95 and older May be repeated more frequently for patients with increased risk factors or exposure Hepatitis C One time testing for all patients 76 and older May be repeated more frequently  for patients with increased risk factors or exposure Gonorrhea, Chlamydia Every 12 months for all sexually active persons 13-24 years Additional monitoring may be recommended for those who are considered high risk or who have symptoms PSA Men 63-94 years old with risk factors Additional screening may be recommended from age 47-69 based on risk factors, symptoms, and history  Vaccine Recommendations Tetanus Booster All adults every 10 years Flu Vaccine All patients 6 months and older every year COVID Vaccine All patients 12 years and older Initial dosing with booster May recommend additional booster based on age and health history HPV Vaccine 2 doses all patients age 51-26 Dosing may be considered for patients over 26 Shingles Vaccine (Shingrix) 2 doses all adults 55 years and older Pneumonia (Pneumovax 23) All adults 65 years and older May recommend earlier dosing based on health history Pneumonia (Prevnar 34) All adults 65 years and older Dosed 1 year after Pneumovax 23  Additional Screening, Testing, and Vaccinations may be recommended on an individualized basis based on family history, health history, risk factors, and/or exposure.

## 2023-10-12 NOTE — Progress Notes (Signed)
 Dell Fennel, DNP, AGNP-c [facility] [address] [place], [place] [address] Main Office [phone]  BP 130/80   Pulse 72   Ht 5\' 7"  (1.702 m)   Wt 210 lb 3.2 oz (95.3 kg)   LMP 10/08/2011   BMI 32.92 kg/m    Subjective:    Patient ID: [name], female    DOB: 10-27-1965, 58 y.o.   MRN: [id number]  History of Present Illness [name] is a 58 year old female who presents for CPE. She also has concerns with headache, cough, and sinus congestion.  She has been experiencing a headache and severe cough since Friday, possibly starting on Thursday. The headache is described as a pressure sensation. She has been taking Tylenol  Cold and Flu, both daytime and nighttime formulations, and a generic version of Nyquil, but these have not provided significant relief. Symptoms worsen in the evening.  She denies fevers or chills but feels congested. No body aches. Her throat was sore last week, which she attributes to drainage, but it has since improved. No fullness in her throat or difficulty swallowing. She experiences a hard cough but is unable to expectorate any mucus.  No swelling in her feet or ankles. She mentions a persistent issue with her wrist, which appears swollen, but attributes it to fat deposition rather than fluid retention.  She is currently taking simvastatin  and expresses concern about potential interactions with antibiotics, as she has experienced stomach upset in the past when taking medications containing ibuprofen . She recently had her simvastatin  prescription refilled for 90 days.  No changes in bowel or bladder habits, vaginal bleeding, or recent sexual activity. She mentions having undergone an MRI a few months ago and is scheduled for a mammogram in six months as part of her high-risk breast cancer surveillance due to previous surgeries on her left breast.  She is responsible for childcare during the day and has a busy  schedule, which impacts her ability to rest when unwell.   Pertinent items are noted in HPI.   Most Recent Depression Screen:     10/12/2023    9:43 AM 09/26/2022    9:37 AM 01/09/2022   11:13 AM 09/04/2020    3:19 PM 05/27/2019   10:33 AM  Depression screen PHQ 2/9  Decreased Interest 0 0 0 0 0  Down, Depressed, Hopeless 0 0 0 0 0  PHQ - 2 Score 0 0 0 0 0   Most Recent Anxiety Screen:      No data to display         Most Recent Fall Screen:    10/12/2023    9:43 AM 09/26/2022    9:37 AM 01/09/2022   11:13 AM 05/29/2021    9:28 AM 09/04/2020    3:19 PM  Fall Risk   Falls in the past year? 0 0 0 0 0  Number falls in past yr: 0 0 0 0 0  Injury with Fall? 0 0 0 0 0  Risk for fall due to : No Fall Risks No Fall Risks No Fall Risks No Fall Risks No Fall Risks  Follow up Falls evaluation completed Falls evaluation completed Falls evaluation completed Falls evaluation completed Falls evaluation completed    Past medical history, surgical history, medications, allergies, family history and social history reviewed with patient today and changes made to appropriate areas of the chart.  Past Medical History:  Past Medical History:  Diagnosis Date   Abdominal pain, epigastric 07/09/2015   Anemia  BV (bacterial vaginosis) 10/13/2022   COVID-19 07/15/2022   Dental infection 09/03/2022   Encounter for routine gynecological examination    Dr. 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   Multiple Vitamin (MULTIVITAMIN) capsule Take 1 capsule by mouth daily.   No current facility-administered medications on file prior to visit.   Surgical History:  Past Surgical History:  Procedure Laterality Date   BREAST BIOPSY Left 04/2019   ADH w/ calcs   BREAST BIOPSY Right 04/2019   fibrocystic change w/ calcs   BREAST EXCISIONAL BIOPSY Left 06/07/2019   atypical ductal hyperplasia (cm)    BREAST LUMPECTOMY WITH RADIOACTIVE SEED LOCALIZATION Left 06/07/2019   Procedure: LEFT BREAST LUMPECTOMY WITH RADIOACTIVE SEED LOCALIZATION;  Surgeon: Sim Dryer, MD;  Location: [facility];  Service: General;  Laterality: Left;   BREAST LUMPECTOMY WITH RADIOACTIVE SEED LOCALIZATION Left 10/02/2021   Procedure: LEFT BREAST LUMPECTOMY WITH RADIOACTIVE SEED LOCALIZATION;  Surgeon: Sim Dryer, MD;  Location: [facility];  Service: General;  Laterality: Left;   DILATATION & CURRETTAGE/HYSTEROSCOPY WITH RESECTOCOPE N/A 10/31/2014   Procedure: DILATATION & CURETTAGE/HYSTEROSCOPY WITH RESECTOCOPE, RESECTION OF POLYP WITH MYOSURE ;  Surgeon: Marylu Soda, MD;  Location: [facility];  Service: Gynecology;  Laterality: N/A;   ECTOPIC PREGNANCY SURGERY     MYOMECTOMY     TONSILLECTOMY  as a child   TUBAL LIGATION     WISDOM TOOTH EXTRACTION Bilateral    Allergies:  Allergies  Allergen Reactions   Aspirin Nausea Only and Other (See Comments)    Stomach cramping   Ibuprofen      Pt stated, "Upsets stomach while I am on my cholesterol medicine"   Family History:  Family History  Problem Relation Age of Onset   Hypertension Mother    Hepatitis Mother        Hepatitis C   Asthma Sister    Breast cancer Sister 15   Diabetes Maternal Grandmother        leg amputation   Heart  disease Maternal Grandmother    Hypertension Maternal Grandmother    Stroke Maternal Aunt 50       2   Cirrhosis Maternal Aunt    Cancer Paternal Aunt        lung cancer   Breast cancer Cousin        first cousins once removed x2   Breast cancer Other        mat great aunts x3   Colon cancer Neg Hx    Rectal cancer Neg Hx    Stomach cancer Neg Hx    Colon polyps Neg Hx        Objective:    BP  130/80   Pulse 72   Ht 5\' 7"  (1.702 m)   Wt 210 lb 3.2 oz (95.3 kg)   LMP 10/08/2011   BMI 32.92 kg/m   Wt Readings from Last 3 Encounters:  10/12/23 210 lb 3.2 oz (95.3 kg)  09/26/22 212 lb (96.2 kg)  08/28/22 210 lb 3.2 oz (95.3 kg)    Physical Exam Vitals and nursing note reviewed.  Constitutional:      General: She is not in acute distress.    Appearance: Normal appearance.  HENT:     Head: Normocephalic.     Right Ear: Tympanic membrane normal.     Left Ear: Tympanic membrane normal.     Nose: Congestion and rhinorrhea present.     Right Sinus: Maxillary sinus tenderness and frontal sinus tenderness present.     Left Sinus: Frontal sinus tenderness present.     Mouth/Throat:     Mouth: Mucous membranes are moist.     Pharynx: Posterior oropharyngeal erythema present. No oropharyngeal exudate.  Eyes:     Conjunctiva/sclera: Conjunctivae normal.     Pupils: Pupils are equal, round, and reactive to light.  Neck:     Vascular: No carotid bruit.  Cardiovascular:     Rate and Rhythm: Normal rate and regular rhythm.     Pulses: Normal pulses.     Heart sounds: Normal heart sounds.  Pulmonary:     Effort: Pulmonary effort is normal.     Breath sounds: Examination of the right-upper field reveals rhonchi. Examination of the right-middle field reveals rhonchi. Examination of the right-lower field reveals rhonchi. Examination of the left-lower field reveals rhonchi. Rhonchi present.  Abdominal:     General: Bowel sounds are normal. There is no distension.     Palpations:  Abdomen is soft. There is no mass.     Tenderness: There is no abdominal tenderness. There is no right CVA tenderness, left CVA tenderness or guarding.  Musculoskeletal:        General: Normal range of motion.     Cervical back: Normal range of motion.     Right lower leg: No edema.     Left lower leg: No edema.  Lymphadenopathy:     Cervical: Cervical adenopathy present.  Skin:    General: Skin is warm and dry.     Capillary Refill: Capillary refill takes less than 2 seconds.  Neurological:     Mental Status: She is alert and oriented to person, place, and time.     Sensory: No sensory deficit.     Motor: No weakness.     Coordination: Coordination normal.     Gait: Gait normal.  Psychiatric:        Mood and Affect: Mood normal.        Behavior: Behavior normal.      Results for orders placed or performed in visit on 12/18/22  HM PAP SMEAR   Collection Time: 11/11/22 12:00 AM  Result Value Ref Range   HM Pap smear negative        Assessment & Plan:   Problem List Items Addressed This Visit     Routine general medical examination at a health care facility - Primary   CPE completed today. Review of HM activities and recommendations discussed and provided on AVS. Anticipatory guidance, diet, and exercise recommendations provided. Medications, allergies, and hx reviewed and updated as necessary. Orders placed as listed below.  Plan: - Labs ordered. Will make changes as necessary based on results.  -  I will review these results and send recommendations via MyChart or a telephone call.  - F/U with CPE in 1 year or sooner for acute/chronic health needs as directed.         Relevant Orders   CBC with Differential/Platelet   CMP14+EGFR   Hemoglobin A1c   Lipid panel   Hyperlipidemia   Hyperlipidemia managed with simvastatin . Reports gastrointestinal upset with antibiotics, especially those containing ibuprofen , and concerns about simvastatin  interactions. Plan to hold  simvastatin  during antibiotic course to avoid interactions. - Check cholesterol levels. - Hold simvastatin  while taking antibiotics.      Relevant Medications   simvastatin  (ZOCOR ) 40 MG tablet   Other Relevant Orders   CBC with Differential/Platelet   CMP14+EGFR   Hemoglobin A1c   Lipid panel   Prediabetes   Labs pending today. No changes at this time. Low carb, low fat diet encouraged.       Relevant Orders   CBC with Differential/Platelet   CMP14+EGFR   Hemoglobin A1c   Lipid panel   Swelling of both wrists   Fatty tissue present on the dorsal surface of the wrists bilaterally with no concerning qualities.       Acute non-recurrent pansinusitis   Acute sinusitis with onset of symptoms on Friday, possibly Thursday. Symptoms include headache with pressure, cough, tender sinuses, swollen cervical lymph nodes, and crackles in the lungs. Differential includes a new virus, but absence of myalgia and chills supports sinusitis. History of yeast infections with antibiotics. - Prescribe Augmentin twice daily for five days with food. - Prescribe fluconazole  to prevent yeast infection, to be taken at the first sign of symptoms or post-antibiotic course. - Prescribe cough medicine for nighttime use.      Relevant Medications   amoxicillin-clavulanate (AUGMENTIN) 875-125 MG tablet   fluconazole  (DIFLUCAN ) 150 MG tablet   chlorpheniramine-HYDROcodone  (TUSSIONEX) 10-8 MG/5ML      Follow up plan: Return in about 1 year (around 10/11/2024) for CPE.  NEXT PREVENTATIVE PHYSICAL DUE IN 1 YEAR.  PATIENT COUNSELING PROVIDED FOR ALL ADULT PATIENTS: A well balanced diet low in saturated fats, cholesterol, and moderation in carbohydrates.  This can be as simple as monitoring portion sizes and cutting back on sugary beverages such as soda and juice to start with.    Daily water consumption of at least 64 ounces.  Physical activity at least 180 minutes per week.  If just starting out, start  10 minutes a day and work your way up.   This can be as simple as taking the stairs instead of the elevator and walking 2-3 laps around the office  purposefully every day.   STD protection, partner selection, and regular testing if high risk.  Limited consumption of alcoholic beverages if alcohol is consumed. For men, I recommend no more than 14 alcoholic beverages per week, spread out throughout the week (max 2 per day). Avoid "binge" drinking or consuming large quantities of alcohol in one setting.  Please let me know if you feel you may need help with reduction or quitting alcohol consumption.   Avoidance of nicotine, if used. Please let me know if you feel you may need help with reduction or quitting nicotine use.   Daily mental health attention. This can be in the form of 5 minute daily meditation, prayer, journaling, yoga, reflection, etc.  Purposeful attention to your emotions and mental state can significantly improve your overall wellbeing  and  Health.  Please know that I am here to help  you with all of your health care goals and am happy to work with you to find a solution that works best for you.  The greatest advice I have received with any changes in life are to take it one step at a time, that even means if all you can focus on is the next 60 seconds, then do that and celebrate your victories.  With any changes in life, you will have set backs, and that is OK. The important thing to remember is, if you have a set back, it is not a failure, it is an opportunity to try again! Screening Testing Mammogram Every 1 -2 years based on history and risk factors Starting at age 31 Pap Smear Ages 21-39 every 3 years Ages 60-65 every 5 years with HPV testing More frequent testing may be required based on results and history Colon Cancer Screening Every 1-10 years based on test performed, risk factors, and history Starting at age 85 Bone Density Screening Every 2-10 years based on  history Starting at age 58 for women Recommendations for men differ based on medication usage, history, and risk factors AAA Screening One time ultrasound Men 61-42 years old who have every smoked Lung Cancer Screening Low Dose Lung CT every 12 months Age 20-80 years with a 30 pack-year smoking history who still smoke or who have quit within the last 15 years   Screening Labs Routine  Labs: Complete Blood Count (CBC), Complete Metabolic Panel (CMP), Cholesterol (Lipid Panel) Every 6-12 months based on history and medications May be recommended more frequently based on current conditions or previous results Hemoglobin A1c Lab Every 3-12 months based on history and previous results Starting at age 77 or earlier with diagnosis of diabetes, high cholesterol, BMI >26, and/or risk factors Frequent monitoring for patients with diabetes to ensure blood sugar control Thyroid Panel (TSH) Every 6 months based on history, symptoms, and risk factors May be repeated more often if on medication HIV One time testing for all patients 50 and older May be repeated more frequently for patients with increased risk factors or exposure Hepatitis C One time testing for all patients 67 and older May be repeated more frequently for patients with increased risk factors or exposure Gonorrhea, Chlamydia Every 12 months for all sexually active persons 13-24 years Additional monitoring may be recommended for those who are considered high risk or who have symptoms Every 12 months for any woman on birth control, regardless of sexual activity PSA Men 32-58 years old with risk factors Additional screening may be recommended from age 33-69 based on risk factors, symptoms, and history  Vaccine Recommendations Tetanus Booster All adults every 10 years Flu Vaccine All patients 6 months and older every year COVID Vaccine All patients 12 years and older Initial dosing with booster May recommend additional booster  based on age and health history HPV Vaccine 2 doses all patients age 17-26 Dosing may be considered for patients over 26 Shingles Vaccine (Shingrix) 2 doses all adults 55 years and older Pneumonia (Pneumovax 10) All adults 65 years and older May recommend earlier dosing based on health history One year apart from Prevnar 41 Pneumonia (Prevnar 68) All adults 65 years and older Dosed 1 year after Pneumovax 23 Pneumonia (Prevnar 20) One time alternative to the two dosing of 13 and 23 For all adults with initial dose of 23, 20 is recommended 1 year later For all adults with initial dose of 13, 23 is still recommended as second option 1  year later

## 2023-10-12 NOTE — Assessment & Plan Note (Signed)
 Acute sinusitis with onset of symptoms on Friday, possibly Thursday. Symptoms include headache with pressure, cough, tender sinuses, swollen cervical lymph nodes, and crackles in the lungs. Differential includes a new virus, but absence of myalgia and chills supports sinusitis. History of yeast infections with antibiotics. - Prescribe Augmentin twice daily for five days with food. - Prescribe fluconazole  to prevent yeast infection, to be taken at the first sign of symptoms or post-antibiotic course. - Prescribe cough medicine for nighttime use.

## 2023-10-12 NOTE — Assessment & Plan Note (Signed)
 Labs pending today. No changes at this time. Low carb, low fat diet encouraged.

## 2023-10-12 NOTE — Assessment & Plan Note (Signed)
 Fatty tissue present on the dorsal surface of the wrists bilaterally with no concerning qualities.

## 2023-10-12 NOTE — Assessment & Plan Note (Signed)
 Hyperlipidemia managed with simvastatin . Reports gastrointestinal upset with antibiotics, especially those containing ibuprofen , and concerns about simvastatin  interactions. Plan to hold simvastatin  during antibiotic course to avoid interactions. - Check cholesterol levels. - Hold simvastatin  while taking antibiotics.

## 2023-10-13 ENCOUNTER — Telehealth: Payer: Self-pay | Admitting: *Deleted

## 2023-10-13 LAB — LIPID PANEL
Chol/HDL Ratio: 4.6 ratio — ABNORMAL HIGH (ref 0.0–4.4)
Cholesterol, Total: 196 mg/dL (ref 100–199)
HDL: 43 mg/dL (ref >39)
LDL Chol Calc (NIH): 133 mg/dL — ABNORMAL HIGH (ref 0–99)
Triglycerides: 109 mg/dL (ref 0–149)
VLDL Cholesterol Cal: 20 mg/dL (ref 5–40)

## 2023-10-13 LAB — CMP14+EGFR
ALT: 29 IU/L (ref 0–32)
AST: 26 IU/L (ref 0–40)
Albumin: 4.5 g/dL (ref 3.8–4.9)
Alkaline Phosphatase: 86 IU/L (ref 44–121)
BUN/Creatinine Ratio: 18 (ref 9–23)
BUN: 13 mg/dL (ref 6–24)
Bilirubin Total: 0.4 mg/dL (ref 0.0–1.2)
CO2: 21 mmol/L (ref 20–29)
Calcium: 9.5 mg/dL (ref 8.7–10.2)
Chloride: 103 mmol/L (ref 96–106)
Creatinine, Ser: 0.72 mg/dL (ref 0.57–1.00)
Globulin, Total: 2.6 g/dL (ref 1.5–4.5)
Glucose: 98 mg/dL (ref 70–99)
Potassium: 4.3 mmol/L (ref 3.5–5.2)
Sodium: 142 mmol/L (ref 134–144)
Total Protein: 7.1 g/dL (ref 6.0–8.5)
eGFR: 96 mL/min/{1.73_m2} (ref >59)

## 2023-10-13 LAB — CBC WITH DIFFERENTIAL/PLATELET
Basophils Absolute: 0 10*3/uL (ref 0.0–0.2)
Basos: 0 %
EOS (ABSOLUTE): 0.2 10*3/uL (ref 0.0–0.4)
Eos: 3 %
Hematocrit: 42.2 % (ref 34.0–46.6)
Hemoglobin: 13.7 g/dL (ref 11.1–15.9)
Immature Grans (Abs): 0 10*3/uL (ref 0.0–0.1)
Immature Granulocytes: 0 %
Lymphocytes Absolute: 3 10*3/uL (ref 0.7–3.1)
Lymphs: 46 %
MCH: 28.4 pg (ref 26.6–33.0)
MCHC: 32.5 g/dL (ref 31.5–35.7)
MCV: 88 fL (ref 79–97)
Monocytes Absolute: 0.5 10*3/uL (ref 0.1–0.9)
Monocytes: 7 %
Neutrophils Absolute: 2.8 10*3/uL (ref 1.4–7.0)
Neutrophils: 44 %
Platelets: 253 10*3/uL (ref 150–450)
RBC: 4.82 x10E6/uL (ref 3.77–5.28)
RDW: 12.3 % (ref 11.7–15.4)
WBC: 6.5 10*3/uL (ref 3.4–10.8)

## 2023-10-13 LAB — HEMOGLOBIN A1C
Est. average glucose Bld gHb Est-mCnc: 128 mg/dL
Hgb A1c MFr Bld: 6.1 % — ABNORMAL HIGH (ref 4.8–5.6)

## 2023-10-13 NOTE — Telephone Encounter (Signed)
 Copied from CRM [id number]. Topic: Clinical - Prescription Issue >> Oct 13, 2023  8:53 AM Hassie Lint wrote: Reason for CRM: Patient was in to see NP Early yesterday and was prescribed 4 medications. When got to the pharmacy to pick them up they stated they only got the request for 1. Patient did not receive: chlorpheniramine-HYDROcodone  (TUSSIONEX) 10-8 MG/5ML fluconazole  (DIFLUCAN ) 150 MG tablet simvastatin  (ZOCOR ) 40 MG tablet  Patient is requesting to resend the request.   Patient can be reached at [phone]   [facility] pharmacy and they will go ahead and fill the cough syrup and the diflucan , it was on hold-patient advised.

## 2023-10-14 ENCOUNTER — Ambulatory Visit: Payer: Self-pay | Admitting: Nurse Practitioner

## 2024-03-09 ENCOUNTER — Other Ambulatory Visit: Payer: Self-pay | Admitting: Nurse Practitioner

## 2024-03-09 DIAGNOSIS — E782 Mixed hyperlipidemia: Secondary | ICD-10-CM

## 2024-03-09 MED ORDER — SIMVASTATIN 40 MG PO TABS: 40.0000 mg | ORAL_TABLET | Freq: Every day | ORAL | 0 refills | Status: DC

## 2024-03-09 NOTE — Telephone Encounter (Signed)
 Copied from CRM [id number]. Topic: Clinical - Medication Refill >> Mar 09, 2024  1:51 PM Jasmin G wrote: Medication: simvastatin  (ZOCOR ) 40 MG tablet. Pt states that she went out of town recently and [place].  Has the patient contacted their pharmacy? Yes (Agent: If no, request that the patient contact the pharmacy for the refill. If patient does not wish to contact the pharmacy document the reason why and proceed with request.) (Agent: If yes, when and what did the pharmacy advise?)  This is the patient's preferred pharmacy:  [id number] - [place], [place] - [address] AT [facility] [address] [place] [address] Phone: [phone] Fax: [phone]  Is this the correct pharmacy for this prescription? Yes If no, delete pharmacy and type the correct one.   Has the prescription been filled recently? Yes  Is the patient out of the medication? Yes  Has the patient been seen for an appointment in the last year OR does the patient have an upcoming appointment? No  Can we respond through MyChart? No  Agent: Please be advised that Rx refills may take up to 3 business days. We ask that you follow-up with your pharmacy.

## 2024-03-09 NOTE — Telephone Encounter (Signed)
 Pt states that she went out of town recently and her med was [place] at the airport.

## 2024-04-19 ENCOUNTER — Other Ambulatory Visit: Payer: Self-pay | Admitting: Nurse Practitioner

## 2024-04-19 DIAGNOSIS — Z1231 Encounter for screening mammogram for malignant neoplasm of breast: Secondary | ICD-10-CM

## 2024-05-20 ENCOUNTER — Ambulatory Visit

## 2024-05-27 ENCOUNTER — Ambulatory Visit
Admission: RE | Admit: 2024-05-27 | Discharge: 2024-05-27 | Disposition: A | Discharge status: Home / Self Care | Source: Ambulatory Visit | Attending: Nurse Practitioner | Admitting: Nurse Practitioner

## 2024-05-27 DIAGNOSIS — Z1231 Encounter for screening mammogram for malignant neoplasm of breast: Secondary | ICD-10-CM

## 2024-05-31 ENCOUNTER — Encounter: Payer: Self-pay | Admitting: Nurse Practitioner

## 2024-05-31 ENCOUNTER — Ambulatory Visit: Admitting: Nurse Practitioner

## 2024-05-31 ENCOUNTER — Ambulatory Visit: Payer: Self-pay | Admitting: Nurse Practitioner

## 2024-05-31 VITALS — BP 120/76 | HR 62 | Wt 210.8 lb

## 2024-05-31 DIAGNOSIS — R7303 Prediabetes: Secondary | ICD-10-CM

## 2024-05-31 DIAGNOSIS — M79651 Pain in right thigh: Secondary | ICD-10-CM | POA: Diagnosis not present

## 2024-05-31 DIAGNOSIS — E782 Mixed hyperlipidemia: Secondary | ICD-10-CM

## 2024-05-31 MED ORDER — DICLOFENAC SODIUM 75 MG PO TBEC: 75.0000 mg | DELAYED_RELEASE_TABLET | Freq: Two times a day (BID) | ORAL | 0 refills | Status: AC

## 2024-05-31 MED ORDER — SIMVASTATIN 40 MG PO TABS: ORAL_TABLET | ORAL | 3 refills | Status: AC

## 2024-05-31 NOTE — Assessment & Plan Note (Addendum)
 Cholesterol levels are well-managed but not at target levels. Currently on simvastatin , which is well-tolerated. Simvastatin  upsets her stomach when used in combination with other medications for inflammation, but diclofenac  was not an issue. Blood pressure is well-controlled. - Ordered lipoprotein A test to assess cholesterol levels. - Continue simvastatin  as prescribed. - Encouraged dietary modifications with increased vegetable intake. Orders:   CMP14+EGFR   Lipid panel   Lipoprotein A (LPA)   simvastatin  (ZOCOR ) 40 MG tablet; Take 1 tablet by mouth at bedtime. For cholesterol.

## 2024-05-31 NOTE — Progress Notes (Signed)
 Pneumonia Vaccine Pt declined., Influenza Vaccine Pt declined., and Shingles (Shingrx) Vaccine Pt declined.  Catheline Doing, DNP, AGNP-c [facility]  [address] [place], [place] [address] [phone]   ESTABLISHED PATIENT- Chronic Health and/or Follow-Up Visit on 05/31/2024  Blood pressure 120/76, pulse 62, weight 210 lb 12.8 oz (95.6 kg), last menstrual period 10/08/2011.   Subjective:  Medical Management of Chronic Issues (Med check f/u on cholesterol, sharp pain rt. Upper thigh started Thursday last week off and on, )   History of Present Illness [name] is a 59 year old female who presents with intermittent sharp pain in the right upper thigh.  She experiences intermittent sharp pain in her right upper thigh, which began last week. The pain occurs two to three times daily, described as a sharp jolt, and is not constant. It is localized to the front of the thigh and does not radiate. The pain can occur while sitting or standing and is not associated with any known injury. There is no associated rash, redness, or warmth. The pain is sore when it occurs but does not persist.  Her current medications include simvastatin , taken at night before bed. She has previously taken diclofenac  for inflammation without adverse effects and prefers it over other anti-inflammatory medications due to its compatibility with simvastatin .  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 She reports not having much time for exercise due to long work hours, although she tries to include more vegetables in her diet. No back spasms, weakness in the leg, or changes in vision. No palpitations or significant changes in her health status.  ROS negative except for what is listed in HPI. History, Medications, Surgery, SDOH, and Family History reviewed and updated as appropriate.  Objective:  Physical Exam Vitals  and nursing note reviewed.  Constitutional:      General: She is not in acute distress.    Appearance: Normal appearance.  HENT:     Head: Normocephalic.  Eyes:     Conjunctiva/sclera: Conjunctivae normal.  Cardiovascular:     Rate and Rhythm: Normal rate and regular rhythm.     Pulses: Normal pulses.     Heart sounds: Normal heart sounds.  Pulmonary:     Effort: Pulmonary effort is normal.     Breath sounds: Normal breath sounds.  Abdominal:     General: Bowel sounds are normal.     Palpations: Abdomen is soft.  Musculoskeletal:        General: No swelling, tenderness or signs of injury.     Right lower leg: No edema.     Left lower leg: No edema.       Legs:  Skin:    General: Skin is warm and dry.     Capillary Refill: Capillary refill takes less than 2 seconds.  Neurological:     Mental Status: She is alert and oriented to person, place, and time.     Sensory: No sensory deficit.     Motor: No weakness.     Coordination: Coordination normal.     Gait: Gait normal.  Psychiatric:        Mood and Affect: Mood normal.         Assessment & Plan:   Assessment & Plan Pain of right thigh Intermittent sharp pain in the right thigh, occurring 2-3 times daily, possibly nerve-related. Differential includes nerve entrapment or inflammation. No rash, redness, or warmth. No significant back  issues. Possible nerve involvement from the lower back or hip. No tenderness along the muscle. No weakness in the leg. Pain is not constant and resolves quickly. - Prescribed diclofenac  for inflammation and nerve pain. - Recommended stretching exercises for the hip and back to alleviate nerve entrapment. - Advised to report if pain persists or worsens, and will consider x-ray if symptoms do not improve. Orders:   diclofenac  (VOLTAREN ) 75 MG EC tablet; Take 1 tablet (75 mg total) by mouth 2 (two) times daily.  Mixed hyperlipidemia Cholesterol levels are well-managed but not at target levels.  Currently on simvastatin , which is well-tolerated. Simvastatin  upsets her stomach when used in combination with other medications for inflammation, but diclofenac  was not an issue. Blood pressure is well-controlled. - Ordered lipoprotein A test to assess cholesterol levels. - Continue simvastatin  as prescribed. - Encouraged dietary modifications with increased vegetable intake. Orders:   CMP14+EGFR   Lipid panel   Lipoprotein A (LPA)   simvastatin  (ZOCOR ) 40 MG tablet; Take 1 tablet by mouth at bedtime. For cholesterol.  Prediabetes Blood sugar levels are stable, with occasional borderline readings. No significant changes in diet or exercise routine reported. Recommend close monitoring of diet and ensure that low fat, low carbohydrate dietary options are prioritized.  - Continue monitoring blood sugar levels. - Encouraged dietary modifications with increased vegetable intake. Orders:   Hemoglobin A1c    Camie FORBES Doing, DNP, AGNP-c

## 2024-05-31 NOTE — Patient Instructions (Addendum)
 Try the diclofenac  for 2 weeks and see if this helps with the thigh pain. If it does not improve, please let me know.    Dyslipidemia Dyslipidemia is an imbalance of waxy, fat-like substances (lipids) in the blood. The body needs lipids in small amounts. Dyslipidemia often involves a high level of cholesterol or triglycerides, which are types of lipids. Common forms of dyslipidemia include: High levels of LDL cholesterol. LDL is the type of cholesterol that causes fatty deposits (plaques) to build up in the blood vessels that carry blood away from the heart (arteries). Low levels of HDL cholesterol. HDL cholesterol is the type of cholesterol that protects against heart disease. High levels of HDL remove the LDL buildup from arteries. High levels of triglycerides. Triglycerides are a fatty substance in the blood that is linked to a buildup of plaques in the arteries. What are the causes? There are two main types of dyslipidemia: primary and secondary. Primary dyslipidemia is caused by changes (mutations) in genes that are passed down through families (inherited). These mutations cause several types of dyslipidemia. Secondary dyslipidemia may be caused by various risk factors that can lead to the disease, such as lifestyle choices and certain medical conditions. What increases the risk? You are more likely to develop this condition if you are an older man or if you are a woman who has gone through menopause. Other risk factors include: Having a family history of dyslipidemia. Taking certain medicines, including birth control pills, steroids, some diuretics, and beta-blockers. Eating a diet high in saturated fat. Smoking cigarettes or excessive alcohol intake. Having certain medical conditions such as diabetes, polycystic ovary syndrome (PCOS), kidney disease, liver disease, or hypothyroidism. Not exercising regularly. Being overweight or obese with too much belly fat. What are the signs or  symptoms? In most cases, dyslipidemia does not usually cause any symptoms. In severe cases, very high lipid levels can cause: Fatty bumps under the skin (xanthomas). A white or gray ring around the black center (pupil) of the eye. Very high triglyceride levels can cause inflammation of the pancreas (pancreatitis). How is this diagnosed? Your health care provider may diagnose dyslipidemia based on a routine blood test (fasting blood test). Because most people do not have symptoms of the condition, this blood testing (lipid profile) is done on adults age 29 and older and is repeated every 4-6 years. This test checks: Total cholesterol. This measures the total amount of cholesterol in your blood, including LDL cholesterol, HDL cholesterol, and triglycerides. A healthy number is below 200 mg/dL (4.82 mmol/L). LDL cholesterol. The target number for LDL cholesterol is different for each person, depending on individual risk factors. A healthy number is usually below 100 mg/dL (7.40 mmol/L). Ask your health care provider what your LDL cholesterol should be. HDL cholesterol. An HDL level of 60 mg/dL (8.44 mmol/L) or higher is best because it helps to protect against heart disease. A number below 40 mg/dL (8.96 mmol/L) for men or below 50 mg/dL (8.70 mmol/L) for women increases the risk for heart disease. Triglycerides. A healthy triglyceride number is below 150 mg/dL (8.30 mmol/L). If your lipid profile is abnormal, your health care provider may do other blood tests. How is this treated? Treatment depends on the type of dyslipidemia that you have and your other risk factors for heart disease and stroke. Your health care provider will have a target range for your lipid levels based on this information. Treatment for dyslipidemia starts with lifestyle changes, such as diet and exercise.  Your health care provider may recommend that you: Get regular exercise. Make changes to your diet. Quit smoking if you  smoke. Limit your alcohol intake. If diet changes and exercise do not help you reach your goals, your health care provider may also prescribe medicine to lower lipids. The most commonly prescribed type of medicine lowers your LDL cholesterol (statin drug). If you have a high triglyceride level, your provider may prescribe another type of drug (fibrate) or an omega-3 fish oil supplement, or both. Follow these instructions at home: Eating and drinking  Follow instructions from your health care provider or dietitian about eating or drinking restrictions. Eat a healthy diet as told by your health care provider. This can help you reach and maintain a healthy weight, lower your LDL cholesterol, and raise your HDL cholesterol. This may include: Limiting your calories, if you are overweight. Eating more fruits, vegetables, whole grains, fish, and lean meats. Limiting saturated fat, trans fat, and cholesterol. Do not drink alcohol if: Your health care provider tells you not to drink. You are pregnant, may be pregnant, or are planning to become pregnant. If you drink alcohol: Limit how much you have to: 0-1 drink a day for women. 0-2 drinks a day for men. Know how much alcohol is in your drink. In the [place] one drink equals one 12 oz bottle of beer (355 mL), one 5 oz glass of wine (148 mL), or one 1 oz glass of hard liquor (44 mL). Activity Get regular exercise. Start an exercise and strength training program as told by your health care provider. Ask your health care provider what activities are safe for you. Your health care provider may recommend: 30 minutes of aerobic activity 4-6 days a week. Brisk walking is an example of aerobic activity. Strength training 2 days a week. General instructions Do not use any products that contain nicotine or tobacco. These products include cigarettes, chewing tobacco, and vaping devices, such as e-cigarettes. If you need help quitting, ask your health care  provider. Take over-the-counter and prescription medicines only as told by your health care provider. This includes supplements. Keep all follow-up visits. This is important. Contact a health care provider if: You are having trouble sticking to your exercise or diet plan. You are struggling to quit smoking or to control your use of alcohol. Summary Dyslipidemia often involves a high level of cholesterol or triglycerides, which are types of lipids. Treatment depends on the type of dyslipidemia that you have and your other risk factors for heart disease and stroke. Treatment for dyslipidemia starts with lifestyle changes, such as diet and exercise. Your health care provider may prescribe medicine to lower lipids. This information is not intended to replace advice given to you by your health care provider. Make sure you discuss any questions you have with your health care provider. Document Revised: 11/29/2021 Document Reviewed: 07/02/2020 Elsevier Patient Education  2025 [employer].

## 2024-05-31 NOTE — Assessment & Plan Note (Addendum)
 Blood sugar levels are stable, with occasional borderline readings. No significant changes in diet or exercise routine reported. Recommend close monitoring of diet and ensure that low fat, low carbohydrate dietary options are prioritized.  - Continue monitoring blood sugar levels. - Encouraged dietary modifications with increased vegetable intake. Orders:   Hemoglobin A1c

## 2024-06-01 ENCOUNTER — Ambulatory Visit: Payer: Self-pay | Admitting: Nurse Practitioner

## 2024-06-01 DIAGNOSIS — E7841 Elevated Lipoprotein(a): Secondary | ICD-10-CM | POA: Insufficient documentation

## 2024-06-01 LAB — CMP14+EGFR
ALT: 22 IU/L (ref 0–32)
AST: 22 IU/L (ref 0–40)
Albumin: 4.6 g/dL (ref 3.8–4.9)
Alkaline Phosphatase: 82 IU/L (ref 49–135)
BUN/Creatinine Ratio: 21 (ref 12–28)
BUN: 15 mg/dL (ref 8–27)
Bilirubin Total: 0.4 mg/dL (ref 0.0–1.2)
CO2: 22 mmol/L (ref 20–29)
Calcium: 9.2 mg/dL (ref 8.7–10.3)
Chloride: 104 mmol/L (ref 96–106)
Creatinine, Ser: 0.73 mg/dL (ref 0.57–1.00)
Globulin, Total: 2.2 g/dL (ref 1.5–4.5)
Glucose: 96 mg/dL (ref 70–99)
Potassium: 4.5 mmol/L (ref 3.5–5.2)
Sodium: 141 mmol/L (ref 134–144)
Total Protein: 6.8 g/dL (ref 6.0–8.5)
eGFR: 94 mL/min/1.73

## 2024-06-01 LAB — LIPID PANEL
Chol/HDL Ratio: 3.2 ratio (ref 0.0–4.4)
Cholesterol, Total: 174 mg/dL (ref 100–199)
HDL: 55 mg/dL
LDL Chol Calc (NIH): 106 mg/dL — ABNORMAL HIGH (ref 0–99)
Triglycerides: 69 mg/dL (ref 0–149)
VLDL Cholesterol Cal: 13 mg/dL (ref 5–40)

## 2024-06-01 LAB — LIPOPROTEIN A (LPA): Lipoprotein (a): 209.2 nmol/L — AB

## 2024-06-01 LAB — HEMOGLOBIN A1C
Est. average glucose Bld gHb Est-mCnc: 128 mg/dL
Hgb A1c MFr Bld: 6.1 % — ABNORMAL HIGH (ref 4.8–5.6)

## 2024-06-13 ENCOUNTER — Other Ambulatory Visit: Payer: Self-pay

## 2024-06-13 DIAGNOSIS — E7841 Elevated Lipoprotein(a): Secondary | ICD-10-CM

## 2024-06-13 DIAGNOSIS — E782 Mixed hyperlipidemia: Secondary | ICD-10-CM

## 2024-06-14 ENCOUNTER — Other Ambulatory Visit: Payer: Self-pay

## 2024-06-14 DIAGNOSIS — E7841 Elevated Lipoprotein(a): Secondary | ICD-10-CM

## 2024-06-14 DIAGNOSIS — E782 Mixed hyperlipidemia: Secondary | ICD-10-CM

## 2024-06-17 ENCOUNTER — Ambulatory Visit: Admitting: Nurse Practitioner

## 2024-07-01 ENCOUNTER — Other Ambulatory Visit (HOSPITAL_BASED_OUTPATIENT_CLINIC_OR_DEPARTMENT_OTHER)

## 2024-10-17 ENCOUNTER — Encounter: Payer: Self-pay | Admitting: Nurse Practitioner

## 2024-11-28 ENCOUNTER — Encounter: Admitting: Nurse Practitioner
